# Patient Record
Sex: Female | Born: 1965 | Race: White | Hispanic: No | Marital: Married | Smoking: Never smoker
Health system: Southern US, Community
[De-identification: ages and names within clinical notes are randomized; demographics above are authoritative.]

## PROBLEM LIST (undated history)

## (undated) DIAGNOSIS — D649 Anemia, unspecified: Secondary | ICD-10-CM

## (undated) DIAGNOSIS — R011 Cardiac murmur, unspecified: Secondary | ICD-10-CM

## (undated) DIAGNOSIS — Z8679 Personal history of other diseases of the circulatory system: Secondary | ICD-10-CM

## (undated) DIAGNOSIS — Z87440 Personal history of urinary (tract) infections: Secondary | ICD-10-CM

## (undated) DIAGNOSIS — Z332 Encounter for elective termination of pregnancy: Secondary | ICD-10-CM

## (undated) DIAGNOSIS — O021 Missed abortion: Secondary | ICD-10-CM

## (undated) DIAGNOSIS — I10 Essential (primary) hypertension: Secondary | ICD-10-CM

## (undated) DIAGNOSIS — E039 Hypothyroidism, unspecified: Secondary | ICD-10-CM

## (undated) DIAGNOSIS — I7789 Other specified disorders of arteries and arterioles: Secondary | ICD-10-CM

## (undated) HISTORY — PX: DILATION AND CURETTAGE OF UTERUS: SHX78

## (undated) HISTORY — DX: Personal history of urinary (tract) infections: Z87.440

## (undated) HISTORY — DX: Hypothyroidism, unspecified: E03.9

## (undated) HISTORY — DX: Essential (primary) hypertension: I10

## (undated) HISTORY — DX: Encounter for elective termination of pregnancy: Z33.2

## (undated) HISTORY — DX: Other specified disorders of arteries and arterioles: I77.89

## (undated) HISTORY — DX: Anemia, unspecified: D64.9

## (undated) HISTORY — DX: Missed abortion: O02.1

## (undated) HISTORY — PX: UPPER GASTROINTESTINAL ENDOSCOPY: SHX188

## (undated) HISTORY — DX: Cardiac murmur, unspecified: R01.1

## (undated) HISTORY — PX: ENDOMETRIAL ABLATION: SHX621

## (undated) HISTORY — DX: Personal history of other diseases of the circulatory system: Z86.79

## (undated) HISTORY — PX: ENDOSCOPIC VEIN LASER TREATMENT: SHX1508

## (undated) HISTORY — PX: COLONOSCOPY: SHX174

---

## 2001-05-15 ENCOUNTER — Other Ambulatory Visit: Admission: RE | Admit: 2001-05-15 | Discharge: 2001-05-15 | Payer: Self-pay | Admitting: Gynecology

## 2002-06-25 ENCOUNTER — Encounter (INDEPENDENT_AMBULATORY_CARE_PROVIDER_SITE_OTHER): Payer: Self-pay | Admitting: Specialist

## 2002-06-25 ENCOUNTER — Ambulatory Visit (HOSPITAL_COMMUNITY): Admission: RE | Admit: 2002-06-25 | Discharge: 2002-06-25 | Payer: Self-pay | Admitting: Gynecology

## 2003-01-22 ENCOUNTER — Other Ambulatory Visit: Admission: RE | Admit: 2003-01-22 | Discharge: 2003-01-22 | Payer: Self-pay | Admitting: Gynecology

## 2003-08-04 ENCOUNTER — Encounter (INDEPENDENT_AMBULATORY_CARE_PROVIDER_SITE_OTHER): Payer: Self-pay | Admitting: *Deleted

## 2003-08-04 ENCOUNTER — Inpatient Hospital Stay (HOSPITAL_COMMUNITY): Admission: RE | Admit: 2003-08-04 | Discharge: 2003-08-08 | Payer: Self-pay | Admitting: Gynecology

## 2003-09-17 ENCOUNTER — Other Ambulatory Visit: Admission: RE | Admit: 2003-09-17 | Discharge: 2003-09-17 | Payer: Self-pay | Admitting: Gynecology

## 2004-07-11 DIAGNOSIS — Z8679 Personal history of other diseases of the circulatory system: Secondary | ICD-10-CM

## 2004-07-11 DIAGNOSIS — Z87440 Personal history of urinary (tract) infections: Secondary | ICD-10-CM

## 2004-07-11 HISTORY — DX: Personal history of other diseases of the circulatory system: Z86.79

## 2004-07-11 HISTORY — PX: OTHER SURGICAL HISTORY: SHX169

## 2004-07-11 HISTORY — DX: Personal history of urinary (tract) infections: Z87.440

## 2004-11-09 ENCOUNTER — Other Ambulatory Visit: Admission: RE | Admit: 2004-11-09 | Discharge: 2004-11-09 | Payer: Self-pay | Admitting: Gynecology

## 2005-03-30 ENCOUNTER — Ambulatory Visit (HOSPITAL_BASED_OUTPATIENT_CLINIC_OR_DEPARTMENT_OTHER): Admission: RE | Admit: 2005-03-30 | Discharge: 2005-03-30 | Payer: Self-pay | Admitting: Gynecology

## 2006-03-17 ENCOUNTER — Other Ambulatory Visit: Admission: RE | Admit: 2006-03-17 | Discharge: 2006-03-17 | Payer: Self-pay | Admitting: Gynecology

## 2007-05-08 ENCOUNTER — Other Ambulatory Visit: Admission: RE | Admit: 2007-05-08 | Discharge: 2007-05-08 | Payer: Self-pay | Admitting: Gynecology

## 2008-08-14 ENCOUNTER — Other Ambulatory Visit: Admission: RE | Admit: 2008-08-14 | Discharge: 2008-08-14 | Payer: Self-pay | Admitting: Gynecology

## 2008-08-14 ENCOUNTER — Ambulatory Visit: Payer: Self-pay | Admitting: Gynecology

## 2008-08-14 ENCOUNTER — Encounter: Payer: Self-pay | Admitting: Gynecology

## 2010-01-07 ENCOUNTER — Other Ambulatory Visit: Admission: RE | Admit: 2010-01-07 | Discharge: 2010-01-07 | Payer: Self-pay | Admitting: Gynecology

## 2010-01-07 ENCOUNTER — Ambulatory Visit: Payer: Self-pay | Admitting: Gynecology

## 2010-11-26 NOTE — H&P (Signed)
NAMEGLENYCE, RANDLE                           ACCOUNT NO.:  [id number]   MEDICAL RECORD NO.:  [id number]                   PATIENT TYPE:   LOCATION:                                       FACILITY:   PHYSICIAN:  Juan H. Lily Peer, M.D.             DATE OF BIRTH:   DATE OF ADMISSION:  06/25/2002  DATE OF DISCHARGE:                                HISTORY & PHYSICAL   CHIEF COMPLAINT:  Vaginal bleeding.   HISTORY:  The patient is a 45 year old gravida 2, para 1 whose last  menstrual period was reportedly March 13, 2002; and currently would be 14-  1/[redacted] weeks gestation.  Estimated date of confinement of December 19, 2002.  The  patient has had several ultrasounds and quantitative beta hCGs since the  commencement of this pregnancy.   The first ultrasound was done on May 09, 2002.  There was a questionable  echogenic area seen in the right adnexa, suspicious for an ectopic (very  early) and by her last menstrual period she would have been eight weeks into  her pregnancy at that point.  She had a follow-up ultrasound on May 17, 2002; at which she would have been nine weeks and two days by the last  menstrual period.  Amniotic fluid was reported to be normal.  The cervix was  long and closed.  The gestational sac was visualized, consistent with less  than 5 weeks, and no fetal pole was seen.  A small amount of fluid was still  seen around the ovaries.  Her follow-up ultrasound after that was on  June 14, 2002 and was screened for viability.  At this point crown rump  rent was indicated of a six-week, three-day pregnancy with a yolk sac seen;  but no cardiac activity.  Too early progressive changes were seen in  comparison to May 17, 2002 scanned.  Today she presented complaining of  some vaginal bleeding for the past  2-3 days, and the crown rump rent was 6.4 mm, consistent with six weeks and  four days and no cardiac activity.  No yolk sac was visualized today.  The  gestational sac was consistent with seven weeks and one day.  All of this is  consistent with evidence of a fetal demise.   Her quantitative beta HCG's had also been followed serially, and had been  going up  -- with one done on May 08, 2002, which was 148.  On May 10, 2002 it went up to 487.  On May 14, 2002 it went up to 5,676; and on  December 5,  57, 218 million International Units per milliliter.   The patient was recently seen for an upper respiratory tract infection.  A  strep culture was negative.  She is currently on a Z-Pak.   PAST MEDICAL HISTORY:  1. She has had a history of hypothyroidism; has been on Synthroid 0.075  mcg     -- followed by Dr. Guadelupe Sabin.  Recent thyroid function tests,     according to the patient, have been within the normal range.  2. She has had one cesarean section in the past.   FAMILY HISTORY:  Father with hypertension.  The patient denies any other  medical problems.   PHYSICAL EXAMINATION:  GENERAL:  A well developed, well nourished female.  HEENT:  Unremarkable.  NECK:  Supple.  Trachea midline.  No carotid bruits.  No thyromegaly.  LUNGS:  Clear to auscultation; no rhonchi or wheezes.  HEART:  Regular rate and rhythm; no murmurs or gallops.  BREASTS:  Not done.  ABDOMEN:  Soft and nontender; without rebound or guarding.  PELVIC:  Os and ureters were within normal limits.  Vagina with evidence of  some dark red blood noted in the vaginal vault. Cervix was closed.  No  active bleeding noted.  Uterus approximately 8-10 week size.  No palpable  adnexal masses.  RECTAL:  Not done.   ASSESSMENT:  A 45 year old gravida 2, para 1 with evidence of first  trimester AB; although progressively increasing quantitative beta HCG  with  the amount of bleeding today and no evidence ever of cardiac activity, with  gestational sac consistent with seven weeks, all this indicative of fetal  demise.  The patient will undergo a D&C tomorrow at  [facility].  Risks, benefits pros and cons of the procedure were discussed, including  infection, bleeding, perforation and instrumentation, or trauma to the  internal organs were discussed.  In the event of uncontrollable hemorrhage,  or should she need a blood transfusion potential risks are: anaphylactic  reaction, hepatitis and AIDS.   BLOOD TYPE:  O positive.   DISPOSITION:  All questions are answered and we will follow accordingly.   PLAN:  The patient is scheduled for [facility] tomorrow, June 25, 2002 at  [facility].                                               [place] H. Lily Peer, M.D.    JHF/MEDQ  D:  06/24/2002  T:  06/24/2002  Job:  [id number]

## 2010-11-26 NOTE — Op Note (Signed)
   NAME:  Erica Hull, Erica Hull                         ACCOUNT NO.:  [id number]   MEDICAL RECORD NO.:  [id number]                   PATIENT TYPE:  AMB   LOCATION:  SDC                                  FACILITY:  [facility]   PHYSICIAN:  Juan H. Lily Peer, M.D.             DATE OF BIRTH:  1966-02-08   DATE OF PROCEDURE:  DATE OF DISCHARGE:                                 OPERATIVE REPORT   PREOPERATIVE DIAGNOSIS:  First trimester missed abortion.   POSTOPERATIVE DIAGNOSIS:  First trimester missed abortion.   PROCEDURE:  Dilatation and evacuation.   SURGEON:  Juan H. Lily Peer, M.D.   ANESTHESIA:  Spinal.   INDICATIONS:  The patient is a 45 year old gravida 2, para 1 with first  trimester missed AB.   DESCRIPTION OF PROCEDURE:  After the patient was adequately counseled, she  was taken to the operating room, where she successfully underwent a spinal  anesthesia.  Her vagina and perineum were prepped and draped in the usual  sterile fashion.  Examination under anesthesia demonstrated 10-12 week size  uterus with no palpable adnexal masses.  The bladder was evacuated of its  contents of approximately 50 cc with a red rubber Roxan Hockey.  Next, 2% Xylocaine with 1:100,000 epinephrine was utilized for paracervical  block.  The lidocaine was infiltrated into the 2, 4 8 and 10 o'clock  positions for a total of 10 cc.  The cervix required minimal dilatation.  A  10 mm curet was introduced into the intrauterine cavity, and the  intrauterine cavity was suction curettaged.  It was then changed with a  blunt curet to make sure all that all retained products of conception were  removed.  The single-tooth tenaculum was removed, and the patient was  transferred to recovery with staple vital signs.  She did receive 30 mg of  Toradol on her route to the recovery room.  Blood loss was less than 50 cc  and IV fluids was 500 cc of lactated Ringers.  She did receive prophylactic  antibiotics of 1 g IV, and her  blood type is O positive.                                               Juan H. Lily Peer, M.D.    JHF/MEDQ  D:  06/25/2002  T:  06/25/2002  Job:  [id number]

## 2010-11-26 NOTE — Op Note (Signed)
Erica Hull, Erica Hull               ACCOUNT NO.:  [id number]   MEDICAL RECORD NO.:  [id number]          PATIENT TYPE:  AMB   LOCATION:  N[facility]C                         [facility]ACILITY:  [facility]   PHYSICIAN:  Juan H. Lily Peer, M.D.DATE OF BIRTH:  10-22-1965   DATE OF PROCEDURE:  03/30/2005  DATE OF DISCHARGE:                                 OPERATIVE REPORT   SURGEON:  Juan H. Lily Peer, M.D.   INDICATIONS FOR OPERATION:  A 45 year old, gravida 5, para 2, AB 3 who is  scheduled to undergo endometrial ablation secondary to dysmenorrhea and  cramping during menses. Patient with prior endometrial biopsy being benign  and also patient with prior tubal sterilization procedure.   PREOPERATIVE DIAGNOSIS:  Menorrhagia.   POSTOPERATIVE DIAGNOSIS:  Menorrhagia.   ANESTHESIA:  General endotracheal anesthesia.   PROCEDURE PERFORMED:  Hydrothermal ablation/endometrial ablation technique.   DESCRIPTION OF OPERATION:  After the patient was adequately counseled, she  was taken to the operating room where she underwent a successful general  endotracheal anesthesia. She was placed in low lithotomy position. The  vagina and perineum were prepped and draped in the usual sterile fashion. A  red rubber Roxan Hockey was inserted to evacuate the bladder of its contents for  approximately 50 mL. Exam under anesthesia demonstrated slightly anteverted  uterus normal size and no palpable adnexal masses. A speculum was inserted  into the vaginal vault, single tooth tenaculum was placed onto the anterior  cervical lip. The uterus sounded to approximately 7 cm. Pratt dilators to a  size 23 were utilized to dilate the cervix in an effort to allow the [place]  scientific hydrothermal ablation/hysteroscopic instrumentation to be  introduced into the endocervical canal and into the endometrial cavity. The  first 3 minutes was a clearing phase removing any lush endometrium from the  cavity. Both tubal ostia were identified as well  as the cervical canal. She  did have a very small myoma impinging into the cavity of the right cornual  region. The temperature was raised to 90 degrees for 10 minutes to complete  the ablation and this was followed by a 3-minute cooling phase. Pre and post  ablation pictures were obtained. A set or copy will be kept in the patient's  record here at [facility] and a second set will go to the  patient's record at [facility]. The patient did  receive a gram of cefotetan for prophylaxis. Although she denied any  allergies, she appeared to have a mild rash. She was given Benadryl 25  milligrams IV and will monitor closely in the recovery room. No other  complications noted and she is given 30 milligrams of Toradol on route to  the recovery room.      Juan H. Lily Peer, M.D.  Electronically Signed    JHF/MEDQ  D:  03/30/2005  T:  03/30/2005  Job:  [id number]

## 2010-11-26 NOTE — H&P (Signed)
Erica Hull, Erica Hull               ACCOUNT NO.:  [id number]   MEDICAL RECORD NO.:  [id number]          PATIENT TYPE:  AMB   LOCATION:  NESC                         FACILITY:  [facility]   PHYSICIAN:  Juan H. Lily Peer, M.D.DATE OF BIRTH:  [birth year]   DATE OF ADMISSION:  03/30/2005  DATE OF DISCHARGE:                                HISTORY & PHYSICAL   CHIEF COMPLAINT:  Menorrhagia.   HISTORY:  Patient is a 45 year old gravida 5, para 2, AB 3 who was seen in  the office on September 14th for preoperative consultation.  The patient is  scheduled to undergo an endometrial ablation on Wednesday, September 20th at  1 p.m. due to her history of menorrhagia lasting 7 or more days with a  significant amount of cramping and passing large quantities of blood clots.  Patient had undergone an endometrial biopsy in the office on September 14th,  which the pathological report was __________ secretory-type endometrium.  Her recent Pap smear on Nov 14, 2004 was normal.  Since she has a history of  hypothyroidism, TSH was checked as well as were cholesterol, urinalysis, and  CBC, which were all normal.  Her platelet count was 170,000.  Patient has a  history of bilateral tubal sterilization procedure at the time of her last  cesarean section.   PAST MEDICAL HISTORY:  Patient denies any allergies.  She has had two  elective abortions, two cesarean sections, and one missed AB.  She has a  history of hypothyroidism for which she is on Synthroid and has been  followed by Dr. Kirby Funk.   FAMILY HISTORY:  Significant for hypertension in her father.   PHYSICAL EXAMINATION:  VITAL SIGNS:  Patient weighs 166 pounds.  Height 5  feet 6 inches tall.  Blood pressure 110/78, pulse 76.  HEENT:  Unremarkable.  NECK:  Supple.  Trachea is midline.  No carotid bruits.  No thyromegaly.  LUNGS:  Clear to auscultation without rhonchi or wheezes.  HEART:  Regular rate and rhythm.  No murmurs or gallops.  BREASTS:  Not  done.  ABDOMEN:  Soft and nontender without rebound or guarding.  PELVIC:  Bartholin's, urethra, and Skene's glands within normal limits.  Vagina and cervix:  No gross lesions on inspection.  Uterus:  Anteverted,  normal size, shape, and consistency.  Adnexa without masses or tenderness.  RECTAL:  Not done.   ASSESSMENT:  A 45 year old gravida 5, para 2, AB 3 with severe menorrhagia  and cramping.  Previous tubal ligation and recent endometrial biopsy being  benign.  Normal platelets.  Normal hemoglobin and hematocrit.  Patient has  been offered different treatment modalities, and she would like to proceed  with endometrial ablation.  She had been provided with literature and  information at the time of her annual examination on May 2nd, and then we  discussed it on September 14th when she presented for the preoperative  consultation.  The risks, benefits, pro's and con's of the procedure were  discussed with the patient in detail, despite having done endometrial biopsy  and a recent normal Pap smear, there  is always the remote possibility there  could be some remote focus of cancer developing that could be ablating and  not developed until later, but as far as the endometrial biopsy, it was  benign.  The patient is  fully aware of that small risk.  All questions are answered.  The patient  has received Provera 10 mg x10 days prior to her surgery.  All questions are  answered and will follow accordingly.  Patient scheduled for hydrothermal  ablation on Wednesday, September 20th at 1 p.m. for her menometrorrhagia at  [facility].      Juan H. Lily Peer, M.D.  Electronically Signed     JHF/MEDQ  D:  03/29/2005  T:  03/29/2005  Job:  [id number]

## 2010-11-26 NOTE — H&P (Signed)
NAME:  ANANDA, CAYA                         ACCOUNT NO.:  [id number]   MEDICAL RECORD NO.:  [id number]                   PATIENT TYPE:  INP   LOCATION:  NA                                   FACILITY:  [facility]   PHYSICIAN:  Juan H. Lily Peer, M.D.             DATE OF BIRTH:  1966-03-30   DATE OF ADMISSION:  08/04/2003  DATE OF DISCHARGE:                                HISTORY & PHYSICAL   CHIEF COMPLAINT:  Term intrauterine pregnancy at 38-1/2 weeks estimated  gestational age, previous history of cesarean section for elective repeat,  and request for elective permanent sterilization.  Also a history of  hypothyroidism.   HISTORY OF PRESENT ILLNESS:  The patient is a 45 year old gravida 5, para 1,  abortus 3, who will be 38-1/2 to [redacted] weeks gestation at the time of her  cesarean section on Monday, August 04, 2003.  The patient's prenatal course  has been significant for the fact that due to advanced maternal age, she had  undergone a genetic amniocentesis at approximately [redacted] weeks gestation and  had a normal female chromosome of 64 XX and normal amniotic fluid and alpha  fetoprotein.  She also had a normal cystic fibrosis screen and a negative  GBS culture and she had been tested for her TSH secondary to her  hypothyroidism at approximately 28 weeks which was in the normal range, but  she has continued on the same dose of 75 mcg daily.   PAST MEDICAL HISTORY:  She denies any allergies.  Has history of  hypothyroidism.  Advanced maternal age.  Previous cesarean section secondary  to breech in 1998.  She says she had labile pregnancy-induced hypertension  in the last pregnancy and history of hypothyroidism on Synthroid 75 mcg  daily.  She has had D&C's in the past x3 and one cesarean section.   REVIEW OF SYSTEMS:  See [place] form.   PHYSICAL EXAMINATION:  VITAL SIGNS:  Blood pressure 120/78, urine trace  protein and negative glucose, weight 205 pounds.  HEENT:  Unremarkable.  NECK:  Supple.  Trachea midline.  No carotid bruits and no thyromegaly.  LUNGS:  Clear to auscultation without rhonchi or wheezes.  HEART:  Regular rate and rhythm with no murmurs or gallops.  BREASTS:  Done during the first prenatal visit was reported to be normal.  ABDOMEN:  Gravid uterus, vertex presentation by Thayer Ohm maneuver.  Positive  fetal heart tones.  PELVIC:  Cervix was long, closed, and posterior.   PRENATAL LABORATORY DATA:  O positive blood type, negative antibody screen,  VDRL nonreactive, rubella immune.  Hepatitis B surface antigen and HIV were  negative.  Diabetes screen was normal.  Amniotic fluid alpha fetoprotein was  normal.  Genetic amniocentesis 69 XX and GBS culture was negative.   ASSESSMENT:  A 45 year old gravida 5, para 1, abortus 3, at 38-1/2 to [redacted]  weeks gestation with previous cesarean section, requesting  elective repeat.  Also the patient is requesting elective permanent sterilization.  The risks,  benefits, pros, and cons of repeat cesarean section versus trial of labor  have previously been discussed and an ACOG guidelines has been discussed as  well.  The patient had voiced a request for elective sterilization for which  she was counseled as to 1 in 600 to 1 in 1000 failure rates.  The patient is  fully aware and would like to proceed with it.  Although, she is fully aware  that this is a permanent form of sterilization and will not be able to have  anymore children.  Both procedures were discussed.  Additional risks  including infection, bleeding, trauma to internal organs, need for blood and  blood products with the potential risk of anaphylactic reaction, HIV, and  hepatitis were also discussed.  She will receive prophylactic antibiotics  immediately after the cesarean section.  All of these issues were discussed,  all questions were answered and will follow accordingly.   PLAN:  The patient is scheduled for repeat cesarean section with bilateral   tubal ligation procedure on Monday January 24, at 1 p.m. at [facility].                                               [place] H. Lily Peer, M.D.    JHF/MEDQ  D:  07/31/2003  T:  07/31/2003  Job:  [id number]

## 2010-11-26 NOTE — Discharge Summary (Signed)
NAME:  Erica Hull, Erica Hull                         ACCOUNT NO.:  [id number]   MEDICAL RECORD NO.:  [id number]                   PATIENT TYPE:  INP   LOCATION:  9135                                 FACILITY:  [facility]   PHYSICIAN:  Juan H. Lily Peer, M.D.             DATE OF BIRTH:  04/10/1966   DATE OF ADMISSION:  08/04/2003  DATE OF DISCHARGE:  08/08/2003                                 DISCHARGE SUMMARY   DISCHARGE DIAGNOSES:  1. 38+ weeks delivery.  2. History of prior Cesarean section.  3. Desired repeat Cesarean section with request for elective permanent     sterilization.  4. Advanced maternal age.  5. Hyperthyroidism.  6. Status post repeat lower uterine segment transverse Cesarean section and     bilateral tubal sterilization procedure, Pomeroy technique, by Dr. Reynaldo Minium on August 04, 2003.   HISTORY:  This is a 45 year old female, gravida 5, para 1, abortus 3, with  an EDC of August 18, 2003. Prenatal course was complicated by advanced  maternal age. She underwent genetic amniocentesis which was normal. She also  has hyperthyroidism, on Synthroid. The patient has a history of prior  Cesarean section, desired repeat Cesarean section, and desired attempted  permanent sterilization.   HOSPITAL COURSE:  On August 04, 2003, the patient was admitted and  underwent a repeat lower uterine segment transverse Cesarean section,  bilateral tubal sterilization, by Pomeroy technique by Dr. Reynaldo Minium  without complications with delivery of female with Apgars 9 and 9, weight 6  pounds 15 ounces. There were no complications. Postoperatively, the patient  remained afebrile, voiding, and in stable condition. She was discharged to  home in satisfactory condition on August 08, 2003. It was noted that the  baby had been transferred to NICU, however, for pneumonia and was stable at  the time of the patient's discharge.   CLINICAL FINDINGS AND LABORATORY:  The patient is O positive,  Rubella  immune. On August 05, 2003, hemoglobin was 11.3.   DISPOSITION:  The patient is discharged to home, return to the office in six  weeks; to be seen sooner if any problems.  The patient is given a  prescription for Tylox p.r.n. pain.     Susa Loffler, P.A.                    Juan H. Lily Peer, M.D.    TSG/MEDQ  D:  09/15/2003  T:  09/15/2003  Job:  [id number]

## 2010-11-26 NOTE — Op Note (Signed)
NAME:  Erica Hull, Erica Hull                         ACCOUNT NO.:  [id number]   MEDICAL RECORD NO.:  [id number]                   PATIENT TYPE:  INP   LOCATION:  9198                                 FACILITY:  [facility]   PHYSICIAN:  Juan H. Lily Peer, M.D.             DATE OF BIRTH:  13-Jun-1966   DATE OF PROCEDURE:  08/04/2003  DATE OF DISCHARGE:                                 OPERATIVE REPORT   PREOPERATIVE DIAGNOSES:  1. Term intrauterine pregnancy.  2. Previous cesarean section, requesting elective repeat.  3. Request for elective permanent sterilization.   POSTOPERATIVE DIAGNOSES:  1. Term intrauterine pregnancy.  2. Previous cesarean section, requesting elective repeat.  3. Request for elective permanent sterilization.   SURGEON:  Juan H. Lily Peer, M.D.   FIRST ASSISTANT:  Timothy P. Fontaine, M.D.   ANESTHESIA:  Spinal.   PROCEDURES PERFORMED:  1. Repeat lower uterine segment transverse cesarean section.  2. Bilateral tubal sterilization procedure, Pomeroy technique.   INDICATIONS FOR PROCEDURE:  A 45 year old gravida 5, para 1, AB3 at 38-1/2  weeks estimated gestational age with previous cesarean section requesting  elective repeat.  The patient also requesting elective permanent  sterilization.   FINDINGS:  A viable female infant.  Apgars are 9 and 9 with a weight of 6  pounds 15 ounces, normal maternal pelvic anatomy.  Clear amniotic fluid.  Arterial cord pH was 7.26.   DESCRIPTION OF PROCEDURE:  After the patient was adequately counseled, she  was taken to the operating room where she underwent a successful spinal  placement.  The abdomen was prepped and draped in the usual sterile fashion.  A Foley catheter was inserted in an effort to monitor urinary output.  The  Pfannenstiel skin incision was made adjacent to the previous Pfannenstiel  scar.  The incision was carried out from the skin and subcutaneous tissue  down to the rectus fascia whereby midline nick was made.   The fascia was  incised in a transverse fashion.  The peritoneal cavity was entered  cautiously.  The bladder flap was established.  The lower uterine segment  was incised in a transversalis fascia.  Clear amniotic fluid was present.  The newborn's head was delivered with the assistance of a Mityvac vacuum  with minimal traction to assist getting the head through the small uterine  incision.  The nasopharyngeal area was bulb suctioned.  The newborn was  delivered and gave immediate cry.  The cord was doubly clamped and excised  and passed off to the pediatricians who were in attendance with the above  mentioned parameters.  Cord blood was obtained and the placenta was  delivered from the intrauterine cavity after the cord blood was obtained and  passed off the operative field.  The Pitocin drip was started.  The patient  received a gram of Cefotan IV.  The uterus was then exteriorized.  The  intrauterine cavity was swept clear of  remaining products of conception.  The lower uterine segment transverse incision was closed with a running  locking stitch of 0 Vicryl  suture.  Attention was then placed to the  proximal one third portion of the right fallopian tube which was placed in  traction with a Babcock clamp.  A 2 cm segment was suture ligated with 1-0  Vicryl suture x2 and a 2 cm segment was transected and passed off the  operative field for histologic evaluation.  the remaining stumps were Bovie  cauterized.  Similar procedure was carried out on the contralateral side.  The uterus was placed back into the pelvic cavity.  The pelvic cavity was  copiously irrigated with normal saline solution.  After ascertaining  adequate hemostasis, the closure was commenced.  Of note, the visceral  peritoneum was not reapproximated. The fascia was closed with a running  locking stitch of 0 Vicryl suture. The subcutaneous bleeders were Bovie  cauterized.  The skin was reapproximated with skin clips  followed by  placement of Xeroform gauze and 4x dressing.  The patient was transferred to  the recovery room with stable vital signs.  Blood loss for the procedure was  1000 mL.  IV fluids were 400 mL of lactated Ringer's. Urine output was 125  mL and she had received 1 g of Cefotan prophylactically.                                               Juan H. Lily Peer, M.D.    JHF/MEDQ  D:  08/04/2003  T:  08/04/2003  Job:  [id number]

## 2011-03-11 ENCOUNTER — Encounter: Payer: Self-pay | Admitting: Gynecology

## 2011-04-13 ENCOUNTER — Other Ambulatory Visit: Payer: Self-pay | Admitting: Gynecology

## 2011-04-20 ENCOUNTER — Encounter: Payer: Self-pay | Admitting: Gynecology

## 2011-04-27 ENCOUNTER — Encounter: Payer: Self-pay | Admitting: Gynecology

## 2011-05-13 ENCOUNTER — Encounter: Payer: Self-pay | Admitting: Gynecology

## 2011-05-20 ENCOUNTER — Encounter: Payer: Self-pay | Admitting: Gynecology

## 2011-05-20 ENCOUNTER — Other Ambulatory Visit (HOSPITAL_COMMUNITY)
Admission: RE | Admit: 2011-05-20 | Discharge: 2011-05-20 | Disposition: A | Discharge status: Home / Self Care | Payer: Managed Care, Other (non HMO) | Source: Ambulatory Visit | Attending: Gynecology | Admitting: Gynecology

## 2011-05-20 ENCOUNTER — Ambulatory Visit (INDEPENDENT_AMBULATORY_CARE_PROVIDER_SITE_OTHER): Payer: Managed Care, Other (non HMO) | Admitting: Gynecology

## 2011-05-20 VITALS — BP 124/82 | Ht 66.0 in | Wt 190.0 lb

## 2011-05-20 DIAGNOSIS — N920 Excessive and frequent menstruation with regular cycle: Secondary | ICD-10-CM

## 2011-05-20 DIAGNOSIS — N946 Dysmenorrhea, unspecified: Secondary | ICD-10-CM

## 2011-05-20 DIAGNOSIS — Z01419 Encounter for gynecological examination (general) (routine) without abnormal findings: Secondary | ICD-10-CM | POA: Insufficient documentation

## 2011-05-20 DIAGNOSIS — R635 Abnormal weight gain: Secondary | ICD-10-CM

## 2011-05-20 DIAGNOSIS — E039 Hypothyroidism, unspecified: Secondary | ICD-10-CM

## 2011-05-20 NOTE — Progress Notes (Signed)
Erica Hull Apr 21, 1966 [id number]   History:    45 y.o.  for annual exam with complaint of menorrhagia her cycles lasting 7-8 days and very heavy the first few days. Review of her record indicated she's had a bilateral tubal sterilization procedure in the past and 2006 she had a hydrothermal ablation. Her mammogram was in August of this year was normal she doesn't monthly self breast examination.  Past medical history,surgical history, family history and social history were all reviewed and documented in the EPIC chart.  Gynecologic History Patient's last menstrual period was 05/04/2011. Contraception: tubal ligation Last Pap: 2011. Results were:normal} Last mammogram: 2012. Results were: normal  Obstetric History OB History    Grav Para Term Preterm Abortions TAB SAB Ect Mult Living   4 2 2  2     2      # Outc Date GA Lbr Len/2nd Wgt Sex Del Anes PTL Lv   1 TRM     M CS  No Yes   2 TRM     F CS  No Yes   3 ABT            4 ABT                ROS:  Was performed and pertinent positives and negatives are included in the history.  Exam: chaperone present  BP 124/82  Ht 5\' 6"  (1.676 m)  Wt 190 lb (86.183 kg)  BMI 30.67 kg/m2  LMP 05/04/2011  Body mass index is 30.67 kg/(m^2).  General appearance : Well developed well nourished female. No acute distress HEENT: Neck supple, trachea midline, no carotid bruits, no thyroidmegaly Lungs: Clear to auscultation, no rhonchi or wheezes, or rib retractions  Heart: Regular rate and rhythm, no murmurs or gallops Breast:Examined in sitting and supine position were symmetrical in appearance, no palpable masses or tenderness,  no skin retraction, no nipple inversion, no nipple discharge, no skin discoloration, no axillary or supraclavicular lymphadenopathy Abdomen: no palpable masses or tenderness, no rebound or guarding Extremities: no edema or skin discoloration or tenderness  Pelvic:  Bartholin, Urethra, Skene Glands: Within normal  limits             Vagina: No gross lesions or discharge  Cervix: No gross lesions or discharge  Uterus  anteverted, normal size, shape and consistency, non-tender and mobile  Adnexa  Without masses or tenderness  Anus and perineum  normal   Rectovaginal  normal sphincter tone without palpated masses or tenderness             Hemoccult not done     Assessment/Plan:  45 y.o. female for annual exam with complaint of menorrhagia lasting 7 days for a few days very heavy. Patient with prior hydrothermal ablation. Patient will return to the office next week we can do of sonohysterogram if her intrauterine cavity is not agglutinated we may want to consider an in office her option endometrial ablation. Literature formation was provided. If this is not obtainable general options would be hormone manipulation or hysterectomy. Her Pap smear was done today along with CBC cholesterol random blood sugar TSH and UA. She's currently on Synthroid 75 mcg daily. We'll see her next week and plan a course of management accordingly.    Ok Edwards MD, 3:18 PM 05/20/2011

## 2011-05-25 ENCOUNTER — Telehealth: Payer: Self-pay

## 2011-05-25 NOTE — Telephone Encounter (Signed)
Patient is scheduled for [facility] 11/20. She asked if Dr. Glenetta Hew recommends hysterectomy would she be able to get it done before Jan 1.  I told her I thought we would be able to fit her in in December.  She asked about recovery for TLH. I told her 2 weeks optimal with sedentary job and 4 weeks possible.  She works from home on computer and could pace herself and I told her two weeks was likely.  I told her if Vag Hyst it may be more like 4 week recovery but she could still start doing some things at home toward working at 2 weeks if she paces herself carefully.  ka

## 2011-05-31 ENCOUNTER — Other Ambulatory Visit: Payer: Self-pay | Admitting: Gynecology

## 2011-05-31 ENCOUNTER — Ambulatory Visit (INDEPENDENT_AMBULATORY_CARE_PROVIDER_SITE_OTHER): Payer: Managed Care, Other (non HMO) | Admitting: Gynecology

## 2011-05-31 ENCOUNTER — Ambulatory Visit (INDEPENDENT_AMBULATORY_CARE_PROVIDER_SITE_OTHER): Payer: Managed Care, Other (non HMO)

## 2011-05-31 DIAGNOSIS — N920 Excessive and frequent menstruation with regular cycle: Secondary | ICD-10-CM

## 2011-05-31 DIAGNOSIS — N831 Corpus luteum cyst of ovary, unspecified side: Secondary | ICD-10-CM

## 2011-05-31 DIAGNOSIS — N83201 Unspecified ovarian cyst, right side: Secondary | ICD-10-CM

## 2011-05-31 DIAGNOSIS — N83209 Unspecified ovarian cyst, unspecified side: Secondary | ICD-10-CM

## 2011-05-31 DIAGNOSIS — D259 Leiomyoma of uterus, unspecified: Secondary | ICD-10-CM

## 2011-05-31 DIAGNOSIS — D251 Intramural leiomyoma of uterus: Secondary | ICD-10-CM

## 2011-05-31 HISTORY — DX: Intramural leiomyoma of uterus: D25.1

## 2011-05-31 NOTE — Progress Notes (Signed)
Patient presented to the office today for her evaluation of her menorrhagia. Patient states her cycles are lasting 7-8 days and very heavy for the first few days. Patient with prior history of bilateral tubal sterilization as well as having had a hydrothermal ablation 2006. She presented today for sonohysterogram to see if there is enough room in the cavity to allow to do the her option endometrial ablation were not to proceed with hysterectomy.  Ultrasound: Ultrasound with the following dimensions 11 x 8 x 6.8 cm with an endometrial stripe of 8.4 mm she had a fundal fibroid measured 44 x 31 mm and second fibroid measuring 25 x 23 mm and the third one measuring 19 x 17 mm. She had a small ovarian cyst measuring 24 x 21 x 20 mm on the right the left ovary was was normal. Sonohysterogram demonstrated no intracavitary defect and thin.  Finding discussed with Erica Hull and she would be a good candidate for a her option endometrial ablation here in the office. Literature formation was provided risks benefits and pros and cons were discussed. This was saver for hysterectomy. An endometrial biopsy was done today. Tissue submitted for histological evaluation. Will await after this period starts in place her on Prometrium 200 mg daily for 2 weeks before the planned procedure. We'll see her in the office the day before for preoperative counseling as well as for placement of laminaria intracervically.

## 2011-06-01 ENCOUNTER — Telehealth: Payer: Self-pay

## 2011-06-01 MED ORDER — PROGESTERONE MICRONIZED 200 MG PO CAPS: ORAL_CAPSULE | ORAL | Status: DC

## 2011-06-01 NOTE — Telephone Encounter (Signed)
Pt informed $25 copymt for Her Option Ablation in office. Patient expects menses any day now. We scheduled ablation for Wed Dec 5 10:00am. She will come in the afternoon before for laminaria/preop appt with Dr. Glenetta Hew.  She was instructed regarding full bladder am of procedure as well as someone needs to drive her to and from procedure.  She was instructed regarding beginning Prometrium on Day 5 of her cycle and that was e-scribed to her pharmacy for her.  All these dates and full bladder instructions also were mailed to patient.  She will call me if any questions.

## 2011-06-01 NOTE — Telephone Encounter (Signed)
I spoke with Erica Hull at [facility] today and she said Her Option Ablation CPT code 40981 is covered at 100% after a $25 copymt.  No precertification is required.  I called patient and left msg at work voice mail to call me regarding ins benefits/scheduling. ka

## 2011-06-09 ENCOUNTER — Other Ambulatory Visit: Payer: Self-pay | Admitting: Gynecology

## 2011-06-09 DIAGNOSIS — N920 Excessive and frequent menstruation with regular cycle: Secondary | ICD-10-CM

## 2011-06-14 ENCOUNTER — Encounter: Payer: Self-pay | Admitting: Gynecology

## 2011-06-14 ENCOUNTER — Ambulatory Visit (INDEPENDENT_AMBULATORY_CARE_PROVIDER_SITE_OTHER): Payer: Managed Care, Other (non HMO) | Admitting: Gynecology

## 2011-06-14 VITALS — BP 130/80

## 2011-06-14 DIAGNOSIS — Z01818 Encounter for other preprocedural examination: Secondary | ICD-10-CM

## 2011-06-14 DIAGNOSIS — N920 Excessive and frequent menstruation with regular cycle: Secondary | ICD-10-CM

## 2011-06-14 MED ORDER — AZITHROMYCIN 500 MG PO TABS: 500.0000 mg | ORAL_TABLET | Freq: Every day | ORAL | Status: AC

## 2011-06-14 MED ORDER — MISOPROSTOL 200 MCG PO TABS: 200.0000 ug | ORAL_TABLET | Freq: Once | ORAL | Status: DC

## 2011-06-14 MED ORDER — HYDROCODONE-ACETAMINOPHEN 5-500 MG PO TABS: 1.0000 | ORAL_TABLET | Freq: Four times a day (QID) | ORAL | Status: AC | PRN

## 2011-06-14 MED ORDER — DIAZEPAM 5 MG PO TABS: 5.0000 mg | ORAL_TABLET | Freq: Four times a day (QID) | ORAL | Status: AC | PRN

## 2011-06-14 NOTE — Patient Instructions (Signed)
You can go by the pharmacy to pick up the remaining prescriptions. Please follow the instructions provided prior to your procedure tomorrow. Remember your procedure scheduled for 10:30 and your to be here at 10 AM.

## 2011-06-14 NOTE — Progress Notes (Signed)
Patient presented to the office today for preoperative consultation in preparation for her office endometrial ablation scheduled for tomorrow. Patient with prior history of hydrothermal ablation in 2006. Recently patient been having periods lasting 10-12 days. Patient was seen the office November 20 see previous note outlining her recent ultrasound. Sonohysterogram had demonstrated no intracavitary defect and thin which would allow [place] to introduce a cryo-probe and offer her the cryo-ablation of the endometrium in the office. We had done an endometrial biopsy with the following result:  FINAL DIAGNOSIS Diagnosis Endometrium, biopsy - SECRETORY PATTERN ENDOMETRIUM. - NO HYPERPLASIA, ATYPIA, OR MALIGNANCY IDENTIFIED Patient with prior tubal sterilization procedure. Patient was counseled as to risk benefits and pros and cons of the operation. On pelvic exam her uterus is anteverted normal size shape and consistency with no palpable adnexal masses.  Prescription was provided for her as follows: Patient is to take Cytotec 200 mcg one by mouth this evening. She'll take Zithromax antibiotic 500 mg in the morning prior to come to the office for her procedure. She was also given a prescription of Valium 5 mg which she can take tonight if needed. She was also given a prescription of Vicodin to take 1 every 4-6 hours when necessary cramps after the procedure. Consent form was signed all questions were answered and we'll follow accordingly.

## 2011-06-15 ENCOUNTER — Ambulatory Visit: Payer: Managed Care, Other (non HMO) | Admitting: Gynecology

## 2011-06-15 ENCOUNTER — Other Ambulatory Visit: Payer: Managed Care, Other (non HMO)

## 2011-06-15 ENCOUNTER — Ambulatory Visit (INDEPENDENT_AMBULATORY_CARE_PROVIDER_SITE_OTHER): Payer: Managed Care, Other (non HMO) | Admitting: Gynecology

## 2011-06-15 ENCOUNTER — Encounter: Payer: Self-pay | Admitting: Gynecology

## 2011-06-15 ENCOUNTER — Ambulatory Visit: Payer: Managed Care, Other (non HMO)

## 2011-06-15 DIAGNOSIS — N920 Excessive and frequent menstruation with regular cycle: Secondary | ICD-10-CM

## 2011-06-15 DIAGNOSIS — N949 Unspecified condition associated with female genital organs and menstrual cycle: Secondary | ICD-10-CM

## 2011-06-15 MED ORDER — LIDOCAINE HCL 1 % IJ SOLN: 10.0000 mL | Freq: Once | INTRAMUSCULAR | Status: DC

## 2011-06-15 MED ORDER — KETOROLAC TROMETHAMINE 30 MG/ML IJ SOLN
60.0000 mg | Freq: Once | INTRAMUSCULAR | Status: AC
  Administered 2011-06-15: 60 mg via INTRAMUSCULAR

## 2011-06-15 MED ORDER — CLINDAMYCIN PHOSPHATE 2 % VA CREA: 1.0000 | TOPICAL_CREAM | Freq: Every day | VAGINAL | Status: AC

## 2011-06-15 NOTE — Progress Notes (Signed)
Patient Name:Erica Hull  Patient MRN: [id number]   Date:06/15/2011   Diagnosis:  Excessive Uterine Bleeding/Menorrhagia  Procedure:  Endometrial cryoablation with intraoperative ultrasonic guidance  Procedure Medications: patient received 60 mg of Toradol IM prior to start of the procedure. She had a paracervical block with 1% lidocaine for a total of 10 cc.   Procedure:  The Patient was brought to the treatment room having previously been counseled for the procedure position and a speculum was inserted.  The cervix and upper vagina were cleaned with Betadine.  A single tooth tenaculum was placed on the anterior lip of the cervix.  A paracervical block was placed per above.  The uterus was sounded to 7 cm.  Cervical dilation was not performed. The laminaria placed the night before was removed.  Under ultrasound guidance, the Her Option probe was introduced into the uterine cavity after the pre procedural sequence was performed.  After assuring proper cornual placement, Cryoablation was then performed under continuous ultrasound guidance monitoring the growth of the cryozone.  Sequential cryoablation were performed in the following order, locations, freeze times and post freeze myometrial depths.           Location of Freeze Length of Time Myometrial Depth/center of uterus  1. 25.2 mm   5 Minutes  15.1 mm 2. 0     0mm 3. 0     0mm 4. 0     0mm  Upon completion of the procedure, the instruments were removed, hemostasis visualized and the patient was assisted to the bathroom and then another exam room where she was observed.  Vitals:   Pre treatment:  Time: 10:15  BP:120/78  P: 72 Post Treatment:  Time: 112:20 BP: 120/80  P:76  A single freeze was utilized to do the fact the patient several years ago had an HTA endometrial ablation and had recent menorrhagia and a very narrow cavity which would allow one single probe freezing. Total freezing time was 7  minutes prior to freeze myometrial thickness was 25.2 mm upon completion of 6 minutes of freezing myometrium thickness was 15.1 mm  The patient tolerated the procedure well and was released in stable condition with her driver along with a copy of the post procedure instruments which were reviewed with her.  She is to return to the office in 2 weeks for a post procedural check.  [facility] HMD10:51 AMTD@

## 2011-06-15 NOTE — Patient Instructions (Signed)
Erica Hull, anticipate be water reproduced discharge for the next 7-10 days. If you develop a fishy light odor I've called in to her pharmacy a prescription for Cleocin vaginal cream to apply intravaginally before bedtime for 5 days. Take your next antibiotic tomorrow morning. You can take the Vicodin 1 every 4-6 hours as needed for cramping. Do not take the Vicodin and Valium at the same time, they should be taking at least 6 hours apart if needed. The procedure were all well and hope this will cut down significantly and amount of bleeding as we talked before. If he any questions before the 2 weeks and not hesitate to call our office and follow the discharge instructions provided.

## 2011-06-16 NOTE — Progress Notes (Signed)
Error

## 2011-06-29 ENCOUNTER — Ambulatory Visit: Payer: Managed Care, Other (non HMO) | Admitting: Gynecology

## 2011-07-15 ENCOUNTER — Ambulatory Visit (INDEPENDENT_AMBULATORY_CARE_PROVIDER_SITE_OTHER): Payer: Managed Care, Other (non HMO) | Admitting: Gynecology

## 2011-07-15 ENCOUNTER — Encounter: Payer: Self-pay | Admitting: Gynecology

## 2011-07-15 VITALS — BP 124/88

## 2011-07-15 DIAGNOSIS — Z9889 Other specified postprocedural states: Secondary | ICD-10-CM

## 2011-07-15 NOTE — Patient Instructions (Signed)
Will see you next November for annual exam. Have a great year!

## 2011-07-15 NOTE — Progress Notes (Signed)
Will patient presented to the office for her postop visit she is status post endometrial ablation via Her Option cryoablation technique done 06/15/2011 as a result of her menorrhagia. The patient in 2006 had a hydrothermal ablation procedure and had done well until recently. Patient is doing well.  Exam: Bartholin urethra Skene glands: Within normal limits Vagina: No gross lesions on inspection Cervix: No gross lesions on inspection Uterus: Anteverted normal size shape and consistency Adnexa: No palpable masses or tenderness Rectal: Not done  Assessment/plan: Patient status post cryoablation of the uterus secondary to menorrhagia doing well. Patient had some light fluid discharge for the first few days it has resolved. Patient will return back to the office November of this year for her annual exam or when necessary.

## 2011-08-01 ENCOUNTER — Other Ambulatory Visit: Payer: Self-pay | Admitting: *Deleted

## 2011-08-01 MED ORDER — LEVOTHYROXINE SODIUM 75 MCG PO TABS: 75.0000 ug | ORAL_TABLET | Freq: Every day | ORAL | Status: DC

## 2012-07-30 ENCOUNTER — Encounter: Payer: Self-pay | Admitting: Gynecology

## 2012-08-02 ENCOUNTER — Encounter: Payer: Self-pay | Admitting: Gynecology

## 2012-08-02 ENCOUNTER — Telehealth: Payer: Self-pay

## 2012-08-02 ENCOUNTER — Ambulatory Visit (INDEPENDENT_AMBULATORY_CARE_PROVIDER_SITE_OTHER): Payer: Managed Care, Other (non HMO) | Admitting: Gynecology

## 2012-08-02 VITALS — BP 134/88

## 2012-08-02 DIAGNOSIS — Z23 Encounter for immunization: Secondary | ICD-10-CM

## 2012-08-02 DIAGNOSIS — M62830 Muscle spasm of back: Secondary | ICD-10-CM | POA: Insufficient documentation

## 2012-08-02 DIAGNOSIS — M5380 Other specified dorsopathies, site unspecified: Secondary | ICD-10-CM

## 2012-08-02 MED ORDER — IBUPROFEN 800 MG PO TABS: 800.0000 mg | ORAL_TABLET | Freq: Three times a day (TID) | ORAL | Status: DC | PRN

## 2012-08-02 MED ORDER — CYCLOBENZAPRINE HCL 10 MG PO TABS: ORAL_TABLET | ORAL | Status: DC

## 2012-08-02 MED ORDER — PREDNISONE (PAK) 10 MG PO TABS: 10.0000 mg | ORAL_TABLET | Freq: Every day | ORAL | Status: DC

## 2012-08-02 NOTE — Telephone Encounter (Signed)
Monday got out of shower and cleared her throat and felt like she pulled a muscle in her back.  Put heat on it and it got better but still nagging.  Laid down last night in her daughter's bed and fell asleep. When she got up this morning she could hardly move due to pain in same spot.  When she takes a deep breath it is a sharp pain. She said it feels muscular and is located mid-lower back at waist.  She said she knows if it does not get better she will have to see someone but wondered if you would be willing to prescribe a muscle relaxant for her to see if she can get it settled down.  (Has her CE scheduled with you 08/24/12.)

## 2012-08-02 NOTE — Progress Notes (Signed)
Patient presented to the office today stating that a few days ago in the morning she got up and coughed and began having low back spasms. She tried taking some analgesics over-the-counter with minimal resolution. She does not having any coughing issues. She denies fever chills nausea or vomiting. She denies any GU or GI complaints. She denies any lower extremity radiculopathy. She is still mobile. She states when she takes a deep breath her lower back is uncomfortable at times.  Exam: There were some point tenderness paraspinals muscle in the lumbosacral region. When patient had third-degree leg lifts cost a little bit of discomfort she had good range of motion good flexion and extension.  Assessment/plan: Lumbosacral strain for/muscle spasm unresponsive to over-the-counter analgesic. Patient will be started on a prednisone (Sterapred uni pack) 10 mg to take over a six-day course. She'll be prescribed Flexeril as a muscle relaxant 10 mg 1 by mouth twice a day for 5-7 days. Also nonsteroidal Motrin 800 mg to take 1 by mouth 3 times a day for the next 5-7 days with meals. She can use heating pad if she likes to 3 times a day. If no improvement in the next several days with above regimen she will contact the office for imaging studies and possible referral to orthopedics. Patient was requesting a flu vaccine and received it today.

## 2012-08-02 NOTE — Patient Instructions (Addendum)
Inactivated Influenza Vaccine What You Need to Know WHY GET VACCINATED?  Influenza ("flu") is a contagious disease.  It is caused by the influenza virus, which can be spread by coughing, sneezing, or nasal secretions.  Anyone can get influenza, but rates of infection are highest among children. For most people, symptoms last only a few days. They include:  Fever or chills.  Sore throat.  Muscle aches.  Fatigue.  Cough.  Headache.  Runny or stuffy nose. Other illnesses can have the same symptoms and are often mistaken for influenza. Young children, people 65 and older, pregnant women, and people with certain health conditions, such as heart, lung or kidney disease, or a weakened immune system can get much sicker. Flu can cause high fever and pneumonia, and make existing medical conditions worse. It can cause diarrhea and seizures in children. Each year thousands of people die from influenza and even more require hospitalization. By getting flu vaccine, you can protect yourself from influenza and may also avoid spreading influenza to others. INACTIVATED INFLUENZA VACCINE There are 2 types of influenza vaccine:  Inactivated (killed) vaccine, the "flu shot", is given by injection with a needle.  Live, attenuated (weakened) influenza vaccine is sprayed into the nostrils. This vaccine is described in a separate Vaccine Information Statement. A "high-dose" inactivated influenza vaccine is available for people 65 years of age and older. Ask your doctor for more information.  Influenza viruses are always changing, so annual vaccination is recommended. Each year scientists try to match the viruses in the vaccine to those most likely to cause flu that year. Flu vaccine will not prevent disease from other viruses, including flu viruses not contained in the vaccine. It takes up to 2 weeks for protection to develop after the shot. Protection lasts about 1 year. Some inactivated influenza vaccine  contains a preservative called thimerosal. Thimerosal-free influenza vaccine is available. Ask your doctor for more information. WHO SHOULD GET INACTIVATED INFLUENZA VACCINE AND WHEN? WHO  All people 6 months of age and older should get flu vaccine.  Vaccination is especially important for people at higher risk of severe influenza and their close contacts, including healthcare personnel and close contacts of children younger than 6 months. WHEN Getting the vaccine as soon as it is available. This should provide protection if the flu season comes early. You can get the vaccine as long as illness is occurring in your community. Influenza can occur at any time, but most influenza occurs from October through May. In recent seasons, most infections have occurred in January and February. Getting vaccinated in December, or even later, will still be beneficial in most years. Adults and older children need 1 dose of influenza vaccine each year. But some children younger than 9 years of age need 2 doses to be protected. Ask your doctor. Influenza vaccine may be given at the same time as other vaccines, including pneumococcal vaccine. SOME PEOPLE SHOULD NOT GET INACTIVATED INFLUENZA VACCINE OR SHOULD WAIT  Tell your doctor if you have any severe (life-threatening) allergies, including a severe allergy to eggs. A severe allergy to any vaccine component may be a reason not to get the vaccine. Allergic reactions to influenza vaccine are rare.  Tell your doctor if you ever had a severe reaction after a dose of influenza vaccine.  Tell your doctor if you ever had Guillain-Barr syndrome (a severe paralytic illness, also called GBS). Your doctor will help you decide whether the vaccine is recommended for you.  People who are   moderately or severely ill should usually wait until they recover before getting the flu vaccine. If you are ill, talk to your doctor about whether to reschedule the vaccination. People with  a mild illness can usually get the vaccine. WHAT ARE THE RISKS FROM INACTIVATED INFLUENZA VACCINE? A vaccine, like any medicine, could possibly cause serious problems, such as severe allergic reactions. The risk of a vaccine causing serious harm, or death, is extremely small. Serious problems from inactivated influenza vaccine are very rare. The viruses in inactivated influenza vaccine have been killed, so you cannot get influenza from the vaccine. Mild problems:  Soreness, redness, or swelling where the shot was given.  Hoarseness; sore, red or itchy eyes; cough.  Fever.  Aches.  Headache.  Itching.  Fatigue. If these problems occur, they usually begin soon after the shot and last 1 to 2 days. Moderate problems: Young children who get inactivated flu vaccine and pneumococcal vaccine (PCV13) at the same time appear to be at increased risk for seizures caused by fever. Ask your doctor for more information. Tell your doctor if a child who is getting flu vaccine has ever had a seizure. Severe problems:  Life-threatening allergic reactions from vaccines are very rare. If they do occur, it is usually within a few minutes to a few hours after the shot.  In 1976, a type of inactivated influenza (swine flu) vaccine was associated with Guillan-Barr syndrome (GBS). Since then, flu vaccines have not been clearly linked to GBS. However, if there is a risk of GBS from current flu vaccines, it would be no more than 1 or 2 cases per million people vaccinated. This is much lower than the risk of severe influenza, which can be prevented by vaccination. The safety of vaccines is always being monitored. For more information, visit:  www.cdc.gov/vaccinesafety/Vaccine_Monitoring/Index.html and  www.cdc.gov/vaccinesafety/Activities/Activities_Index.html One brand of inactivated flu vaccine, called Afluria, should not be given to children 8 years of age or younger, except in special circumstances. A  related vaccine was associated with fevers and fever-related seizures in young children in [place]. Your doctor can give you more information. WHAT IF THERE IS A SEVERE REACTION? What should I look for? Any unusual condition, such as a high fever or unusual behavior. Signs of a serious allergic reaction can include difficulty breathing, hoarseness or wheezing, hives, paleness, weakness, a fast heartbeat, or dizziness. What should I do?  Call a doctor, or get the person to a doctor right away.  Tell your doctor what happened, the date and time it happened, and when the vaccination was given.  Ask your doctor, nurse, or health department to report the reaction by filing a Vaccine Adverse Event Reporting System (VAERS) form. Or, you can file this report through the VAERS website at www.vaers.hhs.gov or by calling 1-800-822-7967. VAERS does not provide medical advice. THE NATIONAL VACCINE INJURY COMPENSATION PROGRAM The National Vaccine Injury Compensation Program (VICP) was created in 1986. People who believe they may have been injured by a vaccine can learn about the program and about filing a claim by calling 1-800-338-2382, or visiting the VICP website at www.hrsa.gov/vaccinecompensation HOW CAN I LEARN MORE?  Ask your doctor. They can give you the vaccine package insert or suggest other sources of information.  Call your local or state health department.  Contact the Centers for Disease Control and Prevention (CDC):  Call 1-800-232-4636 (1-800-CDC-INFO) or  Visit the CDC's website at www.cdc.gov/flu CDC Inactivated Influenza Vaccine VIS (01/10/11) Document Released: 04/21/2006 Document Revised: 12/27/2011 Document Reviewed:   01/10/2011 [employer] Patient Information 2013 [employer].  

## 2012-08-02 NOTE — Telephone Encounter (Signed)
Patient advised and appointment scheduled. 

## 2012-08-02 NOTE — Telephone Encounter (Signed)
Cant prescribe medication without examining patient and know what I am treating

## 2012-08-20 ENCOUNTER — Encounter: Payer: Self-pay | Admitting: Gynecology

## 2012-08-20 ENCOUNTER — Ambulatory Visit (INDEPENDENT_AMBULATORY_CARE_PROVIDER_SITE_OTHER): Payer: Managed Care, Other (non HMO) | Admitting: Gynecology

## 2012-08-20 VITALS — BP 132/88 | Ht 65.75 in | Wt 196.0 lb

## 2012-08-20 DIAGNOSIS — R635 Abnormal weight gain: Secondary | ICD-10-CM

## 2012-08-20 DIAGNOSIS — Z23 Encounter for immunization: Secondary | ICD-10-CM

## 2012-08-20 DIAGNOSIS — Z01419 Encounter for gynecological examination (general) (routine) without abnormal findings: Secondary | ICD-10-CM

## 2012-08-20 DIAGNOSIS — E039 Hypothyroidism, unspecified: Secondary | ICD-10-CM

## 2012-08-20 LAB — CBC WITH DIFFERENTIAL/PLATELET
Basophils Absolute: 0.1 10*3/uL (ref 0.0–0.1)
Basophils Relative: 2 % — ABNORMAL HIGH (ref 0–1)
Eosinophils Absolute: 0.2 10*3/uL (ref 0.0–0.7)
Hemoglobin: 14.1 g/dL (ref 12.0–15.0)
MCH: 28 pg (ref 26.0–34.0)
MCHC: 33.9 g/dL (ref 30.0–36.0)
Monocytes Absolute: 0.6 10*3/uL (ref 0.1–1.0)
Monocytes Relative: 9 % (ref 3–12)
Neutrophils Relative %: 57 % (ref 43–77)
RDW: 14.3 % (ref 11.5–15.5)

## 2012-08-20 LAB — COMPREHENSIVE METABOLIC PANEL
ALT: 12 U/L (ref 0–35)
AST: 15 U/L (ref 0–37)
Albumin: 4.2 g/dL (ref 3.5–5.2)
Alkaline Phosphatase: 55 U/L (ref 39–117)
BUN: 14 mg/dL (ref 6–23)
Calcium: 9.3 mg/dL (ref 8.4–10.5)
Chloride: 106 mEq/L (ref 96–112)
Potassium: 3.9 mEq/L (ref 3.5–5.3)
Sodium: 139 mEq/L (ref 135–145)

## 2012-08-20 LAB — LIPID PANEL
Total CHOL/HDL Ratio: 2.6 Ratio
VLDL: 11 mg/dL (ref 0–40)

## 2012-08-20 LAB — TSH: TSH: 3.053 u[IU]/mL (ref 0.350–4.500)

## 2012-08-20 NOTE — Patient Instructions (Signed)

## 2012-08-20 NOTE — Addendum Note (Signed)
Addended by: Bertram Savin A on: 08/20/2012 09:17 AM   Modules accepted: Orders

## 2012-08-20 NOTE — Progress Notes (Signed)
Erica Hull 10/19/1965 [id number]   History:    47 y.o.  for annual gyn exam with no complaints today. Patient with history of hypothyroidism on Synthroid 75 mcg daily. She has continued to gain weight and is currently weighing 196 pounds blood BMI 31.88 kg/meter square. Her menstrual cycles are regular. Patient with prior history of tubal sterilization procedure as well as endometrial ablation several years ago. Patient's past obstetrical history as follows:  First pregnancy elective AB Second pregnancy elective AB Third pregnancy C-section For pregnancy missed AB fifth pregnancy C-section with tubal sterilization procedure  Patient does her monthly self breast examination this past month she had a normal mammogram. Her flu vaccine is up-to-date but not her Tdap. Patient with no past history of abnormal Pap smears.  Past medical history,surgical history, family history and social history were all reviewed and documented in the EPIC chart.  Gynecologic History Patient's last menstrual period was 08/04/2012. Contraception: tubal ligation Last Pap: 2012. Results were: normal Last mammogram: 2014. Results were: normal  Obstetric History OB History   Grav Para Term Preterm Abortions TAB SAB Ect Mult Living   4 2 2  2     2      # Outc Date GA Lbr Len/2nd Wgt Sex Del Anes PTL Lv   1 TRM     M CS  No Yes   2 TRM     F CS  No Yes   3 ABT            4 ABT                ROS: A ROS was performed and pertinent positives and negatives are included in the history.  GENERAL: No fevers or chills. HEENT: No change in vision, no earache, sore throat or sinus congestion. NECK: No pain or stiffness. CARDIOVASCULAR: No chest pain or pressure. No palpitations. PULMONARY: No shortness of breath, cough or wheeze. GASTROINTESTINAL: No abdominal pain, nausea, vomiting or diarrhea, melena or bright red blood per rectum. GENITOURINARY: No urinary frequency, urgency, hesitancy or dysuria. MUSCULOSKELETAL:  No joint or muscle pain, no back pain, no recent trauma. DERMATOLOGIC: No rash, no itching, no lesions. ENDOCRINE: No polyuria, polydipsia, no heat or cold intolerance. No recent change in weight. HEMATOLOGICAL: No anemia or easy bruising or bleeding. NEUROLOGIC: No headache, seizures, numbness, tingling or weakness. PSYCHIATRIC: No depression, no loss of interest in normal activity or change in sleep pattern.     Exam: chaperone present  BP 132/88  Ht 5' 5.75" (1.67 m)  Wt 196 lb (88.905 kg)  BMI 31.88 kg/m2  LMP 08/04/2012  Body mass index is 31.88 kg/(m^2).  General appearance : Well developed well nourished female. No acute distress HEENT: Neck supple, trachea midline, no carotid bruits, no thyroidmegaly Lungs: Clear to auscultation, no rhonchi or wheezes, or rib retractions  Heart: Regular rate and rhythm, no murmurs or gallops Breast:Examined in sitting and supine position were symmetrical in appearance, no palpable masses or tenderness,  no skin retraction, no nipple inversion, no nipple discharge, no skin discoloration, no axillary or supraclavicular lymphadenopathy Abdomen: no palpable masses or tenderness, no rebound or guarding Extremities: no edema or skin discoloration or tenderness  Pelvic:  Bartholin, Urethra, Skene Glands: Within normal limits             Vagina: No gross lesions or discharge  Cervix: No gross lesions or discharge  Uterus  Anteverted upper limits of normal, normal size, shape and consistency, non-tender  and mobile  Adnexa  Without masses or tenderness  Anus and perineum  normal   Rectovaginal  normal sphincter tone without palpated masses or tenderness             Hemoccult not done     Assessment/Plan:  47 y.o. female for annual exam who was informed of the new Pap smear screening guidelines. No Pap smear done today. Patient with past history of normal Pap smears. Patient will have the Tdap vaccine today. The following labs were ordered today: CBC,  lipid profile, comprehensive metabolic panel, TSH and urinalysis. She was reminded to do her monthly self breast examination. We discussed importance of calcium vitamin D and regular exercise for osteoporosis prevention.    Ok Edwards MD, 9:05 AM 08/20/2012

## 2012-08-21 LAB — URINALYSIS W MICROSCOPIC + REFLEX CULTURE
Leukocytes, UA: NEGATIVE
Protein, ur: NEGATIVE mg/dL
Specific Gravity, Urine: 1.025 (ref 1.005–1.030)
Urobilinogen, UA: 0.2 mg/dL (ref 0.0–1.0)

## 2012-08-22 ENCOUNTER — Telehealth: Payer: Self-pay | Admitting: Gynecology

## 2012-08-22 NOTE — Telephone Encounter (Signed)
Patient was contacted today that her recent urine culture demonstrated evidence of a UTI (E. Coli > 100,000 colony count). She was prescribed Macrobid one PO BID for  7 days # 14

## 2012-08-23 LAB — URINE CULTURE

## 2012-08-24 ENCOUNTER — Telehealth: Payer: Self-pay | Admitting: Gynecology

## 2012-08-24 NOTE — Telephone Encounter (Signed)
For clarification purposes patient was notified recently that she has urinary tract infection and was placed on Macrobid. The microbiology report demonstrated Klebsiella organism and not  E. Coli. Regardless the organism is sensitive to the Macrobid.

## 2012-10-08 ENCOUNTER — Encounter: Payer: Self-pay | Admitting: *Deleted

## 2012-10-16 ENCOUNTER — Ambulatory Visit (INDEPENDENT_AMBULATORY_CARE_PROVIDER_SITE_OTHER): Payer: Managed Care, Other (non HMO) | Admitting: General Surgery

## 2012-10-16 VITALS — BP 120/64 | HR 66 | Resp 12 | Ht 67.0 in | Wt 192.0 lb

## 2012-10-16 DIAGNOSIS — I83893 Varicose veins of bilateral lower extremities with other complications: Secondary | ICD-10-CM

## 2012-10-16 NOTE — Progress Notes (Signed)
Patient ID: Erica Hull, female   DOB: February 20, 1966, 47 y.o.   MRN: [id number]  Chief Complaint  Patient presents with  . varicous veins    HPI Erica Hull is a 47 y.o. female.  Patient here today to discuss varicose veins and possible sclerotherapy mostly the left leg. Patient with known history of left leg radiofrequency venous closure procedure done 2008, does still have a little swelling in feet at end of day but otherwise no complaints.  The last left leg sclerotherapy was done 11-27-09. She is now feeling sensation of bruise over spider veins in leg lower half HPI  Past Medical History  Diagnosis Date  . Hypothyroidism   . Missed ab   . Elective abortion     X2  . Hypertension     pregnancy induced  . Hx of cystitis 2006  . H/O peripheral vascular disease 2006    Past Surgical History  Procedure Laterality Date  . Cesarean section    . Cesarean section w/btl    . Dilation and curettage of uterus    . Endometrial ablation      HTA  . Vein closure procedure Left 2006    Family History  Problem Relation Age of Onset  . Hypertension Father     Social History History  Substance Use Topics  . Smoking status: Never Smoker   . Smokeless tobacco: Never Used  . Alcohol Use: Yes     Comment: rare    No Known Allergies  Current Outpatient Prescriptions  Medication Sig Dispense Refill  . Biotin 5000 MCG TABS Take 2 tablets by mouth daily.      . ferrous sulfate 325 (65 FE) MG EC tablet Take 325 mg by mouth daily.      Marland Kitchen levothyroxine (SYNTHROID) 75 MCG tablet Take 1 tablet (75 mcg total) by mouth daily.  90 tablet  4   Current Facility-Administered Medications  Medication Dose Route Frequency Provider Last Rate Last Dose  . lidocaine (XYLOCAINE) 1 % (with pres) injection 10 mL  10 mL Infiltration Once Ok Edwards, MD        Review of Systems Review of Systems  Constitutional: Negative.   Respiratory: Negative.   Cardiovascular: Negative.     Blood  pressure 120/64, pulse 66, resp. rate 12, height 5\' 7"  (1.702 m), weight 192 lb (87.091 kg), last menstrual period 09/27/2012.  Physical Exam Physical Exam  Constitutional: She is oriented to person, place, and time. She appears well-developed and well-nourished.  Neurological: She is alert and oriented to person, place, and time.  Skin: Skin is warm and dry.  Multiple clusters of spider veins noted anterior left leg, lower half.  Data Reviewed    Assessment    Symptomatic spider veins left leg     Plan    Sclerotherapy performed with pt consent. 0.5 % Sotradecol was used and multiple clusters of spider angiomata were successfully injected.  No immediate problems from  Procedure. Compression hose applied.       Jaxten Brosh G 10/17/2012, 4:15 PM

## 2012-10-16 NOTE — Patient Instructions (Addendum)
Wear your compression stocking all day for several weeks, avoid  sitting for long periods of time Call for concerns

## 2012-10-17 ENCOUNTER — Encounter: Payer: Self-pay | Admitting: General Surgery

## 2012-10-17 DIAGNOSIS — I83893 Varicose veins of bilateral lower extremities with other complications: Secondary | ICD-10-CM | POA: Insufficient documentation

## 2012-11-01 ENCOUNTER — Other Ambulatory Visit: Payer: Self-pay | Admitting: Gynecology

## 2012-11-02 ENCOUNTER — Encounter: Payer: Managed Care, Other (non HMO) | Admitting: Vascular Surgery

## 2013-05-16 ENCOUNTER — Other Ambulatory Visit: Payer: Self-pay

## 2014-01-13 ENCOUNTER — Telehealth: Payer: Self-pay | Admitting: *Deleted

## 2014-01-13 MED ORDER — LEVOTHYROXINE SODIUM 75 MCG PO TABS: 75.0000 ug | ORAL_TABLET | Freq: Every day | ORAL | Status: DC

## 2014-01-13 NOTE — Telephone Encounter (Signed)
rx sent

## 2014-01-13 NOTE — Telephone Encounter (Signed)
Pt has annual scheduled on 01/29/14 requesting refill on synthroid 75 mcg. Last annual in feb 2014 Okay to refill?

## 2014-01-13 NOTE — Telephone Encounter (Signed)
Please call her in 1 month supply only. She needs to schedule her annual. She also needs her thyroid function test to see if she needs an adjustment of her dosage.

## 2014-01-29 ENCOUNTER — Other Ambulatory Visit: Payer: Self-pay | Admitting: Gynecology

## 2014-01-29 ENCOUNTER — Ambulatory Visit (INDEPENDENT_AMBULATORY_CARE_PROVIDER_SITE_OTHER): Payer: Managed Care, Other (non HMO) | Admitting: Gynecology

## 2014-01-29 ENCOUNTER — Encounter: Payer: Self-pay | Admitting: Gynecology

## 2014-01-29 ENCOUNTER — Other Ambulatory Visit (HOSPITAL_COMMUNITY)
Admission: RE | Admit: 2014-01-29 | Discharge: 2014-01-29 | Disposition: A | Discharge status: Home / Self Care | Payer: Managed Care, Other (non HMO) | Source: Ambulatory Visit | Attending: Gynecology | Admitting: Gynecology

## 2014-01-29 VITALS — BP 132/78 | Ht 66.0 in | Wt 200.0 lb

## 2014-01-29 DIAGNOSIS — Z01419 Encounter for gynecological examination (general) (routine) without abnormal findings: Secondary | ICD-10-CM | POA: Insufficient documentation

## 2014-01-29 DIAGNOSIS — Z1151 Encounter for screening for human papillomavirus (HPV): Secondary | ICD-10-CM | POA: Insufficient documentation

## 2014-01-29 DIAGNOSIS — R7989 Other specified abnormal findings of blood chemistry: Secondary | ICD-10-CM

## 2014-01-29 DIAGNOSIS — D251 Intramural leiomyoma of uterus: Secondary | ICD-10-CM

## 2014-01-29 DIAGNOSIS — N938 Other specified abnormal uterine and vaginal bleeding: Secondary | ICD-10-CM

## 2014-01-29 DIAGNOSIS — E039 Hypothyroidism, unspecified: Secondary | ICD-10-CM

## 2014-01-29 DIAGNOSIS — N949 Unspecified condition associated with female genital organs and menstrual cycle: Secondary | ICD-10-CM

## 2014-01-29 DIAGNOSIS — R635 Abnormal weight gain: Secondary | ICD-10-CM

## 2014-01-29 HISTORY — DX: Other specified abnormal uterine and vaginal bleeding: N93.8

## 2014-01-29 LAB — COMPREHENSIVE METABOLIC PANEL
ALK PHOS: 61 U/L (ref 39–117)
ALT: 12 U/L (ref 0–35)
AST: 17 U/L (ref 0–37)
Albumin: 4 g/dL (ref 3.5–5.2)
BILIRUBIN TOTAL: 0.5 mg/dL (ref 0.2–1.2)
BUN: 11 mg/dL (ref 6–23)
CALCIUM: 9.1 mg/dL (ref 8.4–10.5)
CHLORIDE: 106 meq/L (ref 96–112)
CO2: 25 mEq/L (ref 19–32)
CREATININE: 0.64 mg/dL (ref 0.50–1.10)
Glucose, Bld: 80 mg/dL (ref 70–99)
Potassium: 4 mEq/L (ref 3.5–5.3)
Sodium: 140 mEq/L (ref 135–145)
Total Protein: 6.2 g/dL (ref 6.0–8.3)

## 2014-01-29 LAB — CBC WITH DIFFERENTIAL/PLATELET
BASOS ABS: 0.1 10*3/uL (ref 0.0–0.1)
BASOS PCT: 1 % (ref 0–1)
EOS PCT: 7 % — AB (ref 0–5)
Eosinophils Absolute: 0.4 10*3/uL (ref 0.0–0.7)
HCT: 41.5 % (ref 36.0–46.0)
Hemoglobin: 14.1 g/dL (ref 12.0–15.0)
LYMPHS PCT: 30 % (ref 12–46)
Lymphs Abs: 1.6 10*3/uL (ref 0.7–4.0)
MCH: 28.3 pg (ref 26.0–34.0)
MCHC: 34 g/dL (ref 30.0–36.0)
MCV: 83.2 fL (ref 78.0–100.0)
Monocytes Absolute: 0.4 10*3/uL (ref 0.1–1.0)
Monocytes Relative: 7 % (ref 3–12)
Neutro Abs: 2.9 10*3/uL (ref 1.7–7.7)
Neutrophils Relative %: 55 % (ref 43–77)
PLATELETS: 195 10*3/uL (ref 150–400)
RBC: 4.99 MIL/uL (ref 3.87–5.11)
RDW: 13.4 % (ref 11.5–15.5)
WBC: 5.2 10*3/uL (ref 4.0–10.5)

## 2014-01-29 LAB — LIPID PANEL
CHOL/HDL RATIO: 2.5 ratio
Cholesterol: 155 mg/dL (ref 0–200)
HDL: 63 mg/dL (ref >39)
LDL CALC: 82 mg/dL (ref 0–99)
Triglycerides: 48 mg/dL (ref <150)
VLDL: 10 mg/dL (ref 0–40)

## 2014-01-29 LAB — TSH: TSH: 4.502 u[IU]/mL — AB (ref 0.350–4.500)

## 2014-01-29 MED ORDER — LEVOTHYROXINE SODIUM 88 MCG PO TABS: 88.0000 ug | ORAL_TABLET | Freq: Every day | ORAL | Status: DC

## 2014-01-29 MED ORDER — LEVOTHYROXINE SODIUM 75 MCG PO TABS: 75.0000 ug | ORAL_TABLET | Freq: Every day | ORAL | Status: DC

## 2014-01-29 NOTE — Patient Instructions (Addendum)
Patient information: High cholesterol (The Basics)  What is cholesterol? - Cholesterol is a substance that is found in the blood. Everyone has some. It is needed for good health. The problem is, people sometimes have too much cholesterol. Compared with people with normal cholesterol, people with high cholesterol have a higher risk of heart attacks, strokes, and other health problems. The higher your cholesterol, the higher your risk of these problems. Cholesterol levels in your body are determined significantly by your diet. Cholesterol levels may also be related to heart disease. The following material helps to explain this relationship and discusses what you can do to help keep your heart healthy. Not all cholesterol is bad. Low-density lipoprotein (LDL) cholesterol is the "bad" cholesterol. It may cause fatty deposits to build up inside your arteries. High-density lipoprotein (HDL) cholesterol is "good." It helps to remove the "bad" LDL cholesterol from your blood. Cholesterol is a very important risk factor for heart disease. Other risk factors are high blood pressure, smoking, stress, heredity, and weight.  The heart muscle gets its supply of blood through the coronary arteries. If your LDL cholesterol is high and your HDL cholesterol is low, you are at risk for having fatty deposits build up in your coronary arteries. This leaves less room through which blood can flow. Without sufficient blood and oxygen, the heart muscle cannot function properly and you may feel chest pains (angina pectoris). When a coronary artery closes up entirely, a part of the heart muscle may die, causing a heart attack (myocardial infarction).  CHECKING CHOLESTEROL When your caregiver sends your blood to a lab to be analyzed for cholesterol, a complete lipid (fat) profile may be done. With this test, the total amount of cholesterol and levels of LDL and HDL are determined. Triglycerides  are a type of fat that circulates in the blood and can also be used to determine heart disease risk. Are there different types of cholesterol? - Yes, there are a few different types. If you get a cholesterol test, you may hear your doctor or nurse talk about: Total cholesterol  LDL cholesterol - Some people call this the "bad" cholesterol. That's because having high LDL levels raises your risk of heart attacks, strokes, and other health problems.  HDL cholesterol - Some people call this the "good" cholesterol. That's because having high HDL levels lowers your risk of heart attacks, strokes, and other health problems.  Non-HDL cholesterol - Non-HDL cholesterol is your total cholesterol minus your HDL cholesterol.  Triglycerides - Triglycerides are not cholesterol. They are a type of fat. But they often get measured when cholesterol is measured. (Having high triglycerides also seems to increase the risk of heart attacks and strokes.)   Keep in mind, though, that many people who cannot meet these goals still have a low risk of heart attacks and strokes. What should I do if my doctor tells me I have high cholesterol? - Ask your doctor what your overall risk of heart attacks and strokes is. High cholesterol, by itself, is not always a reason to worry. Having high cholesterol is just one of many things that can increase your risk of heart attacks and strokes. Other factors that increase your risk include:  Cigarette smoking  High blood pressure  Having a parent, sister, or brother who got heart disease at a young age (Young, in this case, means younger than 55 for men and younger than 65 for women.)  Being a man (Women are at risk, too, but men   have a higher risk.)  Older age  If you are at high risk of heart attacks and strokes, having high cholesterol is a problem. On the other hand, if you have are at low risk, having high cholesterol may not mean much. Should I take medicine to lower cholesterol? - Not  everyone who has high cholesterol needs medicines. Your doctor or nurse will decide if you need them based on your age, family history, and other health concerns.  You should probably take a cholesterol-lowering medicine called a statin if you: Already had a heart attack or stroke  Have known heart disease  Have diabetes  Have a condition called peripheral artery disease, which makes it painful to walk, and happens when the arteries in your legs get clogged with fatty deposits  Have an abdominal aortic aneurysm, which is a widening of the main artery in the belly  Most people with any of the conditions listed above should take a statin no matter what their cholesterol level is. If your doctor or nurse puts you on a statin, stay on it. The medicine may not make you feel any different. But it can help prevent heart attacks, strokes, and death.  Can I lower my cholesterol without medicines? - Yes, you can lower your cholesterol some by:  Avoiding red meat, butter, fried foods, cheese, and other foods that have a lot of saturated fat  Losing weight (if you are overweight)  Being more active Even if these steps do little to change your cholesterol, they can improve your health in many ways.                                                   Cholesterol Control Diet  CONTROLLING CHOLESTEROL WITH DIET Although exercise and lifestyle factors are important, your diet is key. That is because certain foods are known to raise cholesterol and others to lower it. The goal is to balance foods for their effect on cholesterol and more importantly, to replace saturated and trans fat with other types of fat, such as monounsaturated fat, polyunsaturated fat, and omega-3 fatty acids. On average, a person should consume no more than 15 to 17 g of saturated fat daily. Saturated and trans fats are considered "bad" fats, and they will raise LDL cholesterol. Saturated fats are primarily found in animal products such as  meats, butter, and cream. However, that does not mean you need to sacrifice all your favorite foods. Today, there are good tasting, low-fat, low-cholesterol substitutes for most of the things you like to eat. Choose low-fat or nonfat alternatives. Choose round or loin cuts of red meat, since these types of cuts are lowest in fat and cholesterol. Chicken (without the skin), fish, veal, and ground turkey breast are excellent choices. Eliminate fatty meats, such as hot dogs and salami. Even shellfish have little or no saturated fat. Have a 3 oz (85 g) portion when you eat lean meat, poultry, or fish. Trans fats are also called "partially hydrogenated oils." They are oils that have been scientifically manipulated so that they are solid at room temperature resulting in a longer shelf life and improved taste and texture of foods in which they are added. Trans fats are found in stick margarine, some tub margarines, cookies, crackers, and baked goods.  When baking and cooking, oils are an excellent substitute   for butter. The monounsaturated oils are especially beneficial since it is believed they lower LDL and raise HDL. The oils you should avoid entirely are saturated tropical oils, such as coconut and palm.  Remember to eat liberally from food groups that are naturally free of saturated and trans fat, including fish, fruit, vegetables, beans, grains (barley, rice, couscous, bulgur wheat), and pasta (without cream sauces).  IDENTIFYING FOODS THAT LOWER CHOLESTEROL  Soluble fiber may lower your cholesterol. This type of fiber is found in fruits such as apples, vegetables such as broccoli, potatoes, and carrots, legumes such as beans, peas, and lentils, and grains such as barley. Foods fortified with plant sterols (phytosterol) may also lower cholesterol. You should eat at least 2 g per day of these foods for a cholesterol lowering effect.  Read package labels to identify low-saturated fats, trans fats free, and  low-fat foods at the supermarket. Select cheeses that have only 2 to 3 g saturated fat per ounce. Use a heart-healthy tub margarine that is free of trans fats or partially hydrogenated oil. When buying baked goods (cookies, crackers), avoid partially hydrogenated oils. Breads and muffins should be made from whole grains (whole-wheat or whole oat flour, instead of "flour" or "enriched flour"). Buy non-creamy canned soups with reduced salt and no added fats.  FOOD PREPARATION TECHNIQUES  Never deep-fry. If you must fry, either stir-fry, which uses very little fat, or use non-stick cooking sprays. When possible, broil, bake, or roast meats, and steam vegetables. Instead of dressing vegetables with butter or margarine, use lemon and herbs, applesauce and cinnamon (for squash and sweet potatoes), nonfat yogurt, salsa, and low-fat dressings for salads.  LOW-SATURATED FAT / LOW-FAT FOOD SUBSTITUTES Meats / Saturated Fat (g)  Avoid: Steak, marbled (3 oz/85 g) / 11 g   Choose: Steak, lean (3 oz/85 g) / 4 g   Avoid: Hamburger (3 oz/85 g) / 7 g   Choose: Hamburger, lean (3 oz/85 g) / 5 g   Avoid: Ham (3 oz/85 g) / 6 g   Choose: Ham, lean cut (3 oz/85 g) / 2.4 g   Avoid: Chicken, with skin, dark meat (3 oz/85 g) / 4 g   Choose: Chicken, skin removed, dark meat (3 oz/85 g) / 2 g   Avoid: Chicken, with skin, light meat (3 oz/85 g) / 2.5 g   Choose: Chicken, skin removed, light meat (3 oz/85 g) / 1 g  Dairy / Saturated Fat (g)  Avoid: Whole milk (1 cup) / 5 g   Choose: Low-fat milk, 2% (1 cup) / 3 g   Choose: Low-fat milk, 1% (1 cup) / 1.5 g   Choose: Skim milk (1 cup) / 0.3 g   Avoid: Hard cheese (1 oz/28 g) / 6 g   Choose: Skim milk cheese (1 oz/28 g) / 2 to 3 g   Avoid: Cottage cheese, 4% fat (1 cup) / 6.5 g   Choose: Low-fat cottage cheese, 1% fat (1 cup) / 1.5 g   Avoid: Ice cream (1 cup) / 9 g   Choose: Sherbet (1 cup) / 2.5 g   Choose: Nonfat frozen yogurt (1 cup) / 0.3 g    Choose: Frozen fruit bar / trace   Avoid: Whipped cream (1 tbs) / 3.5 g   Choose: Nondairy whipped topping (1 tbs) / 1 g  Condiments / Saturated Fat (g)  Avoid: Mayonnaise (1 tbs) / 2 g   Choose: Low-fat mayonnaise (1 tbs) / 1 g   Avoid:   Butter (1 tbs) / 7 g   Choose: Extra light margarine (1 tbs) / 1 g   Avoid: Coconut oil (1 tbs) / 11.8 g   Choose: Olive oil (1 tbs) / 1.8 g   Choose: Corn oil (1 tbs) / 1.7 g   Choose: Safflower oil (1 tbs) / 1.2 g   Choose: Sunflower oil (1 tbs) / 1.4 g   Choose: Soybean oil (1 tbs) / 2.4 g   Choose: Canola oil (1 tbs) / 1 g  Endometrial Biopsy Endometrial biopsy is a procedure in which a tissue sample is taken from inside the uterus. The tissue sample is then looked at under a microscope to see if the tissue is normal or abnormal. The endometrium is the lining of the uterus. This procedure helps determine where you are in your menstrual cycle and how hormone levels are affecting the lining of the uterus. This procedure may also be used to evaluate uterine bleeding or to diagnose endometrial cancer, tuberculosis, polyps, or inflammatory conditions.  LET [facility] CARE PROVIDER KNOW ABOUT: Any allergies you have. All medicines you are taking, including vitamins, herbs, eye drops, creams, and over-the-counter medicines. Previous problems you or members of your family have had with the use of anesthetics. Any blood disorders you have. Previous surgeries you have had. Medical conditions you have. Possibility of pregnancy. RISKS AND COMPLICATIONS Generally, this is a safe procedure. However, as with any procedure, complications can occur. Possible complications include: Bleeding. Pelvic infection. Puncture of the uterine wall with the biopsy device (rare). BEFORE THE PROCEDURE  Keep a record of your menstrual cycles as directed by your health care provider. You may need to schedule your procedure for a specific time in your cycle. You  may want to bring a sanitary pad to wear home after the procedure. Arrange for someone to drive you home after the procedure if you will be given a medicine to help you relax (sedative). PROCEDURE  You may be given a sedative to relax you. You will lie on an exam table with your feet and legs supported as in a pelvic exam. Your health care provider will insert an instrument (speculum) into your vagina to see your cervix. Your cervix will be cleansed with an antiseptic solution. A medicine (local anesthetic) will be used to numb the cervix. A forceps instrument (tenaculum) will be used to hold your cervix steady for the biopsy. A thin, rodlike instrument (uterine sound) will be inserted through your cervix to determine the length of your uterus and the location where the biopsy sample will be removed. A thin, flexible tube (catheter) will be inserted through your cervix and into the uterus. The catheter is used to collect the biopsy sample from your endometrial tissue. The catheter and speculum will then be removed, and the tissue sample will be sent to a lab for examination. AFTER THE PROCEDURE You will rest in a recovery area until you are ready to go home. You may have mild cramping and a small amount of vaginal bleeding for a few days after the procedure. This is normal. Make sure you find out how to get your test results. Document Released: 10/28/2004 Document Revised: 02/27/2013 Document Reviewed: 12/12/2012 [facility] Patient Information 2015 [place], [place]. This information is not intended to replace advice given to you by your health care provider. Make sure you discuss any questions you have with your health care provider.

## 2014-01-29 NOTE — Progress Notes (Signed)
Rossi Silvestro Ealy 1965/11/05 [id number]   History:    48 y.o.  for annual gyn exam who states that her cycles are once a month but she'll spot in between for the past several months. This happened back in 2012 and she had a normal sonohysterogram with the exception of small intramural myoma. Patient also has been complaining of weight gain. She does have history of hypothyroidism. Patient's past obstetrical history as follows:  First pregnancy elective AB  Second pregnancy elective AB  Third pregnancy C-section  Fourth pregnancy missed AB  fifth pregnancy C-section with tubal sterilization procedure  The patient also has had endometrial ablation in the past. Patient with no prior history of abnormal Pap smears  Past medical history,surgical history, family history and social history were all reviewed and documented in the EPIC chart.  Gynecologic History Patient's last menstrual period was 01/20/2014. Contraception: tubal ligation Last Pap: 2012. Results were: normal Last mammogram: 2014. Results were: normal  Obstetric History OB History  Gravida Para Term Preterm AB SAB TAB Ectopic Multiple Living  4 2 2  2     2     # Outcome Date GA Lbr Len/2nd Weight Sex Delivery Anes PTL Lv  4 ABT           3 ABT           2 TRM     F CS  N Y  1 TRM     M CS  N Y       ROS: A ROS was performed and pertinent positives and negatives are included in the history.  GENERAL: No fevers or chills. HEENT: No change in vision, no earache, sore throat or sinus congestion. NECK: No pain or stiffness. CARDIOVASCULAR: No chest pain or pressure. No palpitations. PULMONARY: No shortness of breath, cough or wheeze. GASTROINTESTINAL: No abdominal pain, nausea, vomiting or diarrhea, melena or bright red blood per rectum. GENITOURINARY: No urinary frequency, urgency, hesitancy or dysuria. MUSCULOSKELETAL: No joint or muscle pain, no back pain, no recent trauma. DERMATOLOGIC: No rash, no itching, no lesions.  ENDOCRINE: No polyuria, polydipsia, no heat or cold intolerance. No recent change in weight. HEMATOLOGICAL: No anemia or easy bruising or bleeding. NEUROLOGIC: No headache, seizures, numbness, tingling or weakness. PSYCHIATRIC: No depression, no loss of interest in normal activity or change in sleep pattern.     Exam: chaperone present  BP 132/78  Ht 5\' 6"  (1.676 m)  Wt 200 lb (90.719 kg)  BMI 32.30 kg/m2  LMP 01/20/2014  Body mass index is 32.3 kg/(m^2).  General appearance : Well developed well nourished female. No acute distress HEENT: Neck supple, trachea midline, no carotid bruits, no thyroidmegaly Lungs: Clear to auscultation, no rhonchi or wheezes, or rib retractions  Heart: Regular rate and rhythm, no murmurs or gallops Breast:Examined in sitting and supine position were symmetrical in appearance, no palpable masses or tenderness,  no skin retraction, no nipple inversion, no nipple discharge, no skin discoloration, no axillary or supraclavicular lymphadenopathy Abdomen: no palpable masses or tenderness, no rebound or guarding Extremities: no edema or skin discoloration or tenderness  Pelvic:  Bartholin, Urethra, Skene Glands: Within normal limits             Vagina: No gross lesions or discharge  Cervix: No gross lesions or discharge  Uterus  anteverted irregular 12 week size nontender,  Adnexa  Without masses or tenderness  Anus and perineum  normal   Rectovaginal  normal sphincter tone without  palpated masses or tenderness             Hemoccult not indicated   Patient was counseled for endometrial biopsy after Pap smear was obtained. The cervix was cleansed with Betadine solution. A sterile Pipelle was introduced into the uterine cavity. The uterus sounded to approximately 8 cm and moderate amount of tissue was obtained which was submitted for histological evaluation.  Assessment/Plan:  48 y.o. female for annual exam with intermenstrual bleeding. Endometrial biopsy done  today results pending at time of this dictation as well as Pap smear. Patient will return to the office in one week for a sonohysterogram to better assess for intrauterine cavity. The following labs ordered today: Fasting lipid profile, comprehensive metabolic panel, TSH, CBC, urinalysis. Prescription refill for Synthroid was provided. We discussed importance of calcium vitamin D and regular exercise for osteoporosis prevention.  Note: This dictation was prepared with  Dragon/digital dictation along withSmart phrase technology. Any transcriptional errors that result from this process are unintentional.   Terrance Mass MD, 9:32 AM 01/29/2014

## 2014-01-30 LAB — URINALYSIS W MICROSCOPIC + REFLEX CULTURE
BACTERIA UA: NONE SEEN
Bilirubin Urine: NEGATIVE
Casts: NONE SEEN
Crystals: NONE SEEN
Glucose, UA: NEGATIVE mg/dL
HGB URINE DIPSTICK: NEGATIVE
KETONES UR: NEGATIVE mg/dL
LEUKOCYTES UA: NEGATIVE
NITRITE: NEGATIVE
PROTEIN: NEGATIVE mg/dL
Specific Gravity, Urine: 1.029 (ref 1.005–1.030)
UROBILINOGEN UA: 0.2 mg/dL (ref 0.0–1.0)
pH: 7 (ref 5.0–8.0)

## 2014-01-31 LAB — CYTOLOGY - PAP

## 2014-02-10 ENCOUNTER — Other Ambulatory Visit: Payer: Self-pay | Admitting: Gynecology

## 2014-02-10 DIAGNOSIS — D251 Intramural leiomyoma of uterus: Secondary | ICD-10-CM

## 2014-02-10 DIAGNOSIS — N938 Other specified abnormal uterine and vaginal bleeding: Secondary | ICD-10-CM

## 2014-02-17 ENCOUNTER — Other Ambulatory Visit: Payer: Managed Care, Other (non HMO)

## 2014-02-17 ENCOUNTER — Ambulatory Visit: Payer: Managed Care, Other (non HMO) | Admitting: Gynecology

## 2014-02-19 ENCOUNTER — Ambulatory Visit (INDEPENDENT_AMBULATORY_CARE_PROVIDER_SITE_OTHER): Payer: Managed Care, Other (non HMO)

## 2014-02-19 ENCOUNTER — Other Ambulatory Visit: Payer: Self-pay | Admitting: Gynecology

## 2014-02-19 ENCOUNTER — Ambulatory Visit (INDEPENDENT_AMBULATORY_CARE_PROVIDER_SITE_OTHER): Payer: Managed Care, Other (non HMO) | Admitting: Gynecology

## 2014-02-19 DIAGNOSIS — D251 Intramural leiomyoma of uterus: Secondary | ICD-10-CM

## 2014-02-19 DIAGNOSIS — N8300 Follicular cyst of ovary, unspecified side: Secondary | ICD-10-CM

## 2014-02-19 DIAGNOSIS — N938 Other specified abnormal uterine and vaginal bleeding: Secondary | ICD-10-CM

## 2014-02-19 DIAGNOSIS — N921 Excessive and frequent menstruation with irregular cycle: Secondary | ICD-10-CM

## 2014-02-19 DIAGNOSIS — N949 Unspecified condition associated with female genital organs and menstrual cycle: Secondary | ICD-10-CM

## 2014-02-19 DIAGNOSIS — E039 Hypothyroidism, unspecified: Secondary | ICD-10-CM

## 2014-02-19 DIAGNOSIS — N923 Ovulation bleeding: Secondary | ICD-10-CM

## 2014-02-19 MED ORDER — PROGESTERONE MICRONIZED 200 MG PO CAPS: 200.0000 mg | ORAL_CAPSULE | Freq: Every day | ORAL | Status: DC

## 2014-02-19 NOTE — Progress Notes (Signed)
  [facility]  48 year old patient who presented to the office today for a sonohysterogram in further discussion of recent endometrial biopsy. The biopsy had been done at the time of her annual exam in July 22 because patient stated that she had some spotting in between cycles for past several months. Patient with history of hypothyroidism who was currently on levothyroxine 75 mcg daily but due to  recent TSH found to be elevated at 4.502  the medicine was adjusted to 88 mcg daily. A recent endometrial biopsy was benign. Patient several years ago had endometrial ablation.  Ultrasound today: Uterus measured 11.0 x 7.2 x 6.7 cm with endometrial stripe of 3.9 mm. The patient was found to have 3 fibroids intramural largest one measuring 33 x 31 mm. Endometrium fluid-filled (patient recently completed menstrual cycle). Right and left lower were normal. After cleansing the cervix with Betadine solution and instilling a sterile catheter and normal saline there was no intracavitary uterine defect noted.  Assessment/plan: Intermenstrual spotting for a few months negative workup consisting of endometrial biopsy, Pap smear and now sonohysterogram. Patient was reassured. She'll maintain menstrual calendar. We are going to start her on Prometrium 200 mg each bedtime for 12 days of each month for the next 3 months to stabilize her endometrium. She'll return back next month for a TSH since we had just adjusted her levothyroxin.

## 2014-03-03 ENCOUNTER — Other Ambulatory Visit: Payer: Managed Care, Other (non HMO)

## 2014-03-03 DIAGNOSIS — R7989 Other specified abnormal findings of blood chemistry: Secondary | ICD-10-CM

## 2014-03-03 LAB — TSH: TSH: 1.526 u[IU]/mL (ref 0.350–4.500)

## 2014-03-05 ENCOUNTER — Telehealth: Payer: Self-pay | Admitting: *Deleted

## 2014-03-05 MED ORDER — LEVOTHYROXINE SODIUM 88 MCG PO TABS: 88.0000 ug | ORAL_TABLET | Freq: Every day | ORAL | Status: DC

## 2014-03-05 NOTE — Telephone Encounter (Signed)
Pt called to get TSH level recheck. TSH level normal pt will stay on current dose. 90 day supply of synthroid 88 mcg will be sent to mail order.

## 2014-03-14 ENCOUNTER — Telehealth: Payer: Self-pay

## 2014-03-14 NOTE — Telephone Encounter (Signed)
That would be fine 

## 2014-03-14 NOTE — Telephone Encounter (Signed)
Was given Prometrium at her August visit and forgot when she is supposed to take it. I called her back and advised she should take it 1st 12 days of the month. She said then she messed up because she did not start if Sept 1.  I told her it would be fine to start it today for 12 days and next month just to go back to 1-12 of month.  Wanted to make sure that was ok with you?

## 2014-03-31 ENCOUNTER — Encounter: Payer: Self-pay | Admitting: Gynecology

## 2014-05-12 ENCOUNTER — Encounter: Payer: Self-pay | Admitting: Gynecology

## 2015-04-13 ENCOUNTER — Telehealth: Payer: Self-pay | Admitting: *Deleted

## 2015-04-13 MED ORDER — LEVOTHYROXINE SODIUM 88 MCG PO TABS: 88.0000 ug | ORAL_TABLET | Freq: Every day | ORAL | Status: DC

## 2015-04-13 NOTE — Telephone Encounter (Signed)
Pt called requesting refill on synthroid 88 mcg, annual exam schedule on 04/20/15, Rx sent

## 2015-04-14 ENCOUNTER — Encounter: Payer: Self-pay | Admitting: Gynecology

## 2015-04-20 ENCOUNTER — Encounter: Payer: Self-pay | Admitting: Gynecology

## 2015-04-20 ENCOUNTER — Ambulatory Visit (INDEPENDENT_AMBULATORY_CARE_PROVIDER_SITE_OTHER): Payer: 59 | Admitting: Gynecology

## 2015-04-20 VITALS — BP 128/84 | Ht 67.0 in | Wt 203.0 lb

## 2015-04-20 DIAGNOSIS — Z01419 Encounter for gynecological examination (general) (routine) without abnormal findings: Secondary | ICD-10-CM | POA: Diagnosis not present

## 2015-04-20 DIAGNOSIS — E038 Other specified hypothyroidism: Secondary | ICD-10-CM | POA: Diagnosis not present

## 2015-04-20 DIAGNOSIS — Z23 Encounter for immunization: Secondary | ICD-10-CM | POA: Diagnosis not present

## 2015-04-20 LAB — CBC WITH DIFFERENTIAL/PLATELET
Basophils Absolute: 0.1 10*3/uL (ref 0.0–0.1)
Basophils Relative: 2 % — ABNORMAL HIGH (ref 0–1)
EOS ABS: 0.6 10*3/uL (ref 0.0–0.7)
EOS PCT: 10 % — AB (ref 0–5)
HCT: 43.6 % (ref 36.0–46.0)
Hemoglobin: 14.6 g/dL (ref 12.0–15.0)
Lymphocytes Relative: 28 % (ref 12–46)
Lymphs Abs: 1.6 10*3/uL (ref 0.7–4.0)
MCH: 28.6 pg (ref 26.0–34.0)
MCHC: 33.5 g/dL (ref 30.0–36.0)
MCV: 85.3 fL (ref 78.0–100.0)
MONOS PCT: 10 % (ref 3–12)
MPV: 10.3 fL (ref 8.6–12.4)
Monocytes Absolute: 0.6 10*3/uL (ref 0.1–1.0)
NEUTROS ABS: 2.9 10*3/uL (ref 1.7–7.7)
NEUTROS PCT: 50 % (ref 43–77)
PLATELETS: 204 10*3/uL (ref 150–400)
RBC: 5.11 MIL/uL (ref 3.87–5.11)
RDW: 13.3 % (ref 11.5–15.5)
WBC: 5.7 10*3/uL (ref 4.0–10.5)

## 2015-04-20 LAB — COMPREHENSIVE METABOLIC PANEL
ALT: 14 U/L (ref 6–29)
AST: 18 U/L (ref 10–35)
Albumin: 4.2 g/dL (ref 3.6–5.1)
Alkaline Phosphatase: 57 U/L (ref 33–115)
BUN: 14 mg/dL (ref 7–25)
CO2: 28 mmol/L (ref 20–31)
Calcium: 9.6 mg/dL (ref 8.6–10.2)
Chloride: 103 mmol/L (ref 98–110)
Creat: 0.72 mg/dL (ref 0.50–1.10)
GLUCOSE: 89 mg/dL (ref 65–99)
POTASSIUM: 3.8 mmol/L (ref 3.5–5.3)
Sodium: 138 mmol/L (ref 135–146)
Total Bilirubin: 0.8 mg/dL (ref 0.2–1.2)
Total Protein: 6.5 g/dL (ref 6.1–8.1)

## 2015-04-20 LAB — LIPID PANEL
CHOL/HDL RATIO: 3 ratio (ref <=5.0)
Cholesterol: 176 mg/dL (ref 125–200)
HDL: 59 mg/dL (ref >=46)
LDL CALC: 107 mg/dL (ref <130)
Triglycerides: 50 mg/dL (ref <150)
VLDL: 10 mg/dL (ref <30)

## 2015-04-20 NOTE — Addendum Note (Signed)
Addended by: Thurnell Garbe A on: 04/20/2015 09:13 AM   Modules accepted: Orders, SmartSet

## 2015-04-20 NOTE — Progress Notes (Signed)
Erica Hull 21-May-1966 [id number]   History:    49 y.o.  for annual gyn exam with no complaints today. Patient last years a result of dysfunction uterine bleeding had an endometrial biopsy and a sonohysterogram all were normal. Patient does suffer from hypothyroidism and after her Synthroid medication was adjusted her cycles of been normal since then. Patient has had a past history of a tubal ligation. No past history of any abnormal Pap smear.Patient's past obstetrical history as follows:  First pregnancy elective AB  Second pregnancy elective AB  Third pregnancy C-section  Fourth pregnancy missed AB  The patient has had prior history of endometrial ablation. The patient had an ultrasound in 2015 which demonstrated the following: Uterus measured 11.0 x 7.2 x 6.7 cm with endometrial stripe of 3.9 mm. The patient was found to have 3 fibroids intramural largest one measuring 33 x 31 mm.  Past medical history,surgical history, family history and social history were all reviewed and documented in the EPIC chart.  Gynecologic History Patient's last menstrual period was 04/02/2015. Contraception: tubal ligation Last Pap: 2015. Results were: normal Last mammogram: 2016. Results were: normal  Obstetric History OB History  Gravida Para Term Preterm AB SAB TAB Ectopic Multiple Living  4 2 2  2     2     # Outcome Date GA Lbr Len/2nd Weight Sex Delivery Anes PTL Lv  4 AB           3 AB           2 Term     F CS-Unspec  N Y  1 Term     M CS-Unspec  N Y       ROS: A ROS was performed and pertinent positives and negatives are included in the history.  GENERAL: No fevers or chills. HEENT: No change in vision, no earache, sore throat or sinus congestion. NECK: No pain or stiffness. CARDIOVASCULAR: No chest pain or pressure. No palpitations. PULMONARY: No shortness of breath, cough or wheeze. GASTROINTESTINAL: No abdominal pain, nausea, vomiting or diarrhea, melena or bright red blood  per rectum. GENITOURINARY: No urinary frequency, urgency, hesitancy or dysuria. MUSCULOSKELETAL: No joint or muscle pain, no back pain, no recent trauma. DERMATOLOGIC: No rash, no itching, no lesions. ENDOCRINE: No polyuria, polydipsia, no heat or cold intolerance. No recent change in weight. HEMATOLOGICAL: No anemia or easy bruising or bleeding. NEUROLOGIC: No headache, seizures, numbness, tingling or weakness. PSYCHIATRIC: No depression, no loss of interest in normal activity or change in sleep pattern.     Exam: chaperone present  BP 128/84 mmHg  Ht 5\' 7"  (1.702 m)  Wt 203 lb (92.08 kg)  BMI 31.79 kg/m2  LMP 04/02/2015  Body mass index is 31.79 kg/(m^2).  General appearance : Well developed well nourished female. No acute distress HEENT: Eyes: no retinal hemorrhage or exudates,  Neck supple, trachea midline, no carotid bruits, no thyroidmegaly Lungs: Clear to auscultation, no rhonchi or wheezes, or rib retractions  Heart: Regular rate and rhythm, no murmurs or gallops Breast:Examined in sitting and supine position were symmetrical in appearance, no palpable masses or tenderness,  no skin retraction, no nipple inversion, no nipple discharge, no skin discoloration, no axillary or supraclavicular lymphadenopathy Abdomen: no palpable masses or tenderness, no rebound or guarding Extremities: no edema or skin discoloration or tenderness  Pelvic:  Bartholin, Urethra, Skene Glands: Within normal limits             Vagina: No gross lesions  or discharge  Cervix: No gross lesions or discharge  Uterus  12 week size uterus nontender  Adnexa  Without masses or tenderness  Anus and perineum  normal   Rectovaginal  normal sphincter tone without palpated masses or tenderness             Hemoccult not indicated     Assessment/Plan:  49 y.o. female for annual exam will receive her flu vaccine today along with the following screening blood work: Comprehensive metabolic panel, fasting lipid profile,  TSH, CBC, and urinalysis. Pap smear not indicated this year. Patient was encouraged to do her monthly breast examination. Patient currently on Synthroid 88 g daily.   Terrance Mass MD, 8:52 AM 04/20/2015

## 2015-04-21 LAB — URINALYSIS W MICROSCOPIC + REFLEX CULTURE
BACTERIA UA: NONE SEEN [HPF]
BILIRUBIN URINE: NEGATIVE
Casts: NONE SEEN [LPF]
Crystals: NONE SEEN [HPF]
Glucose, UA: NEGATIVE
Hgb urine dipstick: NEGATIVE
Ketones, ur: NEGATIVE
Leukocytes, UA: NEGATIVE
Nitrite: NEGATIVE
PH: 6 (ref 5.0–8.0)
Protein, ur: NEGATIVE
RBC / HPF: NONE SEEN RBC/HPF (ref <=2)
SPECIFIC GRAVITY, URINE: 1.026 (ref 1.001–1.035)
Yeast: NONE SEEN [HPF]

## 2015-04-21 LAB — TSH: TSH: 2.298 u[IU]/mL (ref 0.350–4.500)

## 2015-04-22 LAB — URINE CULTURE: Colony Count: 30000

## 2015-06-24 ENCOUNTER — Other Ambulatory Visit: Payer: Self-pay | Admitting: Gynecology

## 2016-06-08 ENCOUNTER — Encounter: Payer: Self-pay | Admitting: Gynecology

## 2016-08-08 ENCOUNTER — Telehealth: Payer: Self-pay | Admitting: *Deleted

## 2016-08-08 MED ORDER — LEVOTHYROXINE SODIUM 88 MCG PO TABS: 88.0000 ug | ORAL_TABLET | Freq: Every day | ORAL | 0 refills | Status: DC

## 2016-08-08 NOTE — Telephone Encounter (Signed)
Pt aware, Rx sent to local pharmacy.

## 2016-08-08 NOTE — Telephone Encounter (Signed)
Yes

## 2016-08-08 NOTE — Telephone Encounter (Signed)
Pt has annual scheduled on 09/07/16, needs refill on synthroid 88 mcg, last seen for annual in 2016. Okay to refill? Please advise

## 2016-08-10 ENCOUNTER — Other Ambulatory Visit: Payer: Self-pay

## 2016-08-10 MED ORDER — LEVOTHYROXINE SODIUM 88 MCG PO TABS: 88.0000 ug | ORAL_TABLET | Freq: Every day | ORAL | 0 refills | Status: DC

## 2016-09-05 ENCOUNTER — Other Ambulatory Visit: Payer: Self-pay | Admitting: Gynecology

## 2016-09-07 ENCOUNTER — Telehealth: Payer: Self-pay

## 2016-09-07 ENCOUNTER — Encounter: Payer: 59 | Admitting: Gynecology

## 2016-09-07 MED ORDER — LEVOTHYROXINE SODIUM 88 MCG PO TABS: ORAL_TABLET | ORAL | 0 refills | Status: DC

## 2016-09-07 NOTE — Telephone Encounter (Signed)
Note from pharmacy received regarding Levothyroxine 88 mcg  Prescription sent recently. "New Rx for 90 days supply please. Insurance will on pay for 90 days supply."  Patient is scheduled for CE on March 13. 90 day supply sent. Note added to Rx that patient must keep scheduled appt for addt'l refill.

## 2016-09-20 ENCOUNTER — Encounter: Payer: 59 | Admitting: Gynecology

## 2016-10-05 ENCOUNTER — Encounter: Payer: 59 | Admitting: Gynecology

## 2016-11-03 ENCOUNTER — Encounter: Payer: 59 | Admitting: Gynecology

## 2016-11-15 ENCOUNTER — Ambulatory Visit (INDEPENDENT_AMBULATORY_CARE_PROVIDER_SITE_OTHER): Payer: 59 | Admitting: Gynecology

## 2016-11-15 ENCOUNTER — Telehealth: Payer: Self-pay | Admitting: *Deleted

## 2016-11-15 ENCOUNTER — Encounter: Payer: Self-pay | Admitting: Gynecology

## 2016-11-15 VITALS — BP 126/82 | Ht 67.0 in | Wt 203.4 lb

## 2016-11-15 DIAGNOSIS — N951 Menopausal and female climacteric states: Secondary | ICD-10-CM

## 2016-11-15 DIAGNOSIS — N938 Other specified abnormal uterine and vaginal bleeding: Secondary | ICD-10-CM | POA: Diagnosis not present

## 2016-11-15 DIAGNOSIS — I998 Other disorder of circulatory system: Secondary | ICD-10-CM

## 2016-11-15 DIAGNOSIS — E038 Other specified hypothyroidism: Secondary | ICD-10-CM

## 2016-11-15 DIAGNOSIS — Z01419 Encounter for gynecological examination (general) (routine) without abnormal findings: Secondary | ICD-10-CM | POA: Diagnosis not present

## 2016-11-15 DIAGNOSIS — D251 Intramural leiomyoma of uterus: Secondary | ICD-10-CM | POA: Diagnosis not present

## 2016-11-15 MED ORDER — LEVOTHYROXINE SODIUM 88 MCG PO TABS: 88.0000 ug | ORAL_TABLET | Freq: Every day | ORAL | 4 refills | Status: DC

## 2016-11-15 NOTE — Progress Notes (Signed)
Monroe Toure Stettler [birth year] [id number]   History:    51 y.o.  for annual gyn exam who states that her cycles are sometimes heavy with passage of large clots and for several months she once again has had intermenstrual spotting that would last for a couple days. She is on no hormone replacement therapy she does have history of hypothyroidism. She has had appears tubal ligation the past.No past history of any abnormal Pap smear.Patient's past obstetrical history as follows:  First pregnancy elective AB  Second pregnancy elective AB  Third pregnancy C-section  Fourth pregnancy missed AB  The patient has had prior history of endometrial ablation. The patient had an ultrasound in 2015 which demonstrated the following: Uterus measured 11.0 x 7.2 x 6.7 cm with endometrial stripe of 3.9 mm. The patient was found to have 3 fibroids intramural largest one measuring 33 x 31 mm.  She has not had a colonoscopy as of yet. Patient also complaining of lower extremity edema or varicosity. She has gone to a vascular specialist but not a vascular surgeon and she has seen no results and has had no procedures done.   Past medical history,surgical history, family history and social history were all reviewed and documented in the EPIC chart.  Gynecologic History Patient's last menstrual period was 10/31/2016 (exact date). Contraception: tubal ligation Last Pap: 2015. Results were: normal Last mammogram: 2017. Results were: normal  Obstetric History OB History  Gravida Para Term Preterm AB Living  4 2 2   2 2   SAB TAB Ectopic Multiple Live Births          2    # Outcome Date GA Lbr Len/2nd Weight Sex Delivery Anes PTL Lv  4 AB           3 AB           2 Term     F CS-Unspec  N LIV  1 Term     M CS-Unspec  N LIV       ROS: A ROS was performed and pertinent positives and negatives are included in the history.  GENERAL: No fevers or chills. HEENT: No change in vision, no earache, sore throat  or sinus congestion. NECK: No pain or stiffness. CARDIOVASCULAR: No chest pain or pressure. No palpitations. PULMONARY: No shortness of breath, cough or wheeze. GASTROINTESTINAL: No abdominal pain, nausea, vomiting or diarrhea, melena or bright red blood per rectum. GENITOURINARY: No urinary frequency, urgency, hesitancy or dysuria. MUSCULOSKELETAL: No joint or muscle pain, no back pain, no recent trauma. DERMATOLOGIC: No rash, no itching, no lesions. ENDOCRINE: No polyuria, polydipsia, no heat or cold intolerance. No recent change in weight. HEMATOLOGICAL: No anemia or easy bruising or bleeding. NEUROLOGIC: No headache, seizures, numbness, tingling or weakness. PSYCHIATRIC: No depression, no loss of interest in normal activity or change in sleep pattern.     Exam: chaperone present  BP 126/82   Ht 5\' 7"  (1.702 m)   Wt 203 lb 6.4 oz (92.3 kg)   LMP 10/31/2016 (Exact Date)   BMI 31.86 kg/m   Body mass index is 31.86 kg/m.  General appearance : Well developed well nourished female. No acute distress HEENT: Eyes: no retinal hemorrhage or exudates,  Neck supple, trachea midline, no carotid bruits, no thyroidmegaly Lungs: Clear to auscultation, no rhonchi or wheezes, or rib retractions  Heart: Regular rate and rhythm, no murmurs or gallops Breast:Examined in sitting and supine position were symmetrical in appearance, no palpable masses or  tenderness,  no skin retraction, no nipple inversion, no nipple discharge, no skin discoloration, no axillary or supraclavicular lymphadenopathy Abdomen: no palpable masses or tenderness, no rebound or guarding Extremities: Evidence of bilateral lower extremity edema and varicosity consistent with vascular insufficiency Pelvic:  Bartholin, Urethra, Skene Glands: Within normal limits             Vagina: No gross lesions or discharge  Cervix: No gross lesions or discharge  Uterus  anteverted, normal size, shape and consistency, non-tender and mobile  Adnexa   Without masses or tenderness  Anus and perineum  normal   Rectovaginal  normal sphincter tone without palpated masses or tenderness             Hemoccult patient will be referred to gastrologist for her colonoscopy this year   After patient was counseled she underwent an endometrial biopsy in the following fashion: The cervix was cleansed with Betadine solution and a sterile Pipelle was introduced into the uterine cavity and tissue was obtained symmetrical for histological evaluation. Patient tolerated procedure well.  Assessment/Plan:  51 y.o. female for annual exam with dysfunctional uterine bleeding perimenopausal for this reason an endometrial biopsy was done today along with her Pap smear result pending at time of this dictation. The following screening lab work in a fasting state was ordered today: Lipid profile, CBC, TSH, comprehensive metabolic panel, urinalysis and since she is perimenopausal and FSH was obtained today as well. comprehensive metabolic panel, patient will return to the office next week for sonohysterogram for further evaluation of her intermenstrual bleeding will notify the patient is a pathology report becomes available. Prescription refill for her Synthroid was provided. She was provided with information to schedule her screening colonoscopy and I'm going to refer her to avascular surgeon for further evaluation of her lower extremity vascular insufficiency.   Terrance Mass MD, 10:27 AM 11/15/2016

## 2016-11-15 NOTE — Telephone Encounter (Signed)
Pt was seen today and told to call [facility] for new patient appointment. Pt called and they need referral. I faxed notes with referral form. They will contact pt to schedule and fax me back with time and date.

## 2016-11-15 NOTE — Patient Instructions (Signed)

## 2016-11-16 LAB — URINALYSIS W MICROSCOPIC + REFLEX CULTURE
BILIRUBIN URINE: NEGATIVE
Bacteria, UA: NONE SEEN [HPF]
Casts: NONE SEEN [LPF]
Crystals: NONE SEEN [HPF]
GLUCOSE, UA: NEGATIVE
HGB URINE DIPSTICK: NEGATIVE
KETONES UR: NEGATIVE
LEUKOCYTES UA: NEGATIVE
NITRITE: NEGATIVE
PH: 6.5 (ref 5.0–8.0)
Protein, ur: NEGATIVE
RBC / HPF: NONE SEEN RBC/HPF (ref <=2)
Specific Gravity, Urine: 1.019 (ref 1.001–1.035)
Yeast: NONE SEEN [HPF]

## 2016-11-17 LAB — PAP IG W/ RFLX HPV ASCU

## 2016-11-17 LAB — URINE CULTURE: ORGANISM ID, BACTERIA: NO GROWTH

## 2016-11-23 ENCOUNTER — Encounter: Payer: Self-pay | Admitting: Gynecology

## 2016-11-24 NOTE — Telephone Encounter (Signed)
[facility] has left message for pt to call.

## 2016-11-28 ENCOUNTER — Other Ambulatory Visit: Payer: Self-pay | Admitting: Gynecology

## 2016-11-28 DIAGNOSIS — N939 Abnormal uterine and vaginal bleeding, unspecified: Secondary | ICD-10-CM

## 2016-11-28 NOTE — Telephone Encounter (Signed)
Appointment on 01/24/17 with Dr.Todd Early

## 2016-12-01 ENCOUNTER — Other Ambulatory Visit: Payer: Self-pay | Admitting: *Deleted

## 2016-12-01 DIAGNOSIS — I83813 Varicose veins of bilateral lower extremities with pain: Secondary | ICD-10-CM

## 2016-12-12 ENCOUNTER — Ambulatory Visit: Payer: 59 | Admitting: Gynecology

## 2016-12-12 ENCOUNTER — Other Ambulatory Visit: Payer: 59

## 2016-12-12 LAB — CBC WITH DIFFERENTIAL/PLATELET
BASOS PCT: 1 %
Basophils Absolute: 52 cells/uL (ref 0–200)
EOS ABS: 156 {cells}/uL (ref 15–500)
Eosinophils Relative: 3 %
HEMATOCRIT: 42.9 % (ref 35.0–45.0)
Hemoglobin: 14 g/dL (ref 11.7–15.5)
LYMPHS PCT: 27 %
Lymphs Abs: 1404 cells/uL (ref 850–3900)
MCH: 28.8 pg (ref 27.0–33.0)
MCHC: 32.6 g/dL (ref 32.0–36.0)
MCV: 88.3 fL (ref 80.0–100.0)
MONO ABS: 572 {cells}/uL (ref 200–950)
MPV: 10.2 fL (ref 7.5–12.5)
Monocytes Relative: 11 %
NEUTROS PCT: 58 %
Neutro Abs: 3016 cells/uL (ref 1500–7800)
Platelets: 207 10*3/uL (ref 140–400)
RBC: 4.86 MIL/uL (ref 3.80–5.10)
RDW: 13.6 % (ref 11.0–15.0)
WBC: 5.2 10*3/uL (ref 3.8–10.8)

## 2016-12-12 LAB — COMPREHENSIVE METABOLIC PANEL
ALK PHOS: 56 U/L (ref 33–130)
ALT: 16 U/L (ref 6–29)
AST: 21 U/L (ref 10–35)
Albumin: 3.8 g/dL (ref 3.6–5.1)
BUN: 12 mg/dL (ref 7–25)
CALCIUM: 9.3 mg/dL (ref 8.6–10.4)
CHLORIDE: 106 mmol/L (ref 98–110)
CO2: 28 mmol/L (ref 20–31)
Creat: 0.71 mg/dL (ref 0.50–1.05)
GLUCOSE: 85 mg/dL (ref 65–99)
Potassium: 4 mmol/L (ref 3.5–5.3)
Sodium: 140 mmol/L (ref 135–146)
Total Bilirubin: 0.6 mg/dL (ref 0.2–1.2)
Total Protein: 6.3 g/dL (ref 6.1–8.1)

## 2016-12-12 LAB — LIPID PANEL
CHOL/HDL RATIO: 2.7 ratio (ref <5.0)
Cholesterol: 168 mg/dL (ref <200)
HDL: 62 mg/dL (ref >50)
LDL Cholesterol: 95 mg/dL (ref <100)
Triglycerides: 56 mg/dL (ref <150)
VLDL: 11 mg/dL (ref <30)

## 2016-12-12 LAB — TSH: TSH: 2.83 m[IU]/L

## 2016-12-12 LAB — FOLLICLE STIMULATING HORMONE: FSH: 12.6 m[IU]/mL

## 2017-01-13 ENCOUNTER — Encounter: Payer: Self-pay | Admitting: Vascular Surgery

## 2017-01-24 ENCOUNTER — Encounter: Payer: Self-pay | Admitting: Vascular Surgery

## 2017-01-24 ENCOUNTER — Ambulatory Visit (HOSPITAL_COMMUNITY)
Admission: RE | Admit: 2017-01-24 | Discharge: 2017-01-24 | Disposition: A | Discharge status: Home / Self Care | Payer: 59 | Source: Ambulatory Visit | Attending: Vascular Surgery | Admitting: Vascular Surgery

## 2017-01-24 ENCOUNTER — Ambulatory Visit (INDEPENDENT_AMBULATORY_CARE_PROVIDER_SITE_OTHER): Payer: 59 | Admitting: Vascular Surgery

## 2017-01-24 VITALS — BP 135/86 | HR 73 | Temp 97.3°F | Resp 16 | Ht 67.0 in | Wt 206.0 lb

## 2017-01-24 DIAGNOSIS — I83813 Varicose veins of bilateral lower extremities with pain: Secondary | ICD-10-CM | POA: Diagnosis present

## 2017-01-24 DIAGNOSIS — I87303 Chronic venous hypertension (idiopathic) without complications of bilateral lower extremity: Secondary | ICD-10-CM

## 2017-01-24 NOTE — Progress Notes (Signed)
[facility] [place]  Patient name: Erica Hull MRN: [id number] DOB: 1965/09/03 Sex: female  REASON FOR CONSULT: Evaluation lower extremity venous hypertension  HPI: Erica Hull is a 51 y.o. female, who is seen today for discussion of bilateral [place] venous hypertension. She reports swelling and achy sensation over both lower extremities. She had extensive workup and treatment at an outlying vein Center in the past. This was in the 2014, through 2016. She had ablation on 2 occasions on her left great saphenous vein after incomplete closure on the first treatment. She had known reflux and small saphenous vein on the left and also some reflux in her great saphenous vein on the right. He continues to have swelling in both lower extremities. This is more so on her left than on her right. Or swelling throughout her thighs and into her calves and ankles. He has no history of DVT and no history of arterial insufficiency.  Past Medical History:  Diagnosis Date  . Elective abortion    X2  . H/O peripheral vascular disease 2006  . Hx of cystitis 2006  . Hypertension    pregnancy induced  . Hypothyroidism   . Missed ab     Family History  Problem Relation Age of Onset  . Hypertension Father   . Cancer Father 62       ureter  . Atrial fibrillation Father   . Rheum arthritis Mother   . COPD Mother   . Dementia Mother     SOCIAL HISTORY: Social History   Social History  . Marital status: Married    Spouse name: N/A  . Number of children: N/A  . Years of education: N/A   Occupational History  . Not on file.   Social History Main Topics  . Smoking status: Never Smoker  . Smokeless tobacco: Never Used  . Alcohol use 0.0 oz/week     Comment: rare  . Drug use: No  . Sexual activity: Yes    Birth control/ protection: Surgical     Comment: TUBAL LIGATION   Other Topics Concern  . Not on file   Social History  Narrative  . No narrative on file    No Known Allergies  Current Outpatient Prescriptions  Medication Sig Dispense Refill  . Biotin 5000 MCG TABS Take 2 tablets by mouth daily.    . Cyanocobalamin (VITAMIN B 12 PO) Take 1 tablet by mouth daily.    . Ginkgo Biloba 40 MG TABS Take 1 tablet by mouth daily.    Marland Kitchen levothyroxine (SYNTHROID) 88 MCG tablet Take 1 tablet (88 mcg total) by mouth daily before breakfast. 90 tablet 4  . Multiple Vitamins-Minerals (MULTIVITAMIN WITH MINERALS) tablet Take 1 tablet by mouth daily.    . Omega-3 Fatty Acids (FISH OIL) 1200 MG CAPS Take 1 capsule by mouth daily.    Marland Kitchen OVER THE COUNTER MEDICATION peppermint pill     No current facility-administered medications for this visit.     REVIEW OF SYSTEMS:  [X]  denotes positive finding, [ ]  denotes negative finding Cardiac  Comments:  Chest pain or chest pressure:    Shortness of breath upon exertion:    Short of breath when lying flat:    Irregular heart rhythm:        Vascular    Pain in calf, thigh, or hip brought on by ambulation:    Pain in feet at night that wakes you up from your sleep:  Blood clot in your veins:    Leg swelling:  x       Pulmonary    Oxygen at home:    Productive cough:     Wheezing:         Neurologic    Sudden weakness in arms or legs:     Sudden numbness in arms or legs:     Sudden onset of difficulty speaking or slurred speech:    Temporary loss of vision in one eye:     Problems with dizziness:         Gastrointestinal    Blood in stool:     Vomited blood:         Genitourinary    Burning when urinating:     Blood in urine:        Psychiatric    Major depression:         Hematologic    Bleeding problems:    Problems with blood clotting too easily:        Skin    Rashes or ulcers:        Constitutional    Fever or chills:      PHYSICAL EXAM: Vitals:   01/24/17 1136  BP: 135/86  Pulse: 73  Resp: 16  Temp: (!) 97.3 F (36.3 C)  SpO2: 99%    Weight: 206 lb (93.4 kg)  Height: 5\' 7"  (1.702 m)    GENERAL: The patient is a well-nourished female, in no acute distress. The vital signs are documented above. CARDIOVASCULAR: 2+ radial and 2+ dorsalis pedis pulses bilaterally. No evidence of varicosities. Scattered telangiectasia. Does have a swelling in both lower extremities PULMONARY: There is good air exchange  ABDOMEN: Soft and non-tender  MUSCULOSKELETAL: There are no major deformities or cyanosis. NEUROLOGIC: No focal weakness or paresthesias are detected. SKIN: There are no ulcers or rashes noted. PSYCHIATRIC: The patient has a normal affect.  DATA:  Formal venous duplex reveals ablation of her left great saphenous vein. She does have reflux in her small saphenous vein but the diameter is not large. She does have reflux at her proximal great saphenous vein. This is on the right.  MEDICAL ISSUES: I discussed this missed of his health patient. She had really no definitive from her left great saphenous vein closure. She has less difficulty on her right side. I feel that she would have very little chance for improvement with ablation of her left small saphenous or her right great saphenous vein. She is comfortable with this discussion. I have recommended that she continue elevation and compression.-She could always of consideration of this in the future but that fact that she had minimal improvement with her prior left leg treatment at an outlying center with suggest minimal chance for improvement with further treatment. We will see her again on an as-needed basis   Rosetta Posner, MD [facility] Tel [phone] Pager [phone]

## 2017-01-26 ENCOUNTER — Encounter: Payer: Self-pay | Admitting: Family Medicine

## 2017-12-06 ENCOUNTER — Other Ambulatory Visit: Payer: Self-pay

## 2018-01-30 ENCOUNTER — Encounter: Payer: Self-pay | Admitting: Anesthesiology

## 2018-02-13 ENCOUNTER — Other Ambulatory Visit: Payer: Self-pay

## 2018-02-13 NOTE — Telephone Encounter (Signed)
Former [facility] patient. Last CE 11/15/16. Overdue for annual exam.  Has appt scheduled with you for CE on 05/24/18.

## 2018-02-14 MED ORDER — LEVOTHYROXINE SODIUM 88 MCG PO TABS: 88.0000 ug | ORAL_TABLET | Freq: Every day | ORAL | 1 refills | Status: DC

## 2018-05-24 ENCOUNTER — Ambulatory Visit (INDEPENDENT_AMBULATORY_CARE_PROVIDER_SITE_OTHER): Payer: 59 | Admitting: Obstetrics & Gynecology

## 2018-05-24 ENCOUNTER — Encounter: Payer: Self-pay | Admitting: Obstetrics & Gynecology

## 2018-05-24 VITALS — BP 132/84 | Ht 66.5 in | Wt 209.0 lb

## 2018-05-24 DIAGNOSIS — Z6833 Body mass index (BMI) 33.0-33.9, adult: Secondary | ICD-10-CM

## 2018-05-24 DIAGNOSIS — N920 Excessive and frequent menstruation with regular cycle: Secondary | ICD-10-CM | POA: Diagnosis not present

## 2018-05-24 DIAGNOSIS — Z01419 Encounter for gynecological examination (general) (routine) without abnormal findings: Secondary | ICD-10-CM | POA: Diagnosis not present

## 2018-05-24 DIAGNOSIS — D219 Benign neoplasm of connective and other soft tissue, unspecified: Secondary | ICD-10-CM | POA: Diagnosis not present

## 2018-05-24 DIAGNOSIS — Z9851 Tubal ligation status: Secondary | ICD-10-CM | POA: Diagnosis not present

## 2018-05-24 DIAGNOSIS — E6609 Other obesity due to excess calories: Secondary | ICD-10-CM

## 2018-05-24 NOTE — Patient Instructions (Signed)
  1. Well female exam with routine gynecological exam Gynecologic exam with uterine fibroids.  Pap test negative in May 2018.  No indication to repeat this year.  Breast exam normal.  Screening mammogram done in 2019 normal per patient, will obtain report from [place].  Will refer to gastroenterology for screening colonoscopy.  Will follow-up fasting for health labs here. - CBC; Future - Comp Met (CMET); Future - TSH; Future - Lipid panel; Future - VITAMIN D 25 Hydroxy (Vit-D Deficiency, Fractures); Future  2. S/P tubal ligation  3. Menorrhagia with regular cycle Persistent menorrhagia post endometrial ablation with known uterine fibroids.  No history of anemia.  Will do a CBC and patient will come back for a pelvic ultrasound to reevaluate the uterine fibroids and the endometrial lining. - US Transvaginal Non-OB; Future  4. Fibroids Follow-up pelvic ultrasound. - US Transvaginal Non-OB; Future  5. Class 1 obesity due to excess calories without serious comorbidity with body mass index (BMI) of 33.0 to 33.9 in adult Busy schedule with little time left for fitness.  Recommend incorporating physical activity to daily life as much as possible.  Aerobic physical activity 5 times a week and weightlifting every 2 days recommended.  Low calorie/low carb diet such as [employer] recommended.  Xolani, it was a pleasure meeting you today!  I will inform you of your results as soon as they are available.

## 2018-05-24 NOTE — Progress Notes (Signed)
Erica Hull 02/26/1966 [id number]   History:    52 y.o. [id number] Married.  S/P TL.  Son 5 yo, daughter 2 yo.  RP:  Established patient presenting for annual gyn exam   HPI: Menses unchanged but heavy flow with blood clots x 3 days, then lighter flow lasting up to 2-3 weeks every month.  Pelvic cramps with menses, no other pelvic pain.  No pain with IC.  Last Pelvic US 2015 showed uterine Fibroids.  S/P TL.  Urine/BMs wnl.  Breasts normal.  BMI 33.23 Difficult to find time to exercise.  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  Will f/u here for Fasting Health Labs.  Will schedule Colonoscopy.  Past medical history,surgical history, family history and social history were all reviewed and documented in the EPIC chart.  Gynecologic History Patient's last menstrual period was 05/02/2018. Contraception: tubal ligation Last Pap: 11/2016. Results were: Negative Last mammogram: 2019. Results were: normal per patient, will obtain report from [place]: Never Colonoscopy: Will schedule  Obstetric History OB History  Gravida Para Term Preterm AB Living  '4 2 2   2 2  '$ SAB TAB Ectopic Multiple Live Births          2    # Outcome Date GA Lbr Len/2nd Weight Sex Delivery Anes PTL Lv  4 AB           3 AB           2 Term     F CS-Unspec  N LIV  1 Term     M CS-Unspec  N LIV     ROS: A ROS was performed and pertinent positives and negatives are included in the history.  GENERAL: No fevers or chills. HEENT: No change in vision, no earache, sore throat or sinus congestion. NECK: No pain or stiffness. CARDIOVASCULAR: No chest pain or pressure. No palpitations. PULMONARY: No shortness of breath, cough or wheeze. GASTROINTESTINAL: No abdominal pain, nausea, vomiting or diarrhea, melena or bright red blood per rectum. GENITOURINARY: No urinary frequency, urgency, hesitancy or dysuria. MUSCULOSKELETAL: No joint or muscle pain, no back pain, no recent trauma. DERMATOLOGIC: No rash, no itching, no  lesions. ENDOCRINE: No polyuria, polydipsia, no heat or cold intolerance. No recent change in weight. HEMATOLOGICAL: No anemia or easy bruising or bleeding. NEUROLOGIC: No headache, seizures, numbness, tingling or weakness. PSYCHIATRIC: No depression, no loss of interest in normal activity or change in sleep pattern.     Exam:   BP 132/84   Ht 5' 6.5" (1.689 m)   Wt 209 lb (94.8 kg)   LMP 05/02/2018   BMI 33.23 kg/m   Body mass index is 33.23 kg/m.  General appearance : Well developed well nourished female. No acute distress HEENT: Eyes: no retinal hemorrhage or exudates,  Neck supple, trachea midline, no carotid bruits, no thyroidmegaly Lungs: Clear to auscultation, no rhonchi or wheezes, or rib retractions  Heart: Regular rate and rhythm, no murmurs or gallops Breast:Examined in sitting and supine position were symmetrical in appearance, no palpable masses or tenderness,  no skin retraction, no nipple inversion, no nipple discharge, no skin discoloration, no axillary or supraclavicular lymphadenopathy Abdomen: no palpable masses or tenderness, no rebound or guarding Extremities: no edema or skin discoloration or tenderness  Pelvic: Vulva: Normal             Vagina: No gross lesions or discharge  Cervix: No gross lesions or discharge  Uterus  AV, mild increase in size, nodular,  mobile, non-tender and mobile  Adnexa  Without masses or tenderness  Anus: Normal   Assessment/Plan:  52 y.o. female for annual exam   1. Well female exam with routine gynecological exam Gynecologic exam with uterine fibroids.  Pap test negative in May 2018.  No indication to repeat this year.  Breast exam normal.  Screening mammogram done in 2019 normal per patient, will obtain report from [facility].  Will refer to gastroenterology for screening colonoscopy.  Will follow-up fasting for health labs here. - CBC; Future - Comp Met (CMET); Future - TSH; Future - Lipid panel; Future - VITAMIN D 25 Hydroxy  (Vit-D Deficiency, Fractures); Future  2. S/P tubal ligation  3. Menorrhagia with regular cycle Persistent menorrhagia post endometrial ablation with known uterine fibroids.  No history of anemia.  Will do a CBC and patient will come back for a pelvic ultrasound to reevaluate the uterine fibroids and the endometrial lining. - US Transvaginal Non-OB; Future  4. Fibroids Follow-up pelvic ultrasound. - US Transvaginal Non-OB; Future  5. Class 1 obesity due to excess calories without serious comorbidity with body mass index (BMI) of 33.0 to 33.9 in adult Busy schedule with little time left for fitness.  Recommend incorporating physical activity to daily life as much as possible.  Aerobic physical activity 5 times a week and weightlifting every 2 days recommended.  Low calorie/low carb diet such as [employer] recommended.  Princess Bruins MD, 10:44 AM 05/24/2018

## 2018-05-25 ENCOUNTER — Telehealth: Payer: Self-pay | Admitting: *Deleted

## 2018-05-25 ENCOUNTER — Other Ambulatory Visit: Payer: 59

## 2018-05-25 DIAGNOSIS — Z1211 Encounter for screening for malignant neoplasm of colon: Secondary | ICD-10-CM

## 2018-05-25 DIAGNOSIS — Z01419 Encounter for gynecological examination (general) (routine) without abnormal findings: Secondary | ICD-10-CM

## 2018-05-25 NOTE — Telephone Encounter (Signed)
Referral placed at [facility] they will call to schedule.  

## 2018-05-25 NOTE — Telephone Encounter (Signed)
-----   Message from Princess Bruins, MD sent at 05/24/2018 11:08 AM EST ----- Regarding: Refer to [facility] 52 yo screening Colonoscopy

## 2018-05-26 LAB — VITAMIN D 25 HYDROXY (VIT D DEFICIENCY, FRACTURES): Vit D, 25-Hydroxy: 37 ng/mL (ref 30–100)

## 2018-05-26 LAB — COMPREHENSIVE METABOLIC PANEL
AG Ratio: 1.6 (calc) (ref 1.0–2.5)
ALBUMIN MSPROF: 3.9 g/dL (ref 3.6–5.1)
ALKALINE PHOSPHATASE (APISO): 52 U/L (ref 33–130)
ALT: 11 U/L (ref 6–29)
AST: 16 U/L (ref 10–35)
BUN: 12 mg/dL (ref 7–25)
CO2: 26 mmol/L (ref 20–32)
CREATININE: 0.73 mg/dL (ref 0.50–1.05)
Calcium: 9.5 mg/dL (ref 8.6–10.4)
Chloride: 104 mmol/L (ref 98–110)
Globulin: 2.4 g/dL (calc) (ref 1.9–3.7)
Glucose, Bld: 79 mg/dL (ref 65–99)
POTASSIUM: 4.1 mmol/L (ref 3.5–5.3)
Sodium: 138 mmol/L (ref 135–146)
Total Bilirubin: 0.4 mg/dL (ref 0.2–1.2)
Total Protein: 6.3 g/dL (ref 6.1–8.1)

## 2018-05-26 LAB — CBC
HEMATOCRIT: 38.9 % (ref 35.0–45.0)
HEMOGLOBIN: 13.1 g/dL (ref 11.7–15.5)
MCH: 28.2 pg (ref 27.0–33.0)
MCHC: 33.7 g/dL (ref 32.0–36.0)
MCV: 83.8 fL (ref 80.0–100.0)
MPV: 10.7 fL (ref 7.5–12.5)
Platelets: 190 10*3/uL (ref 140–400)
RBC: 4.64 10*6/uL (ref 3.80–5.10)
RDW: 11.8 % (ref 11.0–15.0)
WBC: 5.6 10*3/uL (ref 3.8–10.8)

## 2018-05-26 LAB — LIPID PANEL
Cholesterol: 176 mg/dL (ref <200)
HDL: 61 mg/dL (ref >50)
LDL Cholesterol (Calc): 100 mg/dL (calc) — ABNORMAL HIGH
NON-HDL CHOLESTEROL (CALC): 115 mg/dL (ref <130)
Total CHOL/HDL Ratio: 2.9 (calc) (ref <5.0)
Triglycerides: 66 mg/dL (ref <150)

## 2018-05-26 LAB — TSH: TSH: 2.8 mIU/L

## 2018-06-15 ENCOUNTER — Encounter: Payer: Self-pay | Admitting: Gastroenterology

## 2018-06-15 NOTE — Telephone Encounter (Signed)
Patient scheduled on 07/17/18

## 2018-06-28 ENCOUNTER — Encounter: Payer: Self-pay | Admitting: Obstetrics & Gynecology

## 2018-06-28 ENCOUNTER — Ambulatory Visit (INDEPENDENT_AMBULATORY_CARE_PROVIDER_SITE_OTHER): Payer: 59 | Admitting: Obstetrics & Gynecology

## 2018-06-28 ENCOUNTER — Ambulatory Visit (INDEPENDENT_AMBULATORY_CARE_PROVIDER_SITE_OTHER): Payer: 59

## 2018-06-28 VITALS — BP 136/78

## 2018-06-28 DIAGNOSIS — D219 Benign neoplasm of connective and other soft tissue, unspecified: Secondary | ICD-10-CM

## 2018-06-28 DIAGNOSIS — N920 Excessive and frequent menstruation with regular cycle: Secondary | ICD-10-CM | POA: Diagnosis not present

## 2018-06-28 NOTE — Progress Notes (Signed)
    Erica Hull [birth year] [id number]        52 y.o.  [id number] married.  Status post tubal ligation  RP: Menorrhagia for Pelvic US  HPI: Menstrual periods heavy on the first 3 days every month.  No breakthrough bleeding.  No pelvic pain.  No abnormal discharge.   OB History  Gravida Para Term Preterm AB Living  4 2 2   2 2   SAB TAB Ectopic Multiple Live Births          2    # Outcome Date GA Lbr Len/2nd Weight Sex Delivery Anes PTL Lv  4 AB           3 AB           2 Term     F CS-Unspec  N LIV  1 Term     M CS-Unspec  N LIV    Past medical history,surgical history, problem list, medications, allergies, family history and social history were all reviewed and documented in the EPIC chart.   Directed ROS with pertinent positives and negatives documented in the history of present illness/assessment and plan.  Exam:  Vitals:   06/28/18 0906  BP: 136/78   General appearance:  Normal  Pelvic US today: T/V images.  Anteverted uterus with several generous measuring 12.31 x 9.44 x 8.51 cm.  Endometrial lining normal at 5.5 mm.  Right ovarian follicles, largest measured at 1.8 cm.  Left ovarian involuting thick walled corpus luteum measuring 1.3 cm.  No free fluid in the posterior cul-de-sac.   Assessment/Plan:  52 y.o. [id number]   1. Menorrhagia with regular cycle Decision to observe.  Will take Ibuprofen regularly during heavy days of periods.  Hb normal at 13.1 in 05/2018.    Counseling on above issues and coordination of care more than 50% for 15 minutes.  Princess Bruins MD, 9:21 AM 06/28/2018

## 2018-06-28 NOTE — Patient Instructions (Signed)
1. Menorrhagia with regular cycle Decision to observe.  Will take Ibuprofen regularly during heavy days of periods.  Hb normal at 13.1 in 05/2018.    Erica Hull, it was a pleasure seeing you today!

## 2018-07-17 ENCOUNTER — Ambulatory Visit (AMBULATORY_SURGERY_CENTER): Payer: Self-pay

## 2018-07-17 VITALS — Ht 67.0 in | Wt 218.0 lb

## 2018-07-17 DIAGNOSIS — Z1211 Encounter for screening for malignant neoplasm of colon: Secondary | ICD-10-CM

## 2018-07-17 MED ORDER — PEG 3350-KCL-NA BICARB-NACL 420 G PO SOLR: 4000.0000 mL | Freq: Once | ORAL | 0 refills | Status: AC

## 2018-07-17 NOTE — Progress Notes (Signed)
Denies allergies to eggs or soy products. Denies complication of anesthesia or sedation. Denies use of weight loss medication. Denies use of O2.   Emmi instructions declined.  

## 2018-07-27 ENCOUNTER — Encounter: Payer: 59 | Admitting: Gastroenterology

## 2018-08-06 ENCOUNTER — Other Ambulatory Visit: Payer: Self-pay | Admitting: Obstetrics & Gynecology

## 2018-08-10 ENCOUNTER — Encounter: Payer: Self-pay | Admitting: Gastroenterology

## 2018-08-24 ENCOUNTER — Encounter: Payer: Self-pay | Admitting: Gastroenterology

## 2018-08-24 ENCOUNTER — Ambulatory Visit (AMBULATORY_SURGERY_CENTER): Payer: 59 | Admitting: Gastroenterology

## 2018-08-24 VITALS — BP 113/81 | HR 72 | Temp 98.6°F | Resp 16 | Ht 66.0 in | Wt 209.0 lb

## 2018-08-24 DIAGNOSIS — Z1211 Encounter for screening for malignant neoplasm of colon: Secondary | ICD-10-CM

## 2018-08-24 MED ORDER — SODIUM CHLORIDE 0.9 % IV SOLN: 500.0000 mL | Freq: Once | INTRAVENOUS | Status: DC

## 2018-08-24 NOTE — Patient Instructions (Signed)
YOU HAD AN ENDOSCOPIC PROCEDURE TODAY AT [facility]:   Refer to the procedure report that was given to you for any specific questions about what was found during the examination.  If the procedure report does not answer your questions, please call your gastroenterologist to clarify.  If you requested that your care partner not be given the details of your procedure findings, then the procedure report has been included in a sealed envelope for you to review at your convenience later.  YOU SHOULD EXPECT: Some feelings of bloating in the abdomen. Passage of more gas than usual.  Walking can help get rid of the air that was put into your GI tract during the procedure and reduce the bloating. If you had a lower endoscopy (such as a colonoscopy or flexible sigmoidoscopy) you may notice spotting of blood in your stool or on the toilet paper. If you underwent a bowel prep for your procedure, you may not have a normal bowel movement for a few days.  Please Note:  You might notice some irritation and congestion in your nose or some drainage.  This is from the oxygen used during your procedure.  There is no need for concern and it should clear up in a day or so.  SYMPTOMS TO REPORT IMMEDIATELY:   Following lower endoscopy (colonoscopy or flexible sigmoidoscopy):  Excessive amounts of blood in the stool  Significant tenderness or worsening of abdominal pains  Swelling of the abdomen that is new, acute  Fever of 100F or higher  For urgent or emergent issues, a gastroenterologist can be reached at any hour by calling [phone].  DIET:  We do recommend a small meal at first, but then you may proceed to your regular diet.  Drink plenty of fluids but you should avoid alcoholic beverages for 24 hours.  ACTIVITY:  You should plan to take it easy for the rest of today and you should NOT DRIVE or use heavy machinery until tomorrow (because of the sedation medicines used during the test).     FOLLOW UP: Our staff will call the number listed on your records the next business day following your procedure to check on you and address any questions or concerns that you may have regarding the information given to you following your procedure. If we do not reach you, we will leave a message.  However, if you are feeling well and you are not experiencing any problems, there is no need to return our call.  We will assume that you have returned to your regular daily activities without incident.  SIGNATURES/CONFIDENTIALITY: You and/or your care partner have signed paperwork which will be entered into your electronic medical record.  These signatures attest to the fact that that the information above on your After Visit Summary has been reviewed and is understood.  Full responsibility of the confidentiality of this discharge information lies with you and/or your care-partner.  Next colonoscopy- 10 years  Please read over handouts about high fiber diets and hemorrhoids- use fiber- I.e. Citrucel, Fibercon, Konsyl and Metamucil  Continue your normal medications

## 2018-08-24 NOTE — Progress Notes (Signed)
Report given to PACU, vss 

## 2018-08-24 NOTE — Op Note (Signed)
[name] Patient Name: Erica Hull Procedure Date: 08/24/2018 9:58 AM MRN: [id number] Endoscopist: Justice Britain , MD Age: 53 Referring MD:  Date of Birth: 08-14-1965 Gender: Female Account #: [id number] Procedure:                Colonoscopy Indications:              Screening for colorectal malignant neoplasm Medicines:                Monitored Anesthesia Care Procedure:                Pre-Anesthesia Assessment:                           - Prior to the procedure, a History and Physical                            was performed, and patient medications and                            allergies were reviewed. The patient's tolerance of                            previous anesthesia was also reviewed. The risks                            and benefits of the procedure and the sedation                            options and risks were discussed with the patient.                            All questions were answered, and informed consent                            was obtained. Prior Anticoagulants: The patient has                            taken no previous anticoagulant or antiplatelet                            agents. ASA Grade Assessment: II - A patient with                            mild systemic disease. After reviewing the risks                            and benefits, the patient was deemed in                            satisfactory condition to undergo the procedure.                           After obtaining informed consent, the colonoscope  was passed under direct vision. Throughout the                            procedure, the patient's blood pressure, pulse, and                            oxygen saturations were monitored continuously. The                            Model CF-HQ190L [id number]) scope was introduced                            through the anus and advanced to the 5 cm into the                            ileum. The  colonoscopy was performed without                            difficulty. The patient tolerated the procedure.                            The quality of the bowel preparation was good. The                            terminal ileum, ileocecal valve, appendiceal                            orifice, and rectum were photographed. Scope In: 10:09:41 AM Scope Out: 10:24:11 AM Scope Withdrawal Time: 0 hours 10 minutes 6 seconds  Total Procedure Duration: 0 hours 14 minutes 30 seconds  Findings:                 The digital rectal exam findings include                            non-thrombosed internal hemorrhoids. Pertinent                            negatives include no palpable rectal lesions.                           The terminal ileum and ileocecal valve appeared                            normal.                           Normal mucosa was found in the entire colon.                           Non-bleeding non-thrombosed internal hemorrhoids                            were found during retroflexion, during perianal  exam and during digital exam. The hemorrhoids were                            Grade II (internal hemorrhoids that prolapse but                            reduce spontaneously). Complications:            No immediate complications. Estimated Blood Loss:     Estimated blood loss: none. Impression:               - Non-thrombosed internal hemorrhoids found on                            digital rectal exam.                           - The examined portion of the ileum was normal.                           - Normal mucosa in the entire examined colon.                           - Non-bleeding non-thrombosed internal hemorrhoids. Recommendation:           - The patient will be observed post-procedure,                            until all discharge criteria are met.                           - Discharge patient to home.                           - Patient has a  contact number available for                            emergencies. The signs and symptoms of potential                            delayed complications were discussed with the                            patient. Return to normal activities tomorrow.                            Written discharge instructions were provided to the                            patient.                           - High fiber diet.                           - Use fiber, for example Citrucel, Fibercon, [employer]  or Metamucil.                           - Continue present medications.                           - Repeat colonoscopy in 10 years for screening                            purposes.                           - The findings and recommendations were discussed                            with the patient.                           - The findings and recommendations were discussed                            with the patient's family. Justice Britain, MD 08/24/2018 95:32:02 AM

## 2018-08-24 NOTE — Progress Notes (Signed)
Pt's states no medical or surgical changes since previsit or office visit. 

## 2018-08-27 ENCOUNTER — Telehealth: Payer: Self-pay

## 2018-08-27 NOTE — Telephone Encounter (Signed)
  Follow up Call-  Call back number 08/24/2018  Post procedure Call Back phone  # [phone]  Permission to leave phone message Yes  Some recent data might be hidden     Patient questions:  Do you have a fever, pain , or abdominal swelling? No. Pain Score  0 *  Have you tolerated food without any problems? Yes.    Have you been able to return to your normal activities? Yes.    Do you have any questions about your discharge instructions: Diet   No. Medications  No. Follow up visit  No.  Do you have questions or concerns about your Care? No.  Actions: * If pain score is 4 or above: No action needed, pain <4.

## 2018-10-17 ENCOUNTER — Telehealth: Payer: Self-pay | Admitting: *Deleted

## 2018-10-17 NOTE — Telephone Encounter (Signed)
Both phone notes noted - ok to see in August by me - probably will need echo.  Needs to stop caffeine in the interim.

## 2018-10-17 NOTE — Telephone Encounter (Signed)
S/w pt is not in any hurry to be seen, nothing urgent.  Pt is a daughter of Vickie Epley one of Lori's pts. Pt is aware will have to see a provider in office first as a new pt than can see Cecille Rubin.  Pt would prefer [facility]ori pick provider.  Pt is aware this will not be till august unless Cecille Rubin states pt needs to be seen sooner. Pt states has a hx of heart murmur about 20-30 years ago and was seeing someone at that time.  Pt stated had a  EKG and stated murmur was gone so pt did not have to come back. Pt also stated has been having episodes of a feeling in chest like "when you go on a roller coaster". Pt also states checks pulse and it skips a beat.

## 2018-10-17 NOTE — Telephone Encounter (Signed)
Please see prior phone note epic shut down in middle of note.  Pt has no other symptoms.  Denies, CP, SOB, dizziness, and nausea.  Pt does drink 2 cups of coffee in the am but no soda.  Pt does eat chocolate.  Stated when Cecille Rubin reviews will mychart pt to let pt know what Cecille Rubin advises.  Will send to [facility] via [employer].

## 2018-10-23 ENCOUNTER — Other Ambulatory Visit: Payer: Self-pay | Admitting: *Deleted

## 2018-10-23 MED ORDER — LEVOTHYROXINE SODIUM 88 MCG PO TABS: ORAL_TABLET | ORAL | 2 refills | Status: DC

## 2018-11-07 ENCOUNTER — Ambulatory Visit: Payer: 59 | Admitting: Nurse Practitioner

## 2019-01-25 ENCOUNTER — Ambulatory Visit (INDEPENDENT_AMBULATORY_CARE_PROVIDER_SITE_OTHER): Payer: 59

## 2019-01-25 ENCOUNTER — Other Ambulatory Visit: Payer: Self-pay

## 2019-01-25 ENCOUNTER — Ambulatory Visit (INDEPENDENT_AMBULATORY_CARE_PROVIDER_SITE_OTHER): Payer: 59 | Admitting: Podiatry

## 2019-01-25 ENCOUNTER — Encounter: Payer: Self-pay | Admitting: Podiatry

## 2019-01-25 VITALS — BP 137/84 | Temp 97.0°F

## 2019-01-25 DIAGNOSIS — M722 Plantar fascial fibromatosis: Secondary | ICD-10-CM

## 2019-01-25 DIAGNOSIS — M7752 Other enthesopathy of left foot: Secondary | ICD-10-CM

## 2019-01-25 DIAGNOSIS — M79672 Pain in left foot: Secondary | ICD-10-CM | POA: Diagnosis not present

## 2019-01-25 NOTE — Patient Instructions (Signed)
Plantar Fasciitis Rehab Ask your health care provider which exercises are safe for you. Do exercises exactly as told by your health care provider and adjust them as directed. It is normal to feel mild stretching, pulling, tightness, or discomfort as you do these exercises. Stop right away if you feel sudden pain or your pain gets worse. Do not begin these exercises until told by your health care provider. Stretching and range-of-motion exercises These exercises warm up your muscles and joints and improve the movement and flexibility of your foot. These exercises also help to relieve pain. Plantar fascia stretch  1. Sit with your left / right leg crossed over your opposite knee. 2. Hold your heel with one hand with that thumb near your arch. With your other hand, hold your toes and gently pull them back toward the top of your foot. You should feel a stretch on the bottom of your toes or your foot (plantar fascia) or both. 3. Hold this stretch for__________ seconds. 4. Slowly release your toes and return to the starting position. Repeat __________ times. Complete this exercise __________ times a day. Gastrocnemius stretch, standing This exercise is also called a calf (gastroc) stretch. It stretches the muscles in the back of the upper calf. 1. Stand with your hands against a wall. 2. Extend your left / right leg behind you, and bend your front knee slightly. 3. Keeping your heels on the floor and your back knee straight, shift your weight toward the wall. Do not arch your back. You should feel a gentle stretch in your upper left / right calf. 4. Hold this position for __________ seconds. Repeat __________ times. Complete this exercise __________ times a day. Soleus stretch, standing This exercise is also called a calf (soleus) stretch. It stretches the muscles in the back of the lower calf. 1. Stand with your hands against a wall. 2. Extend your left / right leg behind you, and bend your front  knee slightly. 3. Keeping your heels on the floor, bend your back knee and shift your weight slightly over your back leg. You should feel a gentle stretch deep in your lower calf. 4. Hold this position for __________ seconds. Repeat __________ times. Complete this exercise __________ times a day. Gastroc and soleus stretch, standing step This exercise stretches the muscles in the back of the lower leg. These muscles are in the upper calf (gastrocnemius) and the lower calf (soleus). 1. Stand with the ball of your left / right foot on a step. The ball of your foot is on the walking surface, right under your toes. 2. Keep your other foot firmly on the same step. 3. Hold on to the wall or a railing for balance. 4. Slowly lift your other foot, allowing your body weight to press your left / right heel down over the edge of the step. You should feel a stretch in your left / right calf. 5. Hold this position for __________ seconds. 6. Return both feet to the step. 7. Repeat this exercise with a slight bend in your left / right knee. Repeat __________ times with your left / right knee straight and __________ times with your left / right knee bent. Complete this exercise __________ times a day. Balance exercise This exercise builds your balance and strength control of your arch to help take pressure off your plantar fascia. Single leg stand If this exercise is too easy, you can try it with your eyes closed or while standing on a pillow. 1.   Without shoes, stand near a railing or in a doorway. You may hold on to the railing or door frame as needed. 2. Stand on your left / right foot. Keep your big toe down on the floor and try to keep your arch lifted. Do not let your foot roll inward. 3. Hold this position for __________ seconds. Repeat __________ times. Complete this exercise __________ times a day. This information is not intended to replace advice given to you by your health care provider. Make sure  you discuss any questions you have with your health care provider. Document Released: 06/27/2005 Document Revised: 10/18/2018 Document Reviewed: 04/25/2018 Elsevier Patient Education  2020 Elsevier Inc.  

## 2019-02-18 NOTE — Progress Notes (Deleted)
CARDIOLOGY OFFICE NOTE  Date:  02/18/2019    VALENE VILLA Date of Birth: 1965/11/27 Medical Record #[id number]  PCP:  Princess Bruins, MD  Cardiologist:  Berton Bon chief complaint on file.   History of Present Illness: TAHRA Erica Hull is a 53 y.o. female who presents today for a ***  She has a history of a murmur, HTN and hypothyroidism. She is the daughter of the McLamb's - her father died last year after a car accident - mother has dementia and diastolic HF.   The patient {does/does not:200015} have symptoms concerning for COVID-19 infection (fever, chills, cough, or new shortness of breath).   Comes in today. Here with   Past Medical History:  Diagnosis Date  . Elective abortion    X2  . H/O peripheral vascular disease 2006  . Heart murmur   . Hx of cystitis 2006  . Hypertension    pregnancy induced  . Hypothyroidism   . Missed ab     Past Surgical History:  Procedure Laterality Date  . CESAREAN SECTION    . CESAREAN SECTION W/BTL    . DILATION AND CURETTAGE OF UTERUS    . ENDOMETRIAL ABLATION     HTA  . ENDOSCOPIC VEIN LASER TREATMENT    . vein closure procedure Left 2006     Medications: No outpatient medications have been marked as taking for the 02/20/19 encounter (Appointment) with Burtis Junes, NP.     Allergies: No Known Allergies  Social History: The patient  reports that she has never smoked. She has never used smokeless tobacco. She reports current alcohol use. She reports that she does not use drugs.   Family History: The patient's ***family history includes Atrial fibrillation in her father; COPD in her mother; Cancer (age of onset: 75) in her father; Colon polyps in her father; Dementia in her mother; Hypertension in her father; Rheum arthritis in her mother.   Review of Systems: Please see the history of present illness.   All other systems are reviewed and negative.   Physical Exam: VS:  There were no vitals  taken for this visit. Marland Kitchen  BMI There is no height or weight on file to calculate BMI.  Wt Readings from Last 3 Encounters:  08/24/18 209 lb (94.8 kg)  07/17/18 218 lb (98.9 kg)  05/24/18 209 lb (94.8 kg)    General: Pleasant. Well developed, well nourished and in no acute distress.   HEENT: Normal.  Neck: Supple, no JVD, carotid bruits, or masses noted.  Cardiac: ***Regular rate and rhythm. No murmurs, rubs, or gallops. No edema.  Respiratory:  Lungs are clear to auscultation bilaterally with normal work of breathing.  GI: Soft and nontender.  MS: No deformity or atrophy. Gait and ROM intact.  Skin: Warm and dry. Color is normal.  Neuro:  Strength and sensation are intact and no gross focal deficits noted.  Psych: Alert, appropriate and with normal affect.   LABORATORY DATA:  EKG:  EKG {ACTION; IS/IS QBH:41937902} ordered today. This demonstrates ***.  Lab Results  Component Value Date   WBC 5.6 05/25/2018   HGB 13.1 05/25/2018   HCT 38.9 05/25/2018   PLT 190 05/25/2018   GLUCOSE 79 05/25/2018   CHOL 176 05/25/2018   TRIG 66 05/25/2018   HDL 61 05/25/2018   LDLCALC 100 (H) 05/25/2018   ALT 11 05/25/2018   AST 16 05/25/2018   NA 138 05/25/2018   K 4.1 05/25/2018  CL 104 05/25/2018   CREATININE 0.73 05/25/2018   BUN 12 05/25/2018   CO2 26 05/25/2018   TSH 2.80 05/25/2018     BNP (last 3 results) No results for input(s): BNP in the last 8760 hours.  ProBNP (last 3 results) No results for input(s): PROBNP in the last 8760 hours.   Other Studies Reviewed Today:   Assessment/Plan: 1.   2. Murmur  3.   4. COVID-19 Education: The signs and symptoms of COVID-19 were discussed with the patient and how to seek care for testing (follow up with PCP or arrange E-visit).  The importance of social distancing, staying at home, hand hygiene and wearing a mask when out in public were discussed today.  Current medicines are reviewed with the patient today.  The patient  does not have concerns regarding medicines other than what has been noted above.  The following changes have been made:  See above.  Labs/ tests ordered today include:   No orders of the defined types were placed in this encounter.    Disposition:   FU with *** in {gen number 1-93:790240} {Days to years:10300}.   Patient is agreeable to this plan and will call if any problems develop in the interim.   SignedTruitt Merle, NP  02/18/2019 7:38 AM  [facility] [address] [place], [place]  [address] Phone: [phone] Fax: [phone]

## 2019-02-20 ENCOUNTER — Ambulatory Visit: Payer: 59 | Admitting: Nurse Practitioner

## 2019-02-21 ENCOUNTER — Encounter: Payer: Self-pay | Admitting: Podiatry

## 2019-02-21 ENCOUNTER — Ambulatory Visit (INDEPENDENT_AMBULATORY_CARE_PROVIDER_SITE_OTHER): Payer: 59 | Admitting: Podiatry

## 2019-02-21 ENCOUNTER — Other Ambulatory Visit: Payer: Self-pay

## 2019-02-21 VITALS — Temp 97.1°F

## 2019-02-21 DIAGNOSIS — M722 Plantar fascial fibromatosis: Secondary | ICD-10-CM | POA: Diagnosis not present

## 2019-02-21 NOTE — Progress Notes (Signed)
  Subjective:  Patient ID: Erica Hull, female    DOB: 07-22-1965,  MRN: [id number]  Chief Complaint  Patient presents with  . Plantar Fasciitis    L heel; "pain never really went away; pain is all over heel (bottom and both sides); taking aleve for pain but only dulls down a little; throbs off and on even when sitting"    53 y.o. female presents with the above complaint. Hx as abov3.   Review of Systems: Negative except as noted in the HPI. Denies N/V/F/Ch.  Past Medical History:  Diagnosis Date  . Elective abortion    X2  . H/O peripheral vascular disease 2006  . Heart murmur   . Hx of cystitis 2006  . Hypertension    pregnancy induced  . Hypothyroidism   . Missed ab     Current Outpatient Medications:  .  Cyanocobalamin (VITAMIN B 12 PO), Take 1 tablet by mouth daily., Disp: , Rfl:  .  Ginkgo Biloba 40 MG TABS, Take 1 tablet by mouth daily., Disp: , Rfl:  .  levothyroxine (SYNTHROID, LEVOTHROID) 88 MCG tablet, TAKE 1 TABLET BY MOUTH DAILY BEFORE BREAKFAST., Disp: 90 tablet, Rfl: 2 .  Multiple Vitamins-Minerals (MULTIVITAMIN WITH MINERALS) tablet, Take 1 tablet by mouth daily., Disp: , Rfl:  .  Omega-3 Fatty Acids (FISH OIL) 1200 MG CAPS, Take 1 capsule by mouth daily., Disp: , Rfl:  .  vitamin E 400 UNIT capsule, Take 400 Units by mouth daily., Disp: , Rfl:   Social History   Tobacco Use  Smoking Status Never Smoker  Smokeless Tobacco Never Used    No Known Allergies Objective:   Vitals:   02/21/19 1558  Temp: (!) 97.1 F (36.2 C)   There is no height or weight on file to calculate BMI. Constitutional Well developed. Well nourished.  Vascular Dorsalis pedis pulses palpable bilaterally. Posterior tibial pulses palpable bilaterally. Capillary refill normal to all digits.  No cyanosis or clubbing noted. Pedal hair growth normal.  Neurologic Normal speech. Oriented to person, place, and time. Epicritic sensation to light touch grossly present  bilaterally.  Dermatologic Nails well groomed and normal in appearance. No open wounds. No skin lesions.  Orthopedic: Normal joint ROM without pain or crepitus bilaterally. No visible deformities. Tender to palpation at the calcaneal tuber left. No pain with calcaneal squeeze left. Ankle ROM diminished range of motion left. Silfverskiold Test: positive left.    Assessment:   1. Plantar fasciitis of left foot    Plan:  Patient was evaluated and treated and all questions answered.  Plantar Fasciitis, left -Continue stretching and icing -Injection #2 as below -Dispense PF brace  Procedure: Injection Tendon/Ligament Location: Left plantar fascia at the glabrous junction; medial approach. Skin Prep: alcohol Injectate: 1 cc 0.5% marcaine plain, 1 cc dexamethasone phosphate, 0.5 cc kenalog 40. Disposition: Patient tolerated procedure well. Injection site dressed with a band-aid.  Return in about 3 weeks (around 03/14/2019) for Plantar fasciitis, Left.

## 2019-02-21 NOTE — Patient Instructions (Signed)
Plantar Fasciitis Rehab Ask your health care provider which exercises are safe for you. Do exercises exactly as told by your health care provider and adjust them as directed. It is normal to feel mild stretching, pulling, tightness, or discomfort as you do these exercises. Stop right away if you feel sudden pain or your pain gets worse. Do not begin these exercises until told by your health care provider. Stretching and range-of-motion exercises These exercises warm up your muscles and joints and improve the movement and flexibility of your foot. These exercises also help to relieve pain. Plantar fascia stretch  1. Sit with your left / right leg crossed over your opposite knee. 2. Hold your heel with one hand with that thumb near your arch. With your other hand, hold your toes and gently pull them back toward the top of your foot. You should feel a stretch on the bottom of your toes or your foot (plantar fascia) or both. 3. Hold this stretch for__________ seconds. 4. Slowly release your toes and return to the starting position. Repeat __________ times. Complete this exercise __________ times a day. Gastrocnemius stretch, standing This exercise is also called a calf (gastroc) stretch. It stretches the muscles in the back of the upper calf. 1. Stand with your hands against a wall. 2. Extend your left / right leg behind you, and bend your front knee slightly. 3. Keeping your heels on the floor and your back knee straight, shift your weight toward the wall. Do not arch your back. You should feel a gentle stretch in your upper left / right calf. 4. Hold this position for __________ seconds. Repeat __________ times. Complete this exercise __________ times a day. Soleus stretch, standing This exercise is also called a calf (soleus) stretch. It stretches the muscles in the back of the lower calf. 1. Stand with your hands against a wall. 2. Extend your left / right leg behind you, and bend your front  knee slightly. 3. Keeping your heels on the floor, bend your back knee and shift your weight slightly over your back leg. You should feel a gentle stretch deep in your lower calf. 4. Hold this position for __________ seconds. Repeat __________ times. Complete this exercise __________ times a day. Gastroc and soleus stretch, standing step This exercise stretches the muscles in the back of the lower leg. These muscles are in the upper calf (gastrocnemius) and the lower calf (soleus). 1. Stand with the ball of your left / right foot on a step. The ball of your foot is on the walking surface, right under your toes. 2. Keep your other foot firmly on the same step. 3. Hold on to the wall or a railing for balance. 4. Slowly lift your other foot, allowing your body weight to press your left / right heel down over the edge of the step. You should feel a stretch in your left / right calf. 5. Hold this position for __________ seconds. 6. Return both feet to the step. 7. Repeat this exercise with a slight bend in your left / right knee. Repeat __________ times with your left / right knee straight and __________ times with your left / right knee bent. Complete this exercise __________ times a day. Balance exercise This exercise builds your balance and strength control of your arch to help take pressure off your plantar fascia. Single leg stand If this exercise is too easy, you can try it with your eyes closed or while standing on a pillow. 1.   Without shoes, stand near a railing or in a doorway. You may hold on to the railing or door frame as needed. 2. Stand on your left / right foot. Keep your big toe down on the floor and try to keep your arch lifted. Do not let your foot roll inward. 3. Hold this position for __________ seconds. Repeat __________ times. Complete this exercise __________ times a day. This information is not intended to replace advice given to you by your health care provider. Make sure  you discuss any questions you have with your health care provider. Document Released: 06/27/2005 Document Revised: 10/18/2018 Document Reviewed: 04/25/2018 Elsevier Patient Education  2020 Elsevier Inc.  

## 2019-02-21 NOTE — Progress Notes (Signed)
  Subjective:  Patient ID: Erica Hull, female    DOB: 1965/10/16,  MRN: [id number]  Chief Complaint  Patient presents with  . Plantar Fasciitis    left foot pain, located on the bottom of the heel, pt states that the pain has been going on for about 1 month, pt has tried toe exercies, as well as a toe stretcher    53 y.o. female presents with the above complaint. Hx as above.   Review of Systems: Negative except as noted in the HPI. Denies N/V/F/Ch.  Past Medical History:  Diagnosis Date  . Elective abortion    X2  . H/O peripheral vascular disease 2006  . Heart murmur   . Hx of cystitis 2006  . Hypertension    pregnancy induced  . Hypothyroidism   . Missed ab     Current Outpatient Medications:  .  Cyanocobalamin (VITAMIN B 12 PO), Take 1 tablet by mouth daily., Disp: , Rfl:  .  Ginkgo Biloba 40 MG TABS, Take 1 tablet by mouth daily., Disp: , Rfl:  .  levothyroxine (SYNTHROID, LEVOTHROID) 88 MCG tablet, TAKE 1 TABLET BY MOUTH DAILY BEFORE BREAKFAST., Disp: 90 tablet, Rfl: 2 .  Multiple Vitamins-Minerals (MULTIVITAMIN WITH MINERALS) tablet, Take 1 tablet by mouth daily., Disp: , Rfl:  .  Omega-3 Fatty Acids (FISH OIL) 1200 MG CAPS, Take 1 capsule by mouth daily., Disp: , Rfl:  .  vitamin E 400 UNIT capsule, Take 400 Units by mouth daily., Disp: , Rfl:   Social History   Tobacco Use  Smoking Status Never Smoker  Smokeless Tobacco Never Used    No Known Allergies Objective:   Vitals:   01/25/19 1353  BP: 137/84  Temp: (!) 97 F (36.1 C)   There is no height or weight on file to calculate BMI. Constitutional Well developed. Well nourished.  Vascular Dorsalis pedis pulses palpable bilaterally. Posterior tibial pulses palpable bilaterally. Capillary refill normal to all digits.  No cyanosis or clubbing noted. Pedal hair growth normal.  Neurologic Normal speech. Oriented to person, place, and time. Epicritic sensation to light touch grossly present  bilaterally.  Dermatologic Nails well groomed and normal in appearance. No open wounds. No skin lesions.  Orthopedic: Normal joint ROM without pain or crepitus bilaterally. No visible deformities. Tender to palpation at the calcaneal tuber left. No pain with calcaneal squeeze left. Ankle ROM diminished range of motion left. Silfverskiold Test: positive left.   Radiographs: Taken and reviewed. No acute fractures or dislocations. No evidence of stress fracture.   Assessment:   1. Plantar fasciitis of left foot   2. Pain of left heel   3. Bursitis of heel, left    Plan:  Patient was evaluated and treated and all questions answered.  Plantar Fasciitis, left - XR reviewed as above.  - Educated on icing and stretching. Instructions given.  - Injection delivered to the plantar fascia as below. - DME: Night splint dispensed.  Procedure: Injection Tendon/Ligament Location: Left plantar fascia at the glabrous junction; medial approach. Skin Prep: alcohol Injectate: 1 cc 0.5% marcaine plain, 1 cc dexamethasone phosphate, 0.5 cc kenalog 10. Disposition: Patient tolerated procedure well. Injection site dressed with a band-aid.  Return in about 3 weeks (around 02/15/2019) for Plantar fasciitis, Left.

## 2019-03-07 ENCOUNTER — Telehealth: Payer: Self-pay | Admitting: *Deleted

## 2019-03-07 NOTE — Telephone Encounter (Signed)
S/w pt went over all instructions for upcoming telehealth appt on Sept 1.     Virtual Visit Pre-Appointment Phone Call  "(Name), I am calling you today to discuss your upcoming appointment. We are currently trying to limit exposure to the virus that causes COVID-19 by seeing patients at home rather than in the office."  1. "What is the BEST phone number to call the day of the visit?" - include this in appointment notes  2. "Do you have or have access to (through a family member/friend) a smartphone with video capability that we can use for your visit?" a. If yes - list this number in appt notes as "cell" (if different from BEST phone #) and list the appointment type as a VIDEO visit in appointment notes b. If no - list the appointment type as a PHONE visit in appointment notes  3. Confirm consent - "In the setting of the current Covid19 crisis, you are scheduled for a (phone or video) visit with your provider on (Tuesday, Sept 1) at (2:00 pm).  Just as we do with many in-office visits, in order for you to participate in this visit, we must obtain consent.  If you'd like, I can send this to your mychart (if signed up) or email for you to review.  Otherwise, I can obtain your verbal consent now.  All virtual visits are billed to your insurance company just like a normal visit would be.  By agreeing to a virtual visit, we'd like you to understand that the technology does not allow for your provider to perform an examination, and thus may limit your provider's ability to fully assess your condition. If your provider identifies any concerns that need to be evaluated in person, we will make arrangements to do so.  Finally, though the technology is pretty good, we cannot assure that it will always work on either your or our end, and in the setting of a video visit, we may have to convert it to a phone-only visit.  In either situation, we cannot ensure that we have a secure connection.  Are you willing to  proceed?" STAFF: Did the patient verbally acknowledge consent to telehealth visit? Document YES/NO here: YES  4. Advise patient to be prepared - "Two hours prior to your appointment, go ahead and check your blood pressure, pulse, oxygen saturation, and your weight (if you have the equipment to check those) and write them all down. When your visit starts, your provider will ask you for this information. If you have an Apple Watch or Kardia device, please plan to have heart rate information ready on the day of your appointment. Please have a pen and paper handy nearby the day of the visit as well."  5. Give patient instructions for MyChart download to smartphone OR Doximity/Doxy.me as below if video visit (depending on what platform provider is using)  6. Inform patient they will receive a phone call 15 minutes prior to their appointment time (may be from unknown caller ID) so they should be prepared to answer    TELEPHONE CALL NOTE  Erica Hull has been deemed a candidate for a follow-up tele-health visit to limit community exposure during the Covid-19 pandemic. I spoke with the patient via phone to ensure availability of phone/video source, confirm preferred email & phone number, and discuss instructions and expectations.  I reminded Erica Hull to be prepared with any vital sign and/or heart rhythm information that could potentially be obtained via home monitoring,  at the time of her visit. I reminded Erica Hull to expect a phone call prior to her visit.  Erica Hull 03/07/2019 11:21 AM   INSTRUCTIONS FOR DOWNLOADING THE MYCHART APP TO SMARTPHONE  - The patient must first make sure to have activated MyChart and know their login information - If Apple, go to [employer] and type in MyChart in the search bar and download the app. If Android, ask patient to go to [employer] and type in [facility] in the search bar and download the app. The app is free but as with any  other app downloads, their phone may require them to verify saved payment information or Apple/Android password.  - The patient will need to then log into the app with their MyChart username and password, and select [facility] as their healthcare provider to link the account. When it is time for your visit, go to the MyChart app, find appointments, and click Begin Video Visit. Be sure to Select Allow for your device to access the Microphone and Camera for your visit. You will then be connected, and your provider will be with you shortly.  **If they have any issues connecting, or need assistance please contact MyChart service desk [phone])**  **If using a computer, in order to ensure the best quality for their visit they will need to use either of the following Internet Browsers: [employer], or Google Chrome**  IF USING DOXIMITY or DOXY.ME - The patient will receive a link just prior to their visit by text.     FULL LENGTH CONSENT FOR TELE-HEALTH VISIT   I hereby voluntarily request, consent and authorize [facility] and its employed or contracted physicians, physician assistants, nurse practitioners or other licensed health care professionals (the Practitioner), to provide me with telemedicine health care services (the "Services") as deemed necessary by the treating Practitioner. I acknowledge and consent to receive the Services by the Practitioner via telemedicine. I understand that the telemedicine visit will involve communicating with the Practitioner through live audiovisual communication technology and the disclosure of certain medical information by electronic transmission. I acknowledge that I have been given the opportunity to request an in-person assessment or other available alternative prior to the telemedicine visit and am voluntarily participating in the telemedicine visit.  I understand that I have the right to withhold or withdraw my consent to the use of  telemedicine in the course of my care at any time, without affecting my right to future care or treatment, and that the Practitioner or I may terminate the telemedicine visit at any time. I understand that I have the right to inspect all information obtained and/or recorded in the course of the telemedicine visit and may receive copies of available information for a reasonable fee.  I understand that some of the potential risks of receiving the Services via telemedicine include:  Marland Kitchen Delay or interruption in medical evaluation due to technological equipment failure or disruption; . Information transmitted may not be sufficient (e.g. poor resolution of images) to allow for appropriate medical decision making by the Practitioner; and/or  . In rare instances, security protocols could fail, causing a breach of personal health information.  Furthermore, I acknowledge that it is my responsibility to provide information about my medical history, conditions and care that is complete and accurate to the best of my ability. I acknowledge that Practitioner's advice, recommendations, and/or decision may be based on factors not within their control, such as incomplete or inaccurate  data provided by me or distortions of diagnostic images or specimens that may result from electronic transmissions. I understand that the practice of medicine is not an exact science and that Practitioner makes no warranties or guarantees regarding treatment outcomes. I acknowledge that I will receive a copy of this consent concurrently upon execution via email to the email address I last provided but may also request a printed copy by calling the office of [facility].    I understand that my insurance will be billed for this visit.   I have read or had this consent read to me. . I understand the contents of this consent, which adequately explains the benefits and risks of the Services being provided via telemedicine.  . I have been  provided ample opportunity to ask questions regarding this consent and the Services and have had my questions answered to my satisfaction. . I give my informed consent for the services to be provided through the use of telemedicine in my medical care  By participating in this telemedicine visit I agree to the above.

## 2019-03-08 NOTE — Progress Notes (Signed)
Telehealth Visit     Virtual Visit via Video Note   This visit type was conducted due to national recommendations for restrictions regarding the COVID-19 Pandemic (e.g. social distancing) in an effort to limit this patient's exposure and mitigate transmission in our community.  Due to her co-morbid illnesses, this patient is at least at moderate risk for complications without adequate follow up.  This format is felt to be most appropriate for this patient at this time.  All issues noted in this document were discussed and addressed.  A limited physical exam was performed with this format.  Please refer to the patient's chart for her consent to telehealth for [facility].   Evaluation Performed:  Follow-up visit  This visit type was conducted due to national recommendations for restrictions regarding the COVID-19 Pandemic (e.g. social distancing).  This format is felt to be most appropriate for this patient at this time.  All issues noted in this document were discussed and addressed.  No physical exam was performed (except for noted visual exam findings with Video Visits).  Please refer to the patient's chart (MyChart message for video visits and phone note for telephone visits) for the patient's consent to telehealth for [facility].  Date:  03/12/2019   ID:  Erica Hull, DOB [birth year], MRN [id number]  Patient Location:  Home  Provider location:   Home  PCP:  Princess Bruins, MD  Cardiologist:  Servando Snare & No primary care provider on file.  Electrophysiologist:  None   Chief Complaint:  New patient visit for murmur/palpitations  History of Present Illness:    Erica Hull is a 53 y.o. female who presents via Engineer, civil (consulting) for a telehealth visit today.  Seen as a new patient - will follow with me. I took care of her father (he had multiple health issues and ended up dying following a bad car wreck) and care for her mom as well.   The patient does not have  symptoms concerning for COVID-19 infection (fever, chills, cough, or new shortness of breath).   Seen today via Doximity video. She has consented for this visit. She is working from home for [employer]. 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 Garyn thinks she is fine but wanting to be proactive in regards to her health. Apparently was told in the past that she was told she had a murmur - was on SBE but then told she did not have a murmur and no longer uses SBE. Also with palpitations - that does not make her feel bad. Always feels at rest - sitting or lying in bed - feels butterfles/quivering in her chest. She has found skipped beats when checked and then "my heart stops". This will happen about 3 to 4 times a week. She does not use much caffeine - about 2 cups of coffee a day. Some wine. No soda. Not as active. No chest pain. Not short of breath. No passing out spells. Not dizzy. BP is fine.   Past Medical History:  Diagnosis Date  . Elective abortion    X2  . H/O peripheral vascular disease 2006  . Heart murmur   . Hx of cystitis 2006  . Hypertension    pregnancy induced  . Hypothyroidism   . Missed ab    Past Surgical History:  Procedure Laterality Date  . CESAREAN SECTION    . CESAREAN SECTION W/BTL    .  DILATION AND CURETTAGE OF UTERUS    . ENDOMETRIAL ABLATION     HTA  . ENDOSCOPIC VEIN LASER TREATMENT    . vein closure procedure Left 2006     Current Meds  Medication Sig  . Boswellia-Glucosamine-Vit D (OSTEO BI-FLEX ONE PER DAY PO) Take 1 tablet by mouth daily.  . Cyanocobalamin (VITAMIN B 12 PO) Take 1 tablet by mouth daily.  . Ginkgo Biloba 40 MG TABS Take 1 tablet by mouth daily.  Marland Kitchen levothyroxine (SYNTHROID, LEVOTHROID) 88 MCG tablet TAKE 1 TABLET BY MOUTH DAILY BEFORE BREAKFAST.  . Multiple Vitamins-Minerals (MULTIVITAMIN WITH MINERALS) tablet Take 1 tablet by mouth daily.  . Omega-3 Fatty Acids (FISH OIL) 1200 MG  CAPS Take 1 capsule by mouth daily.  Marland Kitchen omeprazole (PRILOSEC) 20 MG capsule Take 20 mg by mouth as needed (indigestion).  . vitamin E 400 UNIT capsule Take 400 Units by mouth daily.     Allergies:   Patient has no known allergies.   Social History   Tobacco Use  . Smoking status: Never Smoker  . Smokeless tobacco: Never Used  Substance Use Topics  . Alcohol use: Yes    Alcohol/week: 0.0 standard drinks    Comment: rare  . Drug use: No     Family Hx: The patient's family history includes Atrial fibrillation in her father; COPD in her mother; Cancer (age of onset: 67) in her father; Colon polyps in her father; Dementia in her mother; Hypertension in her father; Rheum arthritis in her mother. There is no history of Colon cancer, Esophageal cancer, Rectal cancer, or Stomach cancer.  ROS:   Please see the history of present illness.   All other systems reviewed are negative.    Objective:    Vital Signs:  BP 125/86   Pulse 64   Ht 5\' 7"  (1.702 m)   Wt 215 lb (97.5 kg)   BMI 33.67 kg/m    Wt Readings from Last 3 Encounters:  03/12/19 215 lb (97.5 kg)  08/24/18 209 lb (94.8 kg)  07/17/18 218 lb (98.9 kg)    Alert female in no acute distress. She is not short of breath with conversation.    Labs/Other Tests and Data Reviewed:    Lab Results  Component Value Date   WBC 5.6 05/25/2018   HGB 13.1 05/25/2018   HCT 38.9 05/25/2018   PLT 190 05/25/2018   GLUCOSE 79 05/25/2018   CHOL 176 05/25/2018   TRIG 66 05/25/2018   HDL 61 05/25/2018   LDLCALC 100 (H) 05/25/2018   ALT 11 05/25/2018   AST 16 05/25/2018   NA 138 05/25/2018   K 4.1 05/25/2018   CL 104 05/25/2018   CREATININE 0.73 05/25/2018   BUN 12 05/25/2018   CO2 26 05/25/2018   TSH 2.80 05/25/2018     BNP (last 3 results) No results for input(s): BNP in the last 8760 hours.  ProBNP (last 3 results) No results for input(s): PROBNP in the last 8760 hours.    Prior CV studies:    The following studies  were reviewed today:  N/A   ASSESSMENT & PLAN:    1.  History of reported murmur - will get echo to further evaluate.   2. Palpitations - sounds like may be PVC's - father had AF and was on anticoagulation. She is agreeable to getting an echo and a 14 day monitor. Her labs from November are noted and were ok.   3. Caregiver role/stress - she wants  to be proactive with her health so that she can continue to provide for her mother.   4. COVID-19 Education: The signs and symptoms of COVID-19 were discussed with the patient and how to seek care for testing (follow up with PCP or arrange E-visit).  The importance of social distancing, staying at home, hand hygiene and wearing a mask when out in public were discussed today.  Patient Risk:   After full review of this patient's clinical status, I feel that they are at least moderate risk at this time.  Time:   Today, I have spent 16 minutes with the patient with telehealth technology discussing the above issues.     Medication Adjustments/Labs and Tests Ordered: Current medicines are reviewed at length with the patient today.  Concerns regarding medicines are outlined above.   Tests Ordered: No orders of the defined types were placed in this encounter.   Medication Changes: No orders of the defined types were placed in this encounter.   Disposition:  Further disposition pending based on tests' results.   Patient is agreeable to this plan and will call if any problems develop in the interim.   Amie Critchley, NP  03/12/2019 2:25 PM    [facility]

## 2019-03-12 ENCOUNTER — Telehealth: Payer: Self-pay | Admitting: *Deleted

## 2019-03-12 ENCOUNTER — Encounter: Payer: Self-pay | Admitting: Nurse Practitioner

## 2019-03-12 ENCOUNTER — Other Ambulatory Visit: Payer: Self-pay

## 2019-03-12 ENCOUNTER — Telehealth (INDEPENDENT_AMBULATORY_CARE_PROVIDER_SITE_OTHER): Payer: 59 | Admitting: Nurse Practitioner

## 2019-03-12 VITALS — BP 125/86 | HR 64 | Ht 67.0 in | Wt 215.0 lb

## 2019-03-12 DIAGNOSIS — R002 Palpitations: Secondary | ICD-10-CM

## 2019-03-12 DIAGNOSIS — R011 Cardiac murmur, unspecified: Secondary | ICD-10-CM | POA: Diagnosis not present

## 2019-03-12 NOTE — Patient Instructions (Addendum)
After Visit Summary:  We will be checking the following labs today - NONE  Medication Instructions:    Continue with your current medicines.    If you need a refill on your cardiac medications before your next appointment, please call your pharmacy.     Testing/Procedures To Be Arranged:  N/A  Follow-Up:   See     At [facility], you and your health needs are our priority.  As part of our continuing mission to provide you with exceptional heart care, we have created designated Provider Care Teams.  These Care Teams include your primary Cardiologist (physician) and Advanced Practice Providers (APPs -  Physician Assistants and Nurse Practitioners) who all work together to provide you with the care you need, when you need it.  Special Instructions:  . Stay safe, stay home, wash your hands for at least 20 seconds and wear a mask when out in public.  . It was good to talk with you today.    Call the [facility] office at [phone] if you have any questions, problems or concerns.       

## 2019-03-12 NOTE — Telephone Encounter (Signed)
S/w pt made echo appt and sent 14 day zio and echo to precert.  Sent message to Markus Daft, monitor tech to call pt to set up monitor.  Explained billing process to pt.

## 2019-03-13 ENCOUNTER — Encounter: Payer: Self-pay | Admitting: Obstetrics & Gynecology

## 2019-03-13 ENCOUNTER — Telehealth: Payer: Self-pay | Admitting: Radiology

## 2019-03-13 NOTE — Telephone Encounter (Signed)
Enrolled patient for a 14 Day Zio monitor to be mailed. Brief instructions were gone over with the patient and she knows to expect the monitor to arrive in 3-4 days 

## 2019-03-14 ENCOUNTER — Ambulatory Visit: Payer: 59 | Admitting: Podiatry

## 2019-03-19 ENCOUNTER — Ambulatory Visit (INDEPENDENT_AMBULATORY_CARE_PROVIDER_SITE_OTHER): Payer: 59

## 2019-03-19 DIAGNOSIS — R011 Cardiac murmur, unspecified: Secondary | ICD-10-CM | POA: Diagnosis not present

## 2019-03-19 DIAGNOSIS — R002 Palpitations: Secondary | ICD-10-CM | POA: Diagnosis not present

## 2019-03-22 ENCOUNTER — Ambulatory Visit (HOSPITAL_COMMUNITY): Payer: 59 | Attending: Cardiology

## 2019-03-22 ENCOUNTER — Other Ambulatory Visit: Payer: Self-pay

## 2019-03-22 DIAGNOSIS — R011 Cardiac murmur, unspecified: Secondary | ICD-10-CM | POA: Insufficient documentation

## 2019-03-22 DIAGNOSIS — R002 Palpitations: Secondary | ICD-10-CM | POA: Insufficient documentation

## 2019-03-26 ENCOUNTER — Other Ambulatory Visit: Payer: Self-pay | Admitting: Nurse Practitioner

## 2019-03-26 DIAGNOSIS — I7789 Other specified disorders of arteries and arterioles: Secondary | ICD-10-CM

## 2019-03-26 DIAGNOSIS — I712 Thoracic aortic aneurysm, without rupture, unspecified: Secondary | ICD-10-CM

## 2019-04-15 ENCOUNTER — Telehealth: Payer: Self-pay | Admitting: *Deleted

## 2019-04-15 ENCOUNTER — Other Ambulatory Visit: Payer: Self-pay | Admitting: *Deleted

## 2019-04-15 ENCOUNTER — Other Ambulatory Visit: Payer: Self-pay

## 2019-04-15 MED ORDER — METOPROLOL SUCCINATE ER 25 MG PO TB24: 25.0000 mg | ORAL_TABLET | Freq: Every day | ORAL | 2 refills | Status: DC

## 2019-04-15 NOTE — Telephone Encounter (Signed)
° ° °  Please return call to patient °

## 2019-04-15 NOTE — Telephone Encounter (Signed)
S/w pt and was given the preliminary results for Zio monitor.  Overall sinus rhythm, no afib, did have SVT did not correlate with pt pressing the button with symptoms. Added Toprol XL one tablet (25 mg ) daily, sent in today to local and mail order pharmacy. Scheduled pt f/u in 3 months with Truitt Merle, NP, will hold on to preliminary monitor results till final results are in.

## 2019-04-15 NOTE — Telephone Encounter (Signed)
lvm for pt to call office for preliminary results of monitor.

## 2019-04-23 ENCOUNTER — Other Ambulatory Visit: Payer: Self-pay

## 2019-04-23 ENCOUNTER — Ambulatory Visit (INDEPENDENT_AMBULATORY_CARE_PROVIDER_SITE_OTHER)
Admission: RE | Admit: 2019-04-23 | Discharge: 2019-04-23 | Disposition: A | Discharge status: Home / Self Care | Payer: 59 | Source: Ambulatory Visit | Attending: Nurse Practitioner | Admitting: Nurse Practitioner

## 2019-04-23 DIAGNOSIS — I712 Thoracic aortic aneurysm, without rupture, unspecified: Secondary | ICD-10-CM

## 2019-04-23 MED ORDER — IOHEXOL 350 MG/ML SOLN
100.0000 mL | Freq: Once | INTRAVENOUS | Status: AC | PRN
  Administered 2019-04-23: 15:00:00 100 mL via INTRAVENOUS

## 2019-05-08 NOTE — Telephone Encounter (Signed)
S/w pt stated ankles have never been that great with circulation, does have more swelling than usual. Stated Toprol does not typically cause swelling.  Discussed food and salt intake.  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  Two nights ago pt had tightness with inhalation. Last night pt did not sleep well and had chest tightness several hours Pt denies SOB,dizziness, and nausea.  Stated has been having lots of concerns with mother. Advised with Cecille Rubin pt will come in next week for ov and EKG. Will send to [facility].

## 2019-05-09 NOTE — Progress Notes (Signed)
CARDIOLOGY OFFICE NOTE  Date:  05/14/2019    Erica Hull Date of Birth: 26-Sep-1965 Medical Record [id number]  PCP:  Princess Bruins, MD  Cardiologist:  Servando Snare    Chief Complaint  Patient presents with  . Chest Pain    Work in visit     History of Present Illness: Erica Hull is a 53 y.o. female who presents today for a work in visit. She follows with me.  I took care of her father (he had multiple health issues and ended up dying following a bad car wreck) and care for her mom as well.   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 She had been told she had a heart murmur. She had palpitations. We got a monitor - she had 2 runs of NSVT - beta blocker was started. An echo showed a dilated aorta - we did a CT - her aorta is more prominent for her age. Has normal EF by echo.  The patient does not have symptoms concerning for COVID-19 infection (fever, chills, cough, or new shortness of breath).   Comes in today. Here alone. She has had more in the way of swelling in her feet - may have been using extra NSAID due to plantar fascitis. Has had prior vein work on her legs in the past. She has used support stockings in the past. Not using that much salt. Lots of stress with her mom. She has 2 brothers - one is here and one in [place] - the one here helps some. Not really able to make time for herself and is worried about what would happen if her own health fails. She had one incident of chest tightness - this was at rest - very short lived - she has had no exertional symptoms and it has not returned. The beta blocker has helped with her palpitations.    Past Medical History:  Diagnosis Date  . Elective abortion    X2  . H/O peripheral vascular disease 2006  . Heart murmur   . Hx of cystitis 2006  . Hypertension    pregnancy induced  . Hypothyroidism   . Missed ab     Past Surgical History:   Procedure Laterality Date  . CESAREAN SECTION    . CESAREAN SECTION W/BTL    . DILATION AND CURETTAGE OF UTERUS    . ENDOMETRIAL ABLATION     HTA  . ENDOSCOPIC VEIN LASER TREATMENT    . vein closure procedure Left 2006     Medications: Current Meds  Medication Sig  . Boswellia-Glucosamine-Vit D (OSTEO BI-FLEX ONE PER DAY PO) Take 1 tablet by mouth daily.  . Cyanocobalamin (VITAMIN B 12 PO) Take 1 tablet by mouth daily.  . Ginkgo Biloba 40 MG TABS Take 1 tablet by mouth daily.  Marland Kitchen levothyroxine (SYNTHROID, LEVOTHROID) 88 MCG tablet TAKE 1 TABLET BY MOUTH DAILY BEFORE BREAKFAST.  . metoprolol succinate (TOPROL XL) 25 MG 24 hr tablet Take 1 tablet (25 mg total) by mouth daily.  . Multiple Vitamins-Minerals (MULTIVITAMIN WITH MINERALS) tablet Take 1 tablet by mouth daily.  . Omega-3 Fatty Acids (FISH OIL) 1200 MG CAPS Take 1 capsule by mouth daily.  Marland Kitchen omeprazole (PRILOSEC) 20 MG capsule Take 20 mg by mouth as needed (indigestion).  . vitamin E 400 UNIT capsule Take 400 Units by mouth daily.     Allergies: No Known  Allergies  Social History: The patient  reports that she has never smoked. She has never used smokeless tobacco. She reports current alcohol use. She reports that she does not use drugs.   Family History: The patient's family history includes Atrial fibrillation in her father; COPD in her mother; Cancer (age of onset: 72) in her father; Colon polyps in her father; Dementia in her mother; Hypertension in her father; Rheum arthritis in her mother.   Review of Systems: Please see the history of present illness.   All other systems are reviewed and negative.   Physical Exam: VS:  BP 114/70   Pulse 60   Ht 5\' 7"  (1.702 m)   Wt 225 lb (102.1 kg)   SpO2 98%   BMI 35.24 kg/m  .  BMI Body mass index is 35.24 kg/m.  Wt Readings from Last 3 Encounters:  05/14/19 225 lb (102.1 kg)  03/12/19 215 lb (97.5 kg)  08/24/18 209 lb (94.8 kg)    General: Pleasant. Alert and in  no acute distress. She has gained weight.  HEENT: Normal.  Neck: Supple, no JVD, carotid bruits, or masses noted.  Cardiac: Regular rate and rhythm. No murmurs, rubs, or gallops. No edema. She has varicose veins noted.  Respiratory:  Lungs are clear to auscultation bilaterally with normal work of breathing.  GI: Soft and nontender.  MS: No deformity or atrophy. Gait and ROM intact.  Skin: Warm and dry. Color is normal.  Neuro:  Strength and sensation are intact and no gross focal deficits noted.  Psych: Alert, appropriate and with normal affect.   LABORATORY DATA:  EKG:  EKG is ordered today. This demonstrates NSR with HR of 60.  Lab Results  Component Value Date   WBC 5.6 05/25/2018   HGB 13.1 05/25/2018   HCT 38.9 05/25/2018   PLT 190 05/25/2018   GLUCOSE 79 05/25/2018   CHOL 176 05/25/2018   TRIG 66 05/25/2018   HDL 61 05/25/2018   LDLCALC 100 (H) 05/25/2018   ALT 11 05/25/2018   AST 16 05/25/2018   NA 138 05/25/2018   K 4.1 05/25/2018   CL 104 05/25/2018   CREATININE 0.73 05/25/2018   BUN 12 05/25/2018   CO2 26 05/25/2018   TSH 2.80 05/25/2018     BNP (last 3 results) No results for input(s): BNP in the last 8760 hours.  ProBNP (last 3 results) No results for input(s): PROBNP in the last 8760 hours.   Other Studies Reviewed Today:  Monitor Study Highlights 04/2019   Basic rhythm is NSR  Two non sustained SVT episodes, longet 11 beats not identified by patient  Triggered events did not correlated with rhythm disturbance.  Rare PVC's are noted.    Notes recorded by Belva Crome, MD on 05/03/2019 at 2:02 PM EDT  Let the patient know had brief PSVT and occasional PVC's. Monitor is benign.  A copy will be sent to Princess Bruins, MD    ECHO IMPRESSIONS 03/2019   1. The left ventricle has normal systolic function with an ejection fraction of 60-65%. The cavity size was normal. Left ventricular diastolic parameters were normal. No evidence of left  ventricular regional wall motion abnormalities.  2. The right ventricle has normal systolic function. The cavity was normal. There is no increase in right ventricular wall thickness. Right ventricular systolic pressure could not be assessed.  3. Trivial pericardial effusion is present.  4. No evidence of mitral valve stenosis.  5. The aortic valve is  tricuspid. No stenosis of the aortic valve.  6. The aorta is normal unless otherwise noted.  7. There is mild dilatation of the ascending aorta measuring 40 mm.   CTA CHEST 04/2019 IMPRESSION: 1. The ascending thoracic aorta has a maximum measured transverse diameter 3.8 x 3.8 cm. No thoracic aortic dissection.  2.  No evident pulmonary embolus.  3. Reflux of contrast into the inferior vena cava and hepatic veins may be indicative of a degree of increased right heart pressure.  4.  No lung edema or consolidation.  5.  Small hiatal hernia.  6.  No evident thoracic adenopathy.  Electronically Signed   By: Lowella Grip III M.D.  Notes recorded by Burtis Junes, NP on 04/24/2019 at 9:18 AM EDT  Ok to report. The CT shows that her aorta is prominent in size according to her age.  We will need to follow this going forward.  Would plan to repeat study in 2 years.  Keep good BP control.  Assessment/Plan:  1. Chest pain - short lived lone spell - no mention of calcium on her CT. Lots of stress endorsed and doing for everyone but herself - discussed at length.   2. Swelling - most likely from NSAID use - would favor Tylenol. I would go back to support stockings and hold on diuretic for now.   3. Reported murmur - I do not appreciate this today on exam.   4. Palpitations - now on beta blocker and doing better.   5. Situational stress - I suspect this is the crux of her issues - encouragement given. She needs the support of her siblings.   6. Prominent aorta - would repeat scan in about 2 years - not discussed today.    7. COVID-19 Education: The signs and symptoms of COVID-19 were discussed with the patient and how to seek care for testing (follow up with PCP or arrange E-visit).  The importance of social distancing, staying at home, hand hygiene and wearing a mask when out in public were discussed today.  Current medicines are reviewed with the patient today.  The patient does not have concerns regarding medicines other than what has been noted above.  The following changes have been made:  See above.  Labs/ tests ordered today include:    Orders Placed This Encounter  Procedures  . EKG 12-Lead     Disposition:   FU with me as planned.   Patient is agreeable to this plan and will call if any problems develop in the interim.   SignedTruitt Merle, NP  05/14/2019 2:42 PM  [facility] [address] [place], [place]  [address] Phone: [phone] Fax: [phone]

## 2019-05-14 ENCOUNTER — Encounter: Payer: Self-pay | Admitting: Nurse Practitioner

## 2019-05-14 ENCOUNTER — Other Ambulatory Visit: Payer: Self-pay

## 2019-05-14 ENCOUNTER — Ambulatory Visit (INDEPENDENT_AMBULATORY_CARE_PROVIDER_SITE_OTHER): Payer: 59 | Admitting: Nurse Practitioner

## 2019-05-14 VITALS — BP 114/70 | HR 60 | Ht 67.0 in | Wt 225.0 lb

## 2019-05-14 DIAGNOSIS — R609 Edema, unspecified: Secondary | ICD-10-CM

## 2019-05-14 DIAGNOSIS — R002 Palpitations: Secondary | ICD-10-CM | POA: Diagnosis not present

## 2019-05-14 DIAGNOSIS — R079 Chest pain, unspecified: Secondary | ICD-10-CM | POA: Diagnosis not present

## 2019-05-14 DIAGNOSIS — Z7189 Other specified counseling: Secondary | ICD-10-CM | POA: Diagnosis not present

## 2019-05-14 DIAGNOSIS — Z638 Other specified problems related to primary support group: Secondary | ICD-10-CM

## 2019-05-14 NOTE — Patient Instructions (Addendum)
After Visit Summary:  We will be checking the following labs today - NONE   Medication Instructions:    Continue with your current medicines.    If you need a refill on your cardiac medications before your next appointment, please call your pharmacy.     Testing/Procedures To Be Arranged:  N/A  Follow-Up:   See me as planned - if we need to move out your January visit we can.     At [facility], you and your health needs are our priority.  As part of our continuing mission to provide you with exceptional heart care, we have created designated Provider Care Teams.  These Care Teams include your primary Cardiologist (physician) and Advanced Practice Providers (APPs -  Physician Assistants and Nurse Practitioners) who all work together to provide you with the care you need, when you need it.  Special Instructions:  . Stay safe, stay home, wash your hands for at least 20 seconds and wear a mask when out in public.  . It was good to talk with you today.  . Think about what we talked about today.  . Support stockings.  . Restrict your salt. Marland Kitchen No NSAID . Talk with your brothers.      Call the [place] office at [phone] if you have any questions, problems or concerns.

## 2019-07-09 ENCOUNTER — Other Ambulatory Visit: Payer: Self-pay | Admitting: Obstetrics & Gynecology

## 2019-07-30 ENCOUNTER — Ambulatory Visit: Payer: 59 | Admitting: Nurse Practitioner

## 2019-08-07 NOTE — Progress Notes (Deleted)
CARDIOLOGY OFFICE NOTE  Date:  08/07/2019    Mick Sell Date of Birth: 04-Apr-1966 Medical Record [id number]  PCP:  Princess Bruins, MD  Cardiologist:  Servando Snare & ***    No chief complaint on file.   History of Present Illness: DEBORAN MCKINNEY is a 54 y.o. female who presents today for a *** She follows with me.  I took care of her father (he had multiple health issues and ended up dying following a bad car wreck) and care for her mom as well.  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 She had been told she had a heart murmur. She had palpitations. We got a monitor - she had 2 runs of NSVT - beta blocker was started. An echo showed a dilated aorta - we did a CT - her aorta is more prominent for her age. Has normal EF by echo.  The patient does not have symptoms concerning for COVID-19 infection (fever, chills, cough, or new shortness of breath).   Comes in today. Here alone. She has had more in the way of swelling in her feet - may have been using extra NSAID due to plantar fascitis. Has had prior vein work on her legs in the past. She has used support stockings in the past. Not using that much salt. Lots of stress with her mom. She has 2 brothers - one is here and one in [place] - the one here helps some. Not really able to make time for herself and is worried about what would happen if her own health fails. She had one incident of chest tightness - this was at rest - very short lived - she has had no exertional symptoms and it has not returned. The beta blocker has helped with her palpitations.   The patient {does/does not:200015} have symptoms concerning for COVID-19 infection (fever, chills, cough, or new shortness of breath).   Comes in today. Here with   Past Medical History:  Diagnosis Date  . Elective abortion    X2  . H/O peripheral vascular disease 2006  . Heart murmur   . Hx of  cystitis 2006  . Hypertension    pregnancy induced  . Hypothyroidism   . Missed ab     Past Surgical History:  Procedure Laterality Date  . CESAREAN SECTION    . CESAREAN SECTION W/BTL    . DILATION AND CURETTAGE OF UTERUS    . ENDOMETRIAL ABLATION     HTA  . ENDOSCOPIC VEIN LASER TREATMENT    . vein closure procedure Left 2006     Medications: No outpatient medications have been marked as taking for the 08/13/19 encounter (Appointment) with Burtis Junes, NP.     Allergies: No Known Allergies  Social History: The patient  reports that she has never smoked. She has never used smokeless tobacco. She reports current alcohol use. She reports that she does not use drugs.   Family History: The patient's ***family history includes Atrial fibrillation in her father; COPD in her mother; Cancer (age of onset: 72) in her father; Colon polyps in her father; Dementia in her mother; Hypertension in her father; Rheum arthritis in her mother.   Review of Systems: Please see the history of present illness.   All other systems are reviewed and negative.   Physical Exam: VS:  There were no vitals taken for this visit. Marland Kitchen  BMI There is no height or weight on file to calculate BMI.  Wt Readings from Last 3 Encounters:  05/14/19 225 lb (102.1 kg)  03/12/19 215 lb (97.5 kg)  08/24/18 209 lb (94.8 kg)    General: Pleasant. Well developed, well nourished and in no acute distress.   HEENT: Normal.  Neck: Supple, no JVD, carotid bruits, or masses noted.  Cardiac: ***Regular rate and rhythm. No murmurs, rubs, or gallops. No edema.  Respiratory:  Lungs are clear to auscultation bilaterally with normal work of breathing.  GI: Soft and nontender.  MS: No deformity or atrophy. Gait and ROM intact.  Skin: Warm and dry. Color is normal.  Neuro:  Strength and sensation are intact and no gross focal deficits noted.  Psych: Alert, appropriate and with normal affect.   LABORATORY DATA:  EKG:   EKG {ACTION; IS/IS [id number] ordered today. This demonstrates ***.  Lab Results  Component Value Date   WBC 5.6 05/25/2018   HGB 13.1 05/25/2018   HCT 38.9 05/25/2018   PLT 190 05/25/2018   GLUCOSE 79 05/25/2018   CHOL 176 05/25/2018   TRIG 66 05/25/2018   HDL 61 05/25/2018   LDLCALC 100 (H) 05/25/2018   ALT 11 05/25/2018   AST 16 05/25/2018   NA 138 05/25/2018   K 4.1 05/25/2018   CL 104 05/25/2018   CREATININE 0.73 05/25/2018   BUN 12 05/25/2018   CO2 26 05/25/2018   TSH 2.80 05/25/2018     BNP (last 3 results) No results for input(s): BNP in the last 8760 hours.  ProBNP (last 3 results) No results for input(s): PROBNP in the last 8760 hours.   Other Studies Reviewed Today:   Assessment/Plan: Monitor Study Highlights 04/2019   Basic rhythm is NSR  Two non sustained SVT episodes, longet 11 beats not identified by patient  Triggered events did not correlated with rhythm disturbance.  Rare PVC's are noted.   Notes recorded by Belva Crome, MD on 05/03/2019 at 2:02 PM EDT  Let the patient know had brief PSVT and occasional PVC's. Monitor is benign.  A copy will be sent to Princess Bruins, MD    ECHO IMPRESSIONS 03/2019  1. The left ventricle has normal systolic function with an ejection fraction of 60-65%. The cavity size was normal. Left ventricular diastolic parameters were normal. No evidence of left ventricular regional wall motion abnormalities. 2. The right ventricle has normal systolic function. The cavity was normal. There is no increase in right ventricular wall thickness. Right ventricular systolic pressure could not be assessed. 3. Trivial pericardial effusion is present. 4. No evidence of mitral valve stenosis. 5. The aortic valve is tricuspid. No stenosis of the aortic valve. 6. The aorta is normal unless otherwise noted. 7. There is mild dilatation of the ascending aorta measuring 40 mm.   CTA CHEST 04/2019  IMPRESSION: 1. The ascending thoracic aorta has a maximum measured transverse diameter 3.8 x 3.8 cm. No thoracic aortic dissection.  2. No evident pulmonary embolus.  3. Reflux of contrast into the inferior vena cava and hepatic veins may be indicative of a degree of increased right heart pressure.  4. No lung edema or consolidation.  5. Small hiatal hernia.  6. No evident thoracic adenopathy.  Electronically Signed By: Lowella Grip III M.D.  Notes recorded by Burtis Junes, NP on 04/24/2019 at 9:18 AM EDT  Ok to report. The CT shows that her aorta is prominent in size according to her age.  We will need to follow this going forward.  Would plan to repeat study in 2 years.  Keep good BP control.  Assessment/Plan:  1. Chest pain - short lived lone spell - no mention of calcium on her CT. Lots of stress endorsed and doing for everyone but herself - discussed at length.   2. Swelling - most likely from NSAID use - would favor Tylenol. I would go back to support stockings and hold on diuretic for now.   3. Reported murmur - I do not appreciate this today on exam.   4. Palpitations - now on beta blocker and doing better.   5. Situational stress - I suspect this is the crux of her issues - encouragement given. She needs the support of her siblings.   6. Prominent aorta - would repeat scan in about 2 years - not discussed today.    Marland Kitchen COVID-19 Education: The signs and symptoms of COVID-19 were discussed with the patient and how to seek care for testing (follow up with PCP or arrange E-visit).  The importance of social distancing, staying at home, hand hygiene and wearing a mask when out in public were discussed today.  Current medicines are reviewed with the patient today.  The patient does not have concerns regarding medicines other than what has been noted above.  The following changes have been made:  See above.  Labs/ tests ordered today include:    No orders of the defined types were placed in this encounter.    Disposition:   FU with *** in {gen number [id number] {Days to years:10300}.   Patient is agreeable to this plan and will call if any problems develop in the interim.   SignedTruitt Merle, NP  08/07/2019 8:25 AM  [facility] [address] [place], [place]  [address] Phone: [phone] Fax: [phone]

## 2019-08-13 ENCOUNTER — Ambulatory Visit: Payer: 59 | Admitting: Nurse Practitioner

## 2019-09-27 ENCOUNTER — Ambulatory Visit: Payer: 59 | Attending: Internal Medicine

## 2019-09-27 DIAGNOSIS — Z23 Encounter for immunization: Secondary | ICD-10-CM

## 2019-09-27 NOTE — Progress Notes (Signed)
   Covid-19 Vaccination Clinic  Name:  Erica Hull    MRN: [id number] DOB: [birth year]  09/27/2019  [name] was observed post Covid-19 immunization for 15 minutes without incident. She was provided with Vaccine Information Sheet and instruction to access the V-Safe system.   [name] was instructed to call 911 with any severe reactions post vaccine: Marland Kitchen Difficulty breathing  . Swelling of face and throat  . A fast heartbeat  . A bad rash all over body  . Dizziness and weakness   Immunizations Administered    Name Date Dose VIS Date Route   Moderna COVID-19 Vaccine 09/27/2019 11:45 AM 0.5 mL 06/11/2019 Intramuscular   Manufacturer: Moderna   Lot: [id number]   [id number]

## 2019-10-29 ENCOUNTER — Ambulatory Visit: Payer: 59 | Attending: Internal Medicine

## 2019-10-29 DIAGNOSIS — Z23 Encounter for immunization: Secondary | ICD-10-CM

## 2019-10-29 NOTE — Progress Notes (Signed)
   Covid-19 Vaccination Clinic  Name:  Erica Hull    MRN: [id number] DOB: [birth year]  10/29/2019  [name] was observed post Covid-19 immunization for 15 minutes without incident. She was provided with Vaccine Information Sheet and instruction to access the V-Safe system.   [name] was instructed to call 911 with any severe reactions post vaccine: Marland Kitchen Difficulty breathing  . Swelling of face and throat  . A fast heartbeat  . A bad rash all over body  . Dizziness and weakness   Immunizations Administered    Name Date Dose VIS Date Route   Moderna COVID-19 Vaccine 10/29/2019 11:38 AM 0.5 mL 06/2019 Intramuscular   Manufacturer: Moderna   Lot: [id number]   [place][id number]

## 2019-11-17 ENCOUNTER — Other Ambulatory Visit: Payer: Self-pay | Admitting: Obstetrics & Gynecology

## 2019-12-11 ENCOUNTER — Other Ambulatory Visit: Payer: Self-pay | Admitting: Obstetrics & Gynecology

## 2019-12-17 ENCOUNTER — Telehealth: Payer: Self-pay | Admitting: *Deleted

## 2019-12-17 MED ORDER — LEVOTHYROXINE SODIUM 88 MCG PO TABS: ORAL_TABLET | ORAL | 0 refills | Status: DC

## 2019-12-17 NOTE — Telephone Encounter (Signed)
Patient called requesting refill on synthroid 88 mcg tablets, annual exam scheduled on 03/04/20. Rx sent to mail order [facility].

## 2020-01-11 ENCOUNTER — Other Ambulatory Visit: Payer: Self-pay | Admitting: Nurse Practitioner

## 2020-01-15 ENCOUNTER — Other Ambulatory Visit: Payer: Self-pay

## 2020-01-15 MED ORDER — METOPROLOL SUCCINATE ER 25 MG PO TB24: 25.0000 mg | ORAL_TABLET | Freq: Every day | ORAL | 1 refills | Status: DC

## 2020-02-08 IMAGING — CT CT ANGIO CHEST
2 of 7 series · 18 of 46 positions shown · IV contrast (OMNIPAQUE 350)
Comparison: Report of echocardiogram March 22, 2019

CLINICAL DATA: Ascending thoracic aortic prominence noted on recent
echocardiogram

EXAM:
CT ANGIOGRAPHY CHEST WITH CONTRAST
TECHNIQUE: Multidetector CT imaging of the chest was performed using the
standard protocol during bolus administration of intravenous
contrast. Multiplanar CT image reconstructions and MIPs were
obtained to evaluate the vascular anatomy.
CONTRAST:  100mL OMNIPAQUE IOHEXOL 350 MG/ML SOLN

[Series 4: aorta 3.0 i31f 2 · axial · 0.73mm/px · z∈[-335,-50]mm · 15 of 103 slices shown]
[im 4/103  lung]
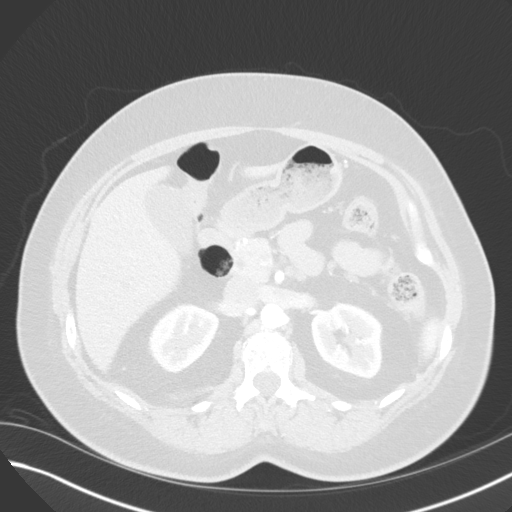
[im 11/103  soft-tissue]
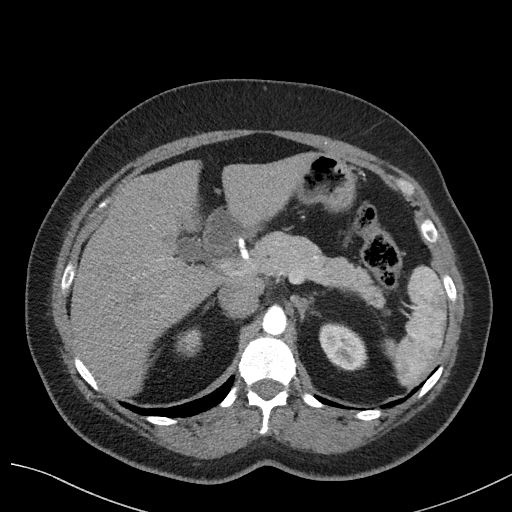
[im 19/103  lung]
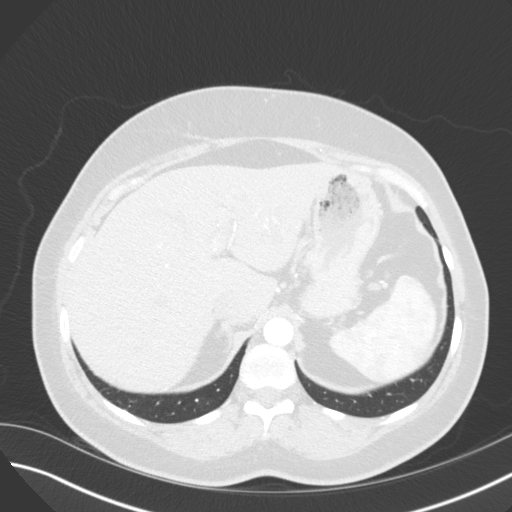
[im 26/103  soft-tissue]
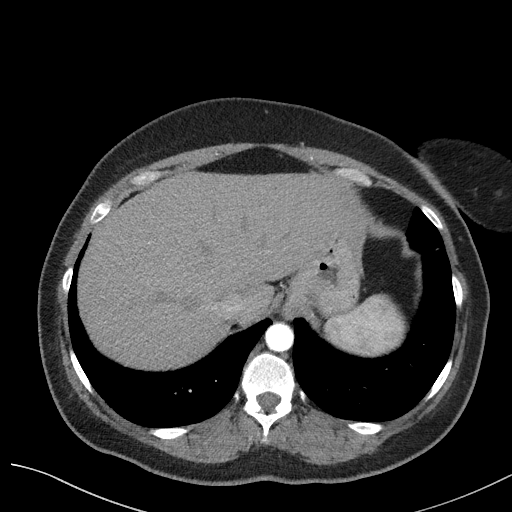
[im 33/103  lung]
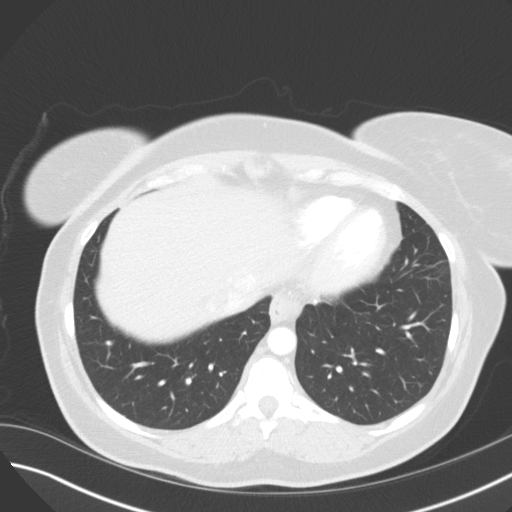
[im 37/103  soft-tissue]
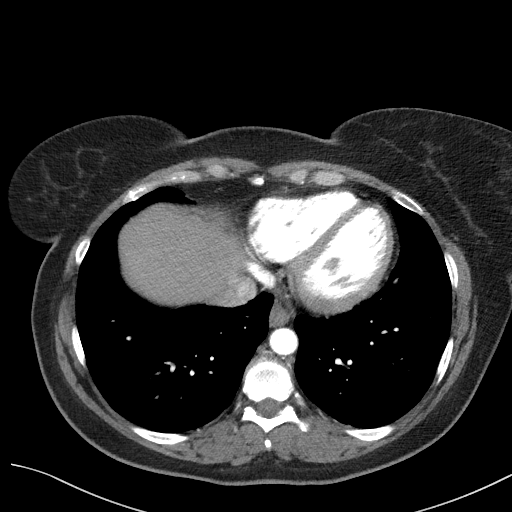
[im 44/103  lung]
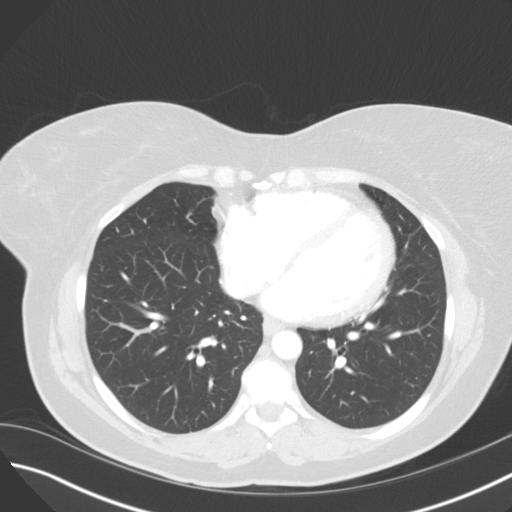
[im 52/103  soft-tissue]
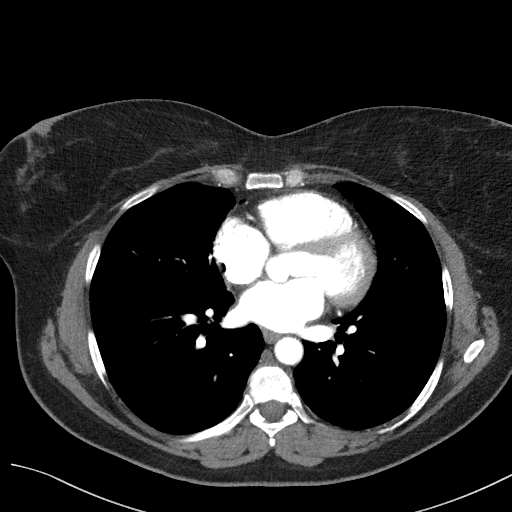
[im 59/103  lung]
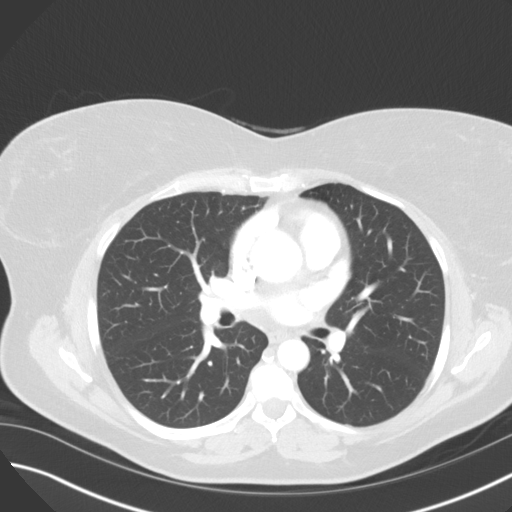
[im 66/103  soft-tissue]
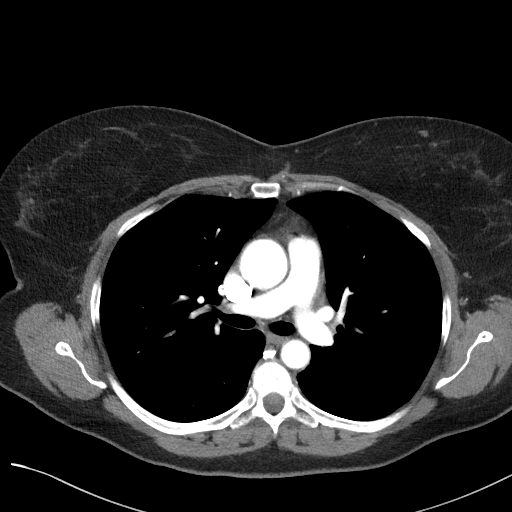
[im 70/103  lung]
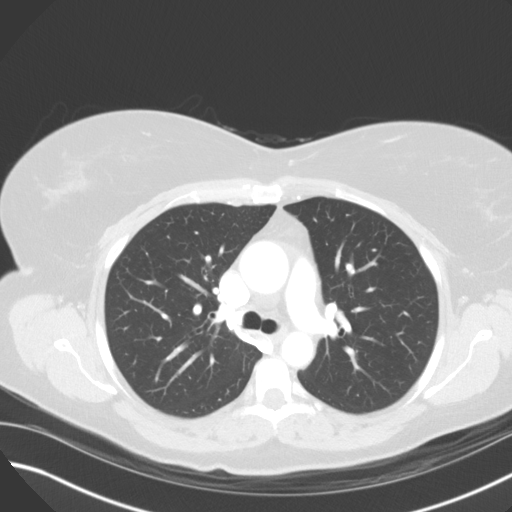
[im 77/103  soft-tissue]
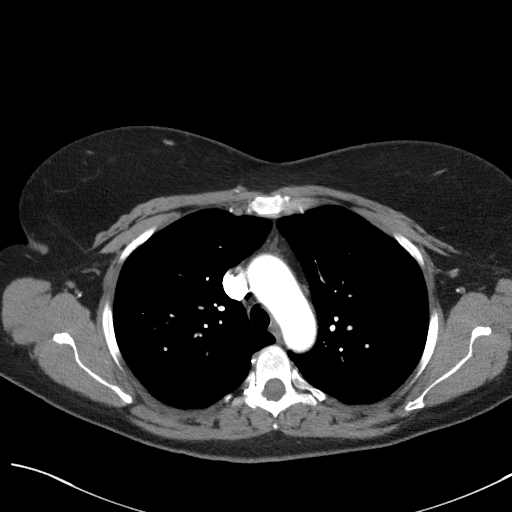
[im 84/103  lung]
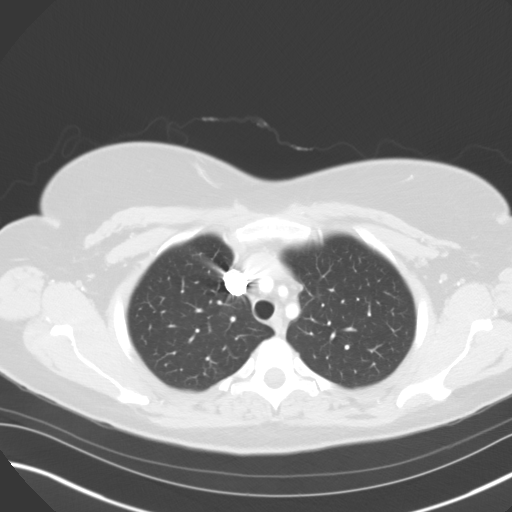
[im 92/103  soft-tissue]
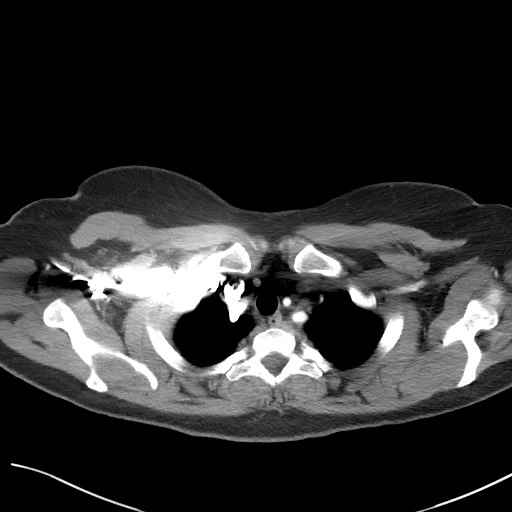
[im 99/103  lung]
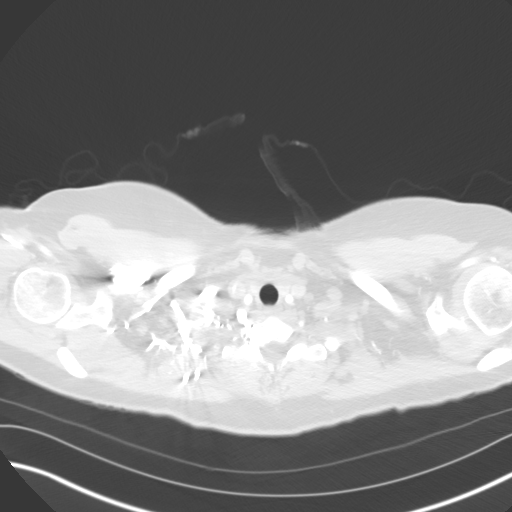

[Series 7: coronals · coronal · 0.62mm/px · 3 of 116 slices shown]
[im 29/116  soft-tissue]
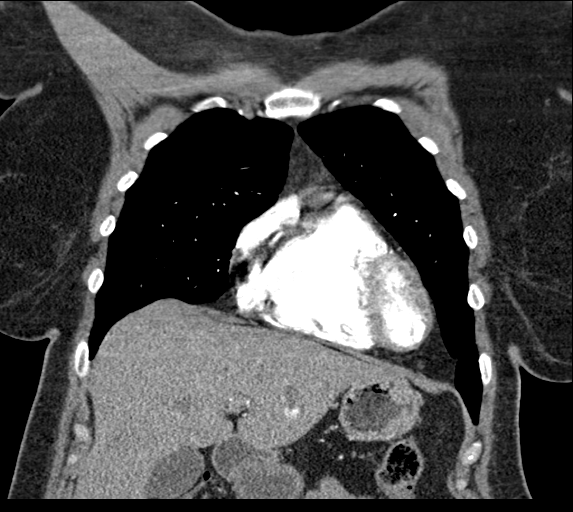
[im 58/116  soft-tissue]
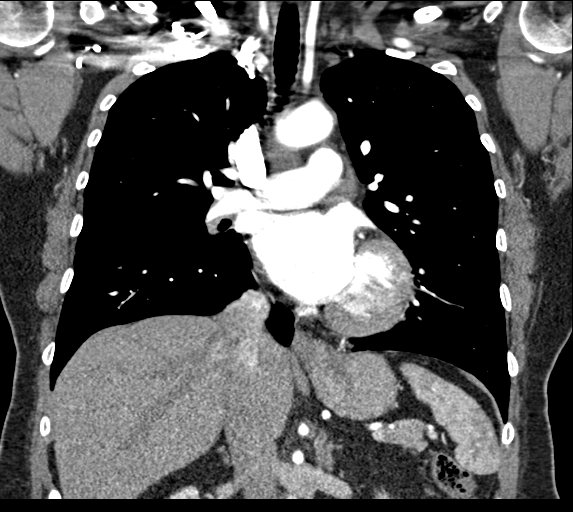
[im 87/116  soft-tissue]
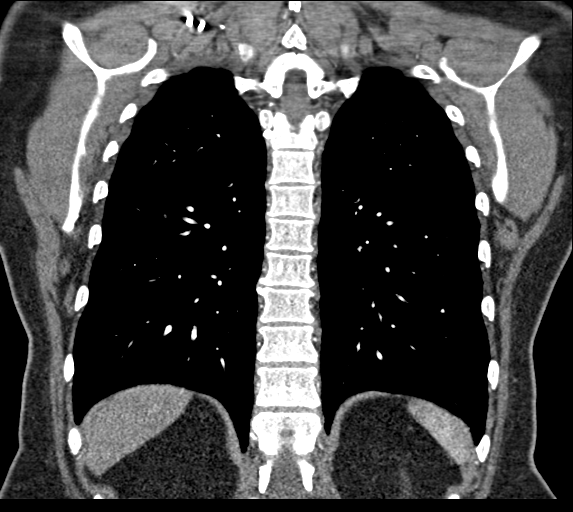

[18 of 46 positions shown; findings below may reference images not displayed]

FINDINGS: Cardiovascular: Ascending thoracic aortic diameter is measured at
3.8 x 3.8 cm. The measured diameter at the sinuses of Valsalva
measures 3.5 cm. Descending thoracic aortic diameter measures 2.3 x
2.4 cm. There is no thoracic aortic dissection. The visualized great
vessels appear unremarkable. There is no demonstrable pulmonary
embolus. There is no pericardial effusion or pericardial thickening
evident.

Mediastinum/Nodes: Visualized thyroid appears unremarkable except
for a 4 mm nodular opacity in the left lobe, which per consensus
guidelines does not warrant additional imaging surveillance. There
is no evident thoracic adenopathy. There is a small hiatal hernia.

Lungs/Pleura: There is slight atelectatic change in each anterior
lung base. There is no edema or consolidation. No pleural effusions
are evident.

Upper Abdomen: There is reflux of contrast into the inferior vena
cava and hepatic veins. Visualized upper abdominal structures
otherwise appear unremarkable.

Musculoskeletal: No blastic or lytic bone lesions are evident. No
chest wall lesions appreciable.

Review of the MIP images confirms the above findings.
IMPRESSION: 1. The ascending thoracic aorta has a maximum measured transverse
diameter 3.8 x 3.8 cm. No thoracic aortic dissection.

2.  No evident pulmonary embolus.

3. Reflux of contrast into the inferior vena cava and hepatic veins
may be indicative of a degree of increased right heart pressure.

4.  No lung edema or consolidation.

5.  Small hiatal hernia.

6.  No evident thoracic adenopathy.

## 2020-02-22 ENCOUNTER — Other Ambulatory Visit: Payer: Self-pay | Admitting: Obstetrics & Gynecology

## 2020-02-24 NOTE — Telephone Encounter (Signed)
Annual exam now scheduled on 04/06/20

## 2020-03-04 ENCOUNTER — Encounter: Payer: 59 | Admitting: Obstetrics & Gynecology

## 2020-03-24 ENCOUNTER — Encounter: Payer: Self-pay | Admitting: Obstetrics & Gynecology

## 2020-04-06 ENCOUNTER — Other Ambulatory Visit: Payer: Self-pay

## 2020-04-06 ENCOUNTER — Ambulatory Visit (INDEPENDENT_AMBULATORY_CARE_PROVIDER_SITE_OTHER): Payer: 59 | Admitting: Obstetrics & Gynecology

## 2020-04-06 ENCOUNTER — Encounter: Payer: Self-pay | Admitting: Obstetrics & Gynecology

## 2020-04-06 VITALS — BP 130/80 | Ht 66.0 in | Wt 230.0 lb

## 2020-04-06 DIAGNOSIS — D219 Benign neoplasm of connective and other soft tissue, unspecified: Secondary | ICD-10-CM

## 2020-04-06 DIAGNOSIS — Z9851 Tubal ligation status: Secondary | ICD-10-CM | POA: Diagnosis not present

## 2020-04-06 DIAGNOSIS — Z23 Encounter for immunization: Secondary | ICD-10-CM

## 2020-04-06 DIAGNOSIS — E6609 Other obesity due to excess calories: Secondary | ICD-10-CM | POA: Diagnosis not present

## 2020-04-06 DIAGNOSIS — Z6837 Body mass index (BMI) 37.0-37.9, adult: Secondary | ICD-10-CM

## 2020-04-06 DIAGNOSIS — Z01419 Encounter for gynecological examination (general) (routine) without abnormal findings: Secondary | ICD-10-CM | POA: Diagnosis not present

## 2020-04-06 DIAGNOSIS — E66812 Obesity, class 2: Secondary | ICD-10-CM

## 2020-04-06 NOTE — Addendum Note (Signed)
Addended by: Lorine Bears on: 04/06/2020 11:57 AM   Modules accepted: Orders

## 2020-04-06 NOTE — Progress Notes (Signed)
  Erica Hull 10/08/1965 [id number]   History:    53 y.o. G4P2A2L2 Married.  S/P TL.  Son 23 yo, daughter 16 yo.  RP:  Established patient presenting for annual gyn exam   HPI: Menses every 1-3 months, flow is stable on heavy side.  No pain with IC.   S/P TL.  Urine/BMs wnl.  Breasts normal.  BMI 37.12.  Difficult to find time to exercise.  Fasting Health Labs here today.  Will schedule Colonoscopy in 2019.  Past medical history,surgical history, family history and social history were all reviewed and documented in the EPIC chart.  Gynecologic History Patient's last menstrual period was 02/29/2020.  Obstetric History OB History  Gravida Para Term Preterm AB Living  4 2 2   2 2  SAB TAB Ectopic Multiple Live Births          2    # Outcome Date GA Lbr Len/2nd Weight Sex Delivery Anes PTL Lv  4 AB           3 AB           2 Term     F CS-Unspec  N LIV  1 Term     M CS-Unspec  N LIV     ROS: A ROS was performed and pertinent positives and negatives are included in the history.  GENERAL: No fevers or chills. HEENT: No change in vision, no earache, sore throat or sinus congestion. NECK: No pain or stiffness. CARDIOVASCULAR: No chest pain or pressure. No palpitations. PULMONARY: No shortness of breath, cough or wheeze. GASTROINTESTINAL: No abdominal pain, nausea, vomiting or diarrhea, melena or bright red blood per rectum. GENITOURINARY: No urinary frequency, urgency, hesitancy or dysuria. MUSCULOSKELETAL: No joint or muscle pain, no back pain, no recent trauma. DERMATOLOGIC: No rash, no itching, no lesions. ENDOCRINE: No polyuria, polydipsia, no heat or cold intolerance. No recent change in weight. HEMATOLOGICAL: No anemia or easy bruising or bleeding. NEUROLOGIC: No headache, seizures, numbness, tingling or weakness. PSYCHIATRIC: No depression, no loss of interest in normal activity or change in sleep pattern.     Exam:   BP 130/80 (BP Location: Right Arm, Patient Position:  Sitting, Cuff Size: Large)   Ht 5' 6" (1.676 m)   Wt 230 lb (104.3 kg)   LMP 02/29/2020   BMI 37.12 kg/m   Body mass index is 37.12 kg/m.  General appearance : Well developed well nourished female. No acute distress HEENT: Eyes: no retinal hemorrhage or exudates,  Neck supple, trachea midline, no carotid bruits, no thyroidmegaly Lungs: Clear to auscultation, no rhonchi or wheezes, or rib retractions  Heart: Regular rate and rhythm, no murmurs or gallops Breast:Examined in sitting and supine position were symmetrical in appearance, no palpable masses or tenderness,  no skin retraction, no nipple inversion, no nipple discharge, no skin discoloration, no axillary or supraclavicular lymphadenopathy Abdomen: no palpable masses or tenderness, no rebound or guarding Extremities: no edema or skin discoloration or tenderness  Pelvic: Vulva: Normal             Vagina: No gross lesions or discharge  Cervix: No gross lesions or discharge.  Pap reflex done.  Uterus AV, normal size, shape and consistency, non-tender and mobile  Adnexa  Without masses or tenderness  Anus: Normal   Assessment/Plan:  53 y.o. female for annual exam   1. Encounter for routine gynecological examination with Papanicolaou smear of cervix Normal gynecologic exam.  Pap reflex done.  Breast exam normal.    Had a screening mammogram at [facility] September 2021, normal per patient.  Will obtain report.  Colonoscopy 2019.  Fasting health labs here today. - CBC - Comp Met (CMET) - Lipid panel - TSH - VITAMIN D 25 Hydroxy (Vit-D Deficiency, Fractures)  2. S/P tubal ligation  3. Fibroids Stable fibroids per exam.  Menses every 1 to 3 months with stable flow.  We will continue to observe.  4. Class 2 obesity due to excess calories without serious comorbidity with body mass index (BMI) of 37.0 to 37.9 in adult Lower calorie/carb diet recommended.  Aerobic activities 5 times a week and light weightlifting every 2  days.  Marie-Lyne Lavoie MD, 10:54 AM 04/06/2020   

## 2020-04-07 LAB — VITAMIN D 25 HYDROXY (VIT D DEFICIENCY, FRACTURES): Vit D, 25-Hydroxy: 53 ng/mL (ref 30–100)

## 2020-04-07 LAB — PAP IG W/ RFLX HPV ASCU

## 2020-04-07 LAB — COMPREHENSIVE METABOLIC PANEL
AG Ratio: 1.9 (calc) (ref 1.0–2.5)
ALT: 11 U/L (ref 6–29)
AST: 17 U/L (ref 10–35)
Albumin: 4.2 g/dL (ref 3.6–5.1)
Alkaline phosphatase (APISO): 58 U/L (ref 37–153)
BUN: 12 mg/dL (ref 7–25)
CO2: 28 mmol/L (ref 20–32)
Calcium: 9.6 mg/dL (ref 8.6–10.4)
Chloride: 103 mmol/L (ref 98–110)
Creat: 0.77 mg/dL (ref 0.50–1.05)
Globulin: 2.2 g/dL (calc) (ref 1.9–3.7)
Glucose, Bld: 80 mg/dL (ref 65–99)
Potassium: 4 mmol/L (ref 3.5–5.3)
Sodium: 138 mmol/L (ref 135–146)
Total Bilirubin: 0.7 mg/dL (ref 0.2–1.2)
Total Protein: 6.4 g/dL (ref 6.1–8.1)

## 2020-04-07 LAB — LIPID PANEL
Cholesterol: 211 mg/dL — ABNORMAL HIGH (ref <200)
HDL: 58 mg/dL (ref >=50)
LDL Cholesterol (Calc): 135 mg/dL (calc) — ABNORMAL HIGH
Non-HDL Cholesterol (Calc): 153 mg/dL (calc) — ABNORMAL HIGH (ref <130)
Total CHOL/HDL Ratio: 3.6 (calc) (ref <5.0)
Triglycerides: 82 mg/dL (ref <150)

## 2020-04-07 LAB — TSH: TSH: 3.93 mIU/L

## 2020-04-07 LAB — CBC
HCT: 43.7 % (ref 35.0–45.0)
Hemoglobin: 14.6 g/dL (ref 11.7–15.5)
MCH: 29.4 pg (ref 27.0–33.0)
MCHC: 33.4 g/dL (ref 32.0–36.0)
MCV: 87.9 fL (ref 80.0–100.0)
MPV: 10.9 fL (ref 7.5–12.5)
Platelets: 202 10*3/uL (ref 140–400)
RBC: 4.97 10*6/uL (ref 3.80–5.10)
RDW: 12.1 % (ref 11.0–15.0)
WBC: 5.4 10*3/uL (ref 3.8–10.8)

## 2020-04-22 ENCOUNTER — Other Ambulatory Visit: Payer: Self-pay

## 2020-04-22 MED ORDER — METOPROLOL SUCCINATE ER 25 MG PO TB24: 25.0000 mg | ORAL_TABLET | Freq: Every day | ORAL | 0 refills | Status: DC

## 2020-04-23 ENCOUNTER — Other Ambulatory Visit: Payer: Self-pay | Admitting: Obstetrics & Gynecology

## 2020-07-18 ENCOUNTER — Other Ambulatory Visit: Payer: Self-pay | Admitting: Nurse Practitioner

## 2020-12-30 ENCOUNTER — Other Ambulatory Visit: Payer: Self-pay | Admitting: *Deleted

## 2020-12-30 MED ORDER — METOPROLOL SUCCINATE ER 25 MG PO TB24: 1.0000 | ORAL_TABLET | Freq: Every day | ORAL | 0 refills | Status: DC

## 2021-03-03 ENCOUNTER — Other Ambulatory Visit: Payer: Self-pay

## 2021-03-03 MED ORDER — METOPROLOL SUCCINATE ER 25 MG PO TB24: 25.0000 mg | ORAL_TABLET | Freq: Every day | ORAL | 0 refills | Status: DC

## 2021-04-02 ENCOUNTER — Encounter: Payer: Self-pay | Admitting: Obstetrics & Gynecology

## 2021-04-07 ENCOUNTER — Ambulatory Visit: Payer: 59 | Admitting: Obstetrics & Gynecology

## 2021-05-11 ENCOUNTER — Other Ambulatory Visit: Payer: Self-pay | Admitting: Obstetrics & Gynecology

## 2021-05-12 NOTE — Telephone Encounter (Signed)
Annual exam scheduled on 07/02/21 Last TSH level check on 04/06/20

## 2021-06-09 ENCOUNTER — Other Ambulatory Visit: Payer: Self-pay

## 2021-06-09 MED ORDER — METOPROLOL SUCCINATE ER 25 MG PO TB24: 25.0000 mg | ORAL_TABLET | Freq: Every day | ORAL | 0 refills | Status: DC

## 2021-07-02 ENCOUNTER — Ambulatory Visit (INDEPENDENT_AMBULATORY_CARE_PROVIDER_SITE_OTHER): Payer: 59 | Admitting: Obstetrics & Gynecology

## 2021-07-02 ENCOUNTER — Encounter: Payer: Self-pay | Admitting: Obstetrics & Gynecology

## 2021-07-02 ENCOUNTER — Other Ambulatory Visit: Payer: Self-pay

## 2021-07-02 VITALS — BP 122/82 | HR 94 | Ht 65.25 in | Wt 232.0 lb

## 2021-07-02 DIAGNOSIS — E6609 Other obesity due to excess calories: Secondary | ICD-10-CM | POA: Diagnosis not present

## 2021-07-02 DIAGNOSIS — Z01419 Encounter for gynecological examination (general) (routine) without abnormal findings: Secondary | ICD-10-CM | POA: Diagnosis not present

## 2021-07-02 DIAGNOSIS — Z6838 Body mass index (BMI) 38.0-38.9, adult: Secondary | ICD-10-CM | POA: Diagnosis not present

## 2021-07-02 DIAGNOSIS — Z9851 Tubal ligation status: Secondary | ICD-10-CM | POA: Diagnosis not present

## 2021-07-02 NOTE — Progress Notes (Signed)
° ° °Erica Hull 09/27/1965 [id number] ° ° °History:    55 y.o.  G4P2A2L2 Married.  S/P TL.  Son 24 yo moved out of the house, daughter 17 yo going to [employer] next year. °  °RP:  Established patient presenting for annual gyn exam  °  °HPI: Menses every 1-3 months, flow is moderate, stable.  No BTB.  No pain with IC.  Pap Neg 03/2020.  S/P TL.  Urine/BMs wnl.  Breasts normal.  Mammo 03/2021 Neg.  BMI 38.31.  Difficult to find time to exercise.  Fasting Health Labs here today.  Colonoscopy Normal in 2020. ° ° °Past medical history,surgical history, family history and social history were all reviewed and documented in the EPIC chart. ° °Gynecologic History °Patient's last menstrual period was 04/09/2021 (exact date). ° °Obstetric History °OB History  °Gravida Para Term Preterm AB Living  °4 2 2   2 2  °SAB IAB Ectopic Multiple Live Births  °        2  °  °# Outcome Date GA Lbr Len/2nd Weight Sex Delivery Anes PTL Lv  °4 AB           °3 AB           °2 Term     F CS-Unspec  N LIV  °1 Term     M CS-Unspec  N LIV  ° ° ° °ROS: A ROS was performed and pertinent positives and negatives are included in the history. ° GENERAL: No fevers or chills. HEENT: No change in vision, no earache, sore throat or sinus congestion. NECK: No pain or stiffness. CARDIOVASCULAR: No chest pain or pressure. No palpitations. PULMONARY: No shortness of breath, cough or wheeze. GASTROINTESTINAL: No abdominal pain, nausea, vomiting or diarrhea, melena or bright red blood per rectum. GENITOURINARY: No urinary frequency, urgency, hesitancy or dysuria. MUSCULOSKELETAL: No joint or muscle pain, no back pain, no recent trauma. DERMATOLOGIC: No rash, no itching, no lesions. ENDOCRINE: No polyuria, polydipsia, no heat or cold intolerance. No recent change in weight. HEMATOLOGICAL: No anemia or easy bruising or bleeding. NEUROLOGIC: No headache, seizures, numbness, tingling or weakness. PSYCHIATRIC: No depression, no loss of interest in normal  activity or change in sleep pattern.  °  ° °Exam: ° ° °BP 122/82    Pulse 94    Ht 5' 5.25" (1.657 m)    Wt 232 lb (105.2 kg)    LMP 04/09/2021 (Exact Date) Comment: Becoming irregular--spreading apart   SpO2 97%    BMI 38.31 kg/m²  ° °Body mass index is 38.31 kg/m². ° °General appearance : Well developed well nourished female. No acute distress °HEENT: Eyes: no retinal hemorrhage or exudates,  Neck supple, trachea midline, no carotid bruits, no thyroidmegaly °Lungs: Clear to auscultation, no rhonchi or wheezes, or rib retractions  °Heart: Regular rate and rhythm, no murmurs or gallops °Breast:Examined in sitting and supine position were symmetrical in appearance, no palpable masses or tenderness,  no skin retraction, no nipple inversion, no nipple discharge, no skin discoloration, no axillary or supraclavicular lymphadenopathy °Abdomen: no palpable masses or tenderness, no rebound or guarding °Extremities: no edema or skin discoloration or tenderness ° °Pelvic: Vulva: Normal °            Vagina: No gross lesions or discharge ° Cervix: No gross lesions or discharge ° Uterus  AV, normal size, shape and consistency, non-tender and mobile ° Adnexa  Without masses or tenderness ° Anus: Normal ° ° °Assessment/Plan:    55 y.o. female for annual exam   1. Well female exam with routine gynecological exam Menses every 1-3 months, flow is moderate, stable.  No BTB.  No pain with IC.  Pap Neg 03/2020.  S/P TL.  Urine/BMs wnl.  Breasts normal.  Mammo 03/2021 Neg.  BMI 38.31.  Difficult to find time to exercise.  Fasting Health Labs here today. Colonoscopy Normal in 2020. - CBC - Comp Met (CMET) - Lipid Profile - TSH - Vitamin D (25 hydroxy)  2. S/P tubal ligation  3. Class 2 obesity due to excess calories without serious comorbidity with body mass index (BMI) of 38.0 to 38.9 in adult Low calorie/carb diet.  Increase fitness activities.  Other orders - Cholecalciferol (D3-1000) 25 MCG (1000 UT) tablet   Princess Bruins MD, 9:58 AM 07/02/2021

## 2021-07-03 LAB — VITAMIN D 25 HYDROXY (VIT D DEFICIENCY, FRACTURES): Vit D, 25-Hydroxy: 46 ng/mL (ref 30–100)

## 2021-07-03 LAB — CBC
HCT: 38.4 % (ref 35.0–45.0)
Hemoglobin: 12.6 g/dL (ref 11.7–15.5)
MCH: 28.3 pg (ref 27.0–33.0)
MCHC: 32.8 g/dL (ref 32.0–36.0)
MCV: 86.1 fL (ref 80.0–100.0)
MPV: 10.8 fL (ref 7.5–12.5)
Platelets: 207 10*3/uL (ref 140–400)
RBC: 4.46 10*6/uL (ref 3.80–5.10)
RDW: 13.1 % (ref 11.0–15.0)
WBC: 5.4 10*3/uL (ref 3.8–10.8)

## 2021-07-03 LAB — COMPREHENSIVE METABOLIC PANEL
AG Ratio: 1.8 (calc) (ref 1.0–2.5)
ALT: 17 U/L (ref 6–29)
AST: 23 U/L (ref 10–35)
Albumin: 4.2 g/dL (ref 3.6–5.1)
Alkaline phosphatase (APISO): 66 U/L (ref 37–153)
BUN: 15 mg/dL (ref 7–25)
CO2: 27 mmol/L (ref 20–32)
Calcium: 9.6 mg/dL (ref 8.6–10.4)
Chloride: 105 mmol/L (ref 98–110)
Creat: 0.73 mg/dL (ref 0.50–1.03)
Globulin: 2.3 g/dL (calc) (ref 1.9–3.7)
Glucose, Bld: 82 mg/dL (ref 65–99)
Potassium: 4 mmol/L (ref 3.5–5.3)
Sodium: 140 mmol/L (ref 135–146)
Total Bilirubin: 0.7 mg/dL (ref 0.2–1.2)
Total Protein: 6.5 g/dL (ref 6.1–8.1)

## 2021-07-03 LAB — LIPID PANEL
Cholesterol: 196 mg/dL (ref <200)
HDL: 65 mg/dL (ref >=50)
LDL Cholesterol (Calc): 115 mg/dL (calc) — ABNORMAL HIGH
Non-HDL Cholesterol (Calc): 131 mg/dL (calc) — ABNORMAL HIGH (ref <130)
Total CHOL/HDL Ratio: 3 (calc) (ref <5.0)
Triglycerides: 67 mg/dL (ref <150)

## 2021-07-03 LAB — TSH: TSH: 3.24 mIU/L

## 2021-07-19 ENCOUNTER — Ambulatory Visit (HOSPITAL_BASED_OUTPATIENT_CLINIC_OR_DEPARTMENT_OTHER): Payer: 59 | Admitting: Family

## 2021-08-03 ENCOUNTER — Other Ambulatory Visit: Payer: Self-pay | Admitting: Family

## 2021-08-04 ENCOUNTER — Other Ambulatory Visit: Payer: Self-pay | Admitting: Obstetrics & Gynecology

## 2021-08-05 NOTE — Telephone Encounter (Signed)
AEX 07/02/21.  TSH 07/02/21  TSH 3.24

## 2021-08-09 ENCOUNTER — Ambulatory Visit (INDEPENDENT_AMBULATORY_CARE_PROVIDER_SITE_OTHER): Payer: 59 | Admitting: Family

## 2021-08-09 ENCOUNTER — Encounter (HOSPITAL_BASED_OUTPATIENT_CLINIC_OR_DEPARTMENT_OTHER): Payer: Self-pay | Admitting: Family

## 2021-08-09 ENCOUNTER — Other Ambulatory Visit: Payer: Self-pay

## 2021-08-09 VITALS — BP 138/86 | HR 60 | Ht 65.0 in | Wt 235.4 lb

## 2021-08-09 DIAGNOSIS — E6609 Other obesity due to excess calories: Secondary | ICD-10-CM

## 2021-08-09 DIAGNOSIS — I77810 Thoracic aortic ectasia: Secondary | ICD-10-CM | POA: Diagnosis not present

## 2021-08-09 DIAGNOSIS — R002 Palpitations: Secondary | ICD-10-CM

## 2021-08-09 DIAGNOSIS — Z6839 Body mass index (BMI) 39.0-39.9, adult: Secondary | ICD-10-CM | POA: Diagnosis not present

## 2021-08-09 LAB — BASIC METABOLIC PANEL
BUN/Creatinine Ratio: 16 (ref 9–23)
BUN: 14 mg/dL (ref 6–24)
CO2: 28 mmol/L (ref 20–29)
Calcium: 10.2 mg/dL (ref 8.7–10.2)
Chloride: 101 mmol/L (ref 96–106)
Creatinine, Ser: 0.88 mg/dL (ref 0.57–1.00)
Glucose: 95 mg/dL (ref 70–99)
Potassium: 4.5 mmol/L (ref 3.5–5.2)
Sodium: 138 mmol/L (ref 134–144)
eGFR: 78 mL/min/{1.73_m2} (ref >59)

## 2021-08-09 MED ORDER — METOPROLOL SUCCINATE ER 25 MG PO TB24: 25.0000 mg | ORAL_TABLET | Freq: Every day | ORAL | 3 refills | Status: DC

## 2021-08-09 NOTE — Progress Notes (Signed)
Office Visit    Patient Name: Erica Hull Date of Encounter: 08/09/2021  PCP:  Kathyrn Lass   [place]  Cardiologist:  [facility] Provider:  Loel Dubonnet, NP Electrophysiologist:  None      Chief Complaint    Erica Hull is a 56 y.o. female with a hx of palpitations, hypothyroidism, dilation ascending aorta presents today for follow-up of palpitations  Past Medical History    Past Medical History:  Diagnosis Date   Elective abortion    X2   H/O peripheral vascular disease 2006   Heart murmur    Hx of cystitis 2006   Hypertension    pregnancy induced   Hypothyroidism    Missed ab    Past Surgical History:  Procedure Laterality Date   CESAREAN SECTION     CESAREAN SECTION W/BTL     DILATION AND CURETTAGE OF UTERUS     ENDOMETRIAL ABLATION     HTA   ENDOSCOPIC VEIN LASER TREATMENT     vein closure procedure Left 2006    Allergies  No Known Allergies  History of Present Illness    Erica Hull is a 56 y.o. female with a hx of palpitations, hypothyroidism, dilation ascending aorta last seen 05/2019.  She previously followed with Truitt Merle, NP.  Cecille Rubin also cared for her father and mother.  She wore a monitor due to palpitations and had an echocardiogram due to a previously reported murmur.  Monitor showed 2 runs of NSVT and metoprolol added with improvement in symptoms.  Her echocardiogram 03/2019 showed normal LVEF 60 to 65%, mild dilation ascending aorta 40 mm.  Subsequent CABG 03/2019 with ascending thoracic aorta maximum measured transverse diameter 3.8 x 3.1 cm, small hiatal hernia.  Given aorta prominent in size according to age plan for repeat study in 2 years.  She was last seen 05/14/19 noting lower extremity edema.  She was encouraged to reduce NSAID use.  Her palpitations are well controlled on her dose of metoprolol.  She saw OB/GYN 07/02/2021 for well woman visit with labs showing  total cholesterol 196, HDL 65, triglycerides 67, LDL 115.  She presents today for follow-up.  Since last seen she tells me that her mom passed away 09/23/2019 due to [place].  Offered my condolences.  She lives with her husband and daughter who is a Equities trader in high school and currently deciding between going to [place], [place], and [employer]. She eats most of her meals at home. No formal exercise routine but hopeful to start. 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  She did miss 2 days of her metoprolol since last seen and noticed palpitations but otherwise has been asymptomatic. Reports no shortness of breath nor dyspnea on exertion. Reports no chest pain, pressure, or tightness. No edema, orthopnea, PND.   EKGs/Labs/Other Studies Reviewed:   The following studies were reviewed today:  Monitor Study Highlights 04/2019   Basic rhythm is NSR Two non sustained SVT episodes, longet 11 beats not identified by patient Triggered events did not correlated with rhythm disturbance. Rare PVC's are noted.    Notes recorded by Belva Crome, MD on 05/03/2019 at 2:02 PM EDT  Let the patient know had brief PSVT and occasional PVC's. Monitor is benign.  A copy will be sent to Princess Bruins, MD    ECHO IMPRESSIONS 03/2019    1. The left ventricle has normal systolic function with an  ejection fraction of 60-65%. The cavity size was normal. Left ventricular diastolic parameters were normal. No evidence of left ventricular regional wall motion abnormalities.  2. The right ventricle has normal systolic function. The cavity was normal. There is no increase in right ventricular wall thickness. Right ventricular systolic pressure could not be assessed.  3. Trivial pericardial effusion is present.  4. No evidence of mitral valve stenosis.  5. The aortic valve is tricuspid. No stenosis of the aortic valve.  6. The aorta is normal unless otherwise noted.  7. There is mild dilatation of the ascending aorta  measuring 40 mm.   CTA CHEST 04/2019 IMPRESSION: 1. The ascending thoracic aorta has a maximum measured transverse diameter 3.8 x 3.8 cm. No thoracic aortic dissection.   2.  No evident pulmonary embolus.   3. Reflux of contrast into the inferior vena cava and hepatic veins may be indicative of a degree of increased right heart pressure.   4.  No lung edema or consolidation.   5.  Small hiatal hernia.   6.  No evident thoracic adenopathy.    Electronically Signed   By: Lowella Grip III M.D.  Notes recorded by Burtis Junes, NP on 04/24/2019 at 9:18 AM EDT  Ok to report. The CT shows that her aorta is prominent in size according to her age.  We will need to follow this going forward.  Would plan to repeat study in 2 years.  Keep good BP control.  EKG:  EKG is ordered today.  The ekg ordered today demonstrates normal sinus rhythm 60 bpm with no acute ST/T wave changes  Recent Labs: 07/02/2021: ALT 17; BUN 15; Creat 0.73; Hemoglobin 12.6; Platelets 207; Potassium 4.0; Sodium 140; TSH 3.24  Recent Lipid Panel    Component Value Date/Time   CHOL 196 07/02/2021 1023   TRIG 67 07/02/2021 1023   HDL 65 07/02/2021 1023   CHOLHDL 3.0 07/02/2021 1023   VLDL 11 12/12/2016 0904   LDLCALC 115 (H) 07/02/2021 1023    Home Medications   Current Meds  Medication Sig   B Complex-C-Folic Acid (SUPER B COMPLEX/FA/VIT C PO) Take 1 tablet by mouth daily.   Cholecalciferol (D3-1000) 25 MCG (1000 UT) tablet    Cyanocobalamin (VITAMIN B 12 PO) Take 1,000 mcg by mouth daily.   Ginkgo Biloba 120 MG CAPS Take 1 tablet by mouth daily.   levothyroxine (SYNTHROID) 88 MCG tablet TAKE 1 TABLET BY MOUTH DAILY   Multiple Vitamins-Minerals (MULTIVITAMIN WITH MINERALS) tablet Take 1 tablet by mouth daily.   Omega-3 Fatty Acids (FISH OIL) 1200 MG CAPS Take 1 capsule by mouth daily.   omeprazole (PRILOSEC) 20 MG capsule Take 20 mg by mouth as needed (indigestion).   Rhubarb (ESTROVEN COMPLETE  PO) Take 1 capsule by mouth daily.   TURMERIC CURCUMIN PO Take 2,250 mg by mouth daily.   vitamin E 400 UNIT capsule Take 400 Units by mouth daily.   [DISCONTINUED] metoprolol succinate (TOPROL-XL) 25 MG 24 hr tablet Take 1 tablet (25 mg total) by mouth daily. Please keep upcoming appt in January 2023 with Cardiologist before anymore refills. Thank you Final Attempt     Review of Systems      All other systems reviewed and are otherwise negative except as noted above.  Physical Exam    VS:  BP 138/86 (BP Location: Left Arm, Patient Position: Sitting, Cuff Size: Large)    Pulse 60    Ht 5\' 5"  (1.651 m)  Wt 235 lb 6.4 oz (106.8 kg)    BMI 39.17 kg/m  , BMI Body mass index is 39.17 kg/m.  Wt Readings from Last 3 Encounters:  08/09/21 235 lb 6.4 oz (106.8 kg)  07/02/21 232 lb (105.2 kg)  04/06/20 230 lb (104.3 kg)     GEN: Well nourished, overweight, well developed, in no acute distress. HEENT: normal. Neck: Supple, no JVD, carotid bruits, or masses. Cardiac: RRR, no murmurs, rubs, or gallops. No clubbing, cyanosis, edema.  Radials/PT 2+ and equal bilaterally.  Respiratory:  Respirations regular and unlabored, clear to auscultation bilaterally. GI: Soft, nontender, nondistended. MS: No deformity or atrophy. Skin: Warm and dry, no rash. Neuro:  Strength and sensation are intact. Psych: Normal affect.  Assessment & Plan    Palpitations - Well-controlled on Toprol 25 mg daily.  Refill provided.  Prominent aorta / Ascending aorta dilation -Echo 03/2019 40 mm.  CT 03/2019 transverse diameter 3.8X 3.8 cm.  High normal for age.  Plan for repeat CT for monitoring, ordered today. Continue optimal BP control.  HTN - Previous hypertension in setting of pregnancy. BP mildly elevated today. Encouraged home monitoring and to report BP consistently >130/80. If noted, consider addition of HCTZ or low dose ARB.  Obesity - Weight loss via diet and exercise encouraged. Discussed the impact being  overweight would have on cardiovascular risk. Referred to PREP program at [facility]. GLP1 could also be considered in the future.   Disposition: Follow up in 1 year(s) with Loel Dubonnet, NP   Signed, Loel Dubonnet, NP 08/09/2021, 4:24 PM [place]

## 2021-08-09 NOTE — Patient Instructions (Addendum)
Medication Instructions:  Continue your current medications.   *If you need a refill on your cardiac medications before your next appointment, please call your pharmacy*   Lab Work: None ordered today.   Testing/Procedures: Your EKG today showed normal sinus rhythm. This is a good result!  Recommend CT Angio Chest for monitoring of your aorta.   Follow-Up: At [facility], you and your health needs are our priority.  As part of our continuing mission to provide you with exceptional heart care, we have created designated Provider Care Teams.  These Care Teams include your primary Cardiologist (physician) and Advanced Practice Providers (APPs -  Physician Assistants and Nurse Practitioners) who all work together to provide you with the care you need, when you need it.  We recommend signing up for the patient portal called "MyChart".  Sign up information is provided on this After Visit Summary.  MyChart is used to connect with patients for Virtual Visits (Telemedicine).  Patients are able to view lab/test results, encounter notes, upcoming appointments, etc.  Non-urgent messages can be sent to your provider as well.   To learn more about what you can do with MyChart, go to [email or web address].    Your next appointment:   1 year(s)  The format for your next appointment:   In Person  Provider:   Laurann Montana, NP    Other Instructions  Recommend establishing with primary care provider. Jacolyn Reedy, NP is a great provider in the [place] building. You can call [phone] to schedule.   Heart Healthy Diet Recommendations: A low-salt diet is recommended. Meats should be grilled, baked, or boiled. Avoid fried foods. Focus on lean protein sources like fish or chicken with vegetables and fruits. The American Heart Association is a Microbiologist!  American Heart Association Diet and Lifeystyle Recommendations    Exercise recommendations: The American Heart Association  recommends 150 minutes of moderate intensity exercise weekly. Try 30 minutes of moderate intensity exercise 4-5 times per week. This could include walking, jogging, or swimming.  If you are interested in counseling services recommend looking into: [facility], [facility] [facility], or Dr. Elias Else at [facility]. Please let Loel Dubonnet, NP know if you would like a referral to Dr. Elias Else.

## 2021-08-13 ENCOUNTER — Telehealth: Payer: Self-pay

## 2021-08-13 NOTE — Telephone Encounter (Signed)
Call to patient reference PREP referral  Explained program to pt Lives closest to [place], unable to do an afternoon class. Needs a night time class.  Offered [facility] as a location for evening but would prefer to be in [place] instead.  Advised do not currently have that time available (only afternoon class at [facility]) but will contact her when that changes

## 2021-08-20 ENCOUNTER — Ambulatory Visit (HOSPITAL_BASED_OUTPATIENT_CLINIC_OR_DEPARTMENT_OTHER)
Admission: RE | Admit: 2021-08-20 | Discharge: 2021-08-20 | Disposition: A | Discharge status: Home / Self Care | Payer: 59 | Source: Ambulatory Visit | Attending: Family | Admitting: Family

## 2021-08-20 ENCOUNTER — Other Ambulatory Visit: Payer: Self-pay

## 2021-08-20 DIAGNOSIS — I77810 Thoracic aortic ectasia: Secondary | ICD-10-CM | POA: Insufficient documentation

## 2021-08-20 MED ORDER — IOHEXOL 350 MG/ML SOLN
100.0000 mL | Freq: Once | INTRAVENOUS | Status: AC | PRN
  Administered 2021-08-20: 100 mL via INTRAVENOUS

## 2021-10-29 ENCOUNTER — Ambulatory Visit (INDEPENDENT_AMBULATORY_CARE_PROVIDER_SITE_OTHER): Payer: 59 | Admitting: Nurse Practitioner

## 2021-10-29 ENCOUNTER — Encounter (HOSPITAL_BASED_OUTPATIENT_CLINIC_OR_DEPARTMENT_OTHER): Payer: Self-pay | Admitting: Nurse Practitioner

## 2021-10-29 VITALS — BP 128/88 | HR 86 | Ht 67.0 in | Wt 234.0 lb

## 2021-10-29 DIAGNOSIS — Z6836 Body mass index (BMI) 36.0-36.9, adult: Secondary | ICD-10-CM | POA: Diagnosis not present

## 2021-10-29 DIAGNOSIS — E038 Other specified hypothyroidism: Secondary | ICD-10-CM

## 2021-10-29 LAB — HEMOGLOBIN A1C
Est. average glucose Bld gHb Est-mCnc: 126 mg/dL
Hgb A1c MFr Bld: 6 % — ABNORMAL HIGH (ref 4.8–5.6)

## 2021-10-29 NOTE — Patient Instructions (Signed)
Thank you for choosing [facility] for your Primary Care needs. I am excited for the opportunity to partner with you to meet your health care goals. It was a pleasure meeting you today! ? ?Recommendations from today's visit: ?I want to check your A1c today and then we will see what medications that we can try to get covered.  ?I will be in touch with you as soon as we have the results back and that will give [place] a good idea of what medications we can ask the insurance company to cover.  ? ?Information on diet, exercise, and health maintenance recommendations are listed below. This is information to help you be sure you are on track for optimal health and monitoring.  ? ?Please look over this and let us know if you have any questions or if you have completed any of the health maintenance outside of [facility] so that we can be sure your records are up to date.  ?___________________________________________________________ ?About Me: ?I am an Adult-Geriatric Nurse Practitioner with a background in caring for patients for more than 20 years with a strong intensive care background. I provide primary care and sports medicine services to patients age 21 and older within this office. My education had a strong focus on caring for the older adult population, which I am passionate about. I am also the director of the APP Fellowship with [facility].  ? ?My desire is to provide you with the best service through preventive medicine and supportive care. I consider you a part of the medical team and value your input. I work diligently to ensure that you are heard and your needs are met in a safe and effective manner. I want you to feel comfortable with me as your provider and want you to know that your health concerns are important to me. ? ?For your information, our office hours are: ?Monday, Tuesday, and Thursday 8:00 AM - 5:00 PM ?Wednesday and Friday 8:00 AM - 12:00 PM.  ? ?In my time away  from the office I am teaching new APP's within the system and am unavailable, but my partner, Dr. Burnard Bunting is in the office for emergent needs.  ? ?If you have questions or concerns, please call our office at [phone] or send [place] a MyChart message and we will respond as quickly as possible.  ?____________________________________________________________ ?MyChart:  ?For all urgent or time sensitive needs we ask that you please call the office to avoid delays. Our number is [phone]. ?MyChart is not constantly monitored and due to the large volume of messages a day, replies may take up to 72 business hours. ? ?MyChart Policy: ?MyChart allows for you to see your visit notes, after visit summary, provider recommendations, lab and tests results, make an appointment, request refills, and contact your provider or the office for non-urgent questions or concerns. Providers are seeing patients during normal business hours and do not have built in time to review MyChart messages.  ?We ask that you allow a minimum of 3 business days for responses to [employer]. For this reason, please do not send urgent requests through [place]. Please call the office at [phone]. ?New and ongoing conditions may require a visit. We have virtual and in person visit available for your convenience.  ?Complex MyChart concerns may require a visit. Your provider may request you schedule a virtual or in person visit to ensure we are providing the best care possible. ?MyChart messages sent after 11:00  AM on Friday will not be received by the provider until Monday morning.  ?  ?Lab and Test Results: ?You will receive your lab and test results on MyChart as soon as they are completed and results have been sent by the lab or testing facility. Due to this service, you will receive your results BEFORE your provider.  ?I review lab and tests results each morning prior to seeing patients. Some results require collaboration with other providers  to ensure you are receiving the most appropriate care. For this reason, we ask that you please allow a minimum of 3-5 business days from the time the ALL results have been received for your provider to receive and review lab and test results and contact you about these.  ?Most lab and test result comments from the provider will be sent through [place]. Your provider may recommend changes to the plan of care, follow-up visits, repeat testing, ask questions, or request an office visit to discuss these results. You may reply directly to this message or call the office at [phone] to provide information for the provider or set up an appointment. ?In some instances, you will be called with test results and recommendations. Please let us know if this is preferred and we will make note of this in your chart to provide this for you.    ?If you have not heard a response to your lab or test results in 5 business days from all results returning to [place], please call the office to let us know. We ask that you please avoid calling prior to this time unless there is an emergent concern. Due to high call volumes, this can delay the resulting process. ? ?After Hours: ?For all non-emergency after hours needs, please call the office at [phone] and select the option to reach the on-call provider service. On-call services are shared between multiple [facility] offices and therefore it will not be possible to speak directly with your provider. On-call providers may provide medical advice and recommendations, but are unable to provide refills for maintenance medications.  ?For all emergency or urgent medical needs after normal business hours, we recommend that you seek care at the closest Urgent Care or Emergency Department to ensure appropriate treatment in a timely manner.  ?[facility] has a 24 hour emergency room located on the ground floor for your convenience.  ? ?Urgent Concerns During the  Business Day ?Providers are seeing patients from 8AM to [place] with a busy schedule and are most often not able to respond to non-urgent calls until the end of the day or the next business day. ?If you should have URGENT concerns during the day, please call and speak to the nurse or schedule a same day appointment so that we can address your concern without delay.  ? ?Thank you, again, for choosing me as your health care partner. I appreciate your trust and look forward to learning more about you.  ? ?Worthy Keeler, DNP, AGNP-c ?___________________________________________________________ ? ?Health Maintenance Recommendations ?Screening Testing ?Mammogram ?Every 1 -2 years based on history and risk factors ?Starting at age 30 ?Pap Smear ?Ages 21-39 every 3 years ?Ages 33-65 every 5 years with HPV testing ?More frequent testing may be required based on results and history ?Colon Cancer Screening ?Every 1-10 years based on test performed, risk factors, and history ?Starting at age 63 ?Bone Density Screening ?Every 2-10 years based on history ?Starting at age 43 for women ?Recommendations for men differ based on medication  usage, history, and risk factors ?AAA Screening ?One time ultrasound ?Men 43-42 years old who have every smoked ?Lung Cancer Screening ?Low Dose Lung CT every 12 months ?Age 53-80 years with a 30 pack-year smoking history who still smoke or who have quit within the last 15 years ? ?Screening Labs ?Routine  Labs: Complete Blood Count (CBC), Complete Metabolic Panel (CMP), Cholesterol (Lipid Panel) ?Every 6-12 months based on history and medications ?May be recommended more frequently based on current conditions or previous results ?Hemoglobin A1c Lab ?Every 3-12 months based on history and previous results ?Starting at age 21 or earlier with diagnosis of diabetes, high cholesterol, BMI >26, and/or risk factors ?Frequent monitoring for patients with diabetes to ensure blood sugar control ?Thyroid Panel  (TSH w/ T3 & T4) ?Every 6 months based on history, symptoms, and risk factors ?May be repeated more often if on medication ?HIV ?One time testing for all patients 18 and older ?May be repeated more frequently

## 2021-10-29 NOTE — Progress Notes (Signed)
? ?New Patient Office Visit ? ?Subjective   ? ?Patient ID: Erica Hull, female    DOB: 20-Feb-1966  Age: 56 y.o. MRN: [id number] ? ?CC:  ?Chief Complaint  ?[name] with  ? Establish Care  ? Weight Management Screening  ? ? ?HPI ?Erica Hull to establish care ?She would like to discuss weight loss options. ?She has had recent elevations in blood pressure- taking metoprolol ?She also has dilation of ascending aorta- she sees cardiology for that ?Elevations in cholesterol levels - not needing statins ?Family hx of CHF in mother, Afib in father, and both had HTN ?Hypothyroidism - managed on levothyroxine ?Has tried weight watchers several times in the past ?Has recently heard about Wegovy and is interested in discussing this today. ?Venous reflux of lower extremities with mild edema ? ?No shortness of breath, chest pain, dizziness ?Intermittent palpitations- seeing cardiology ? ? ?Outpatient Encounter Medications as of 10/29/2021  ?Medication Sig  ? B Complex-C-Folic Acid (SUPER B COMPLEX/FA/VIT C PO) Take 1 tablet by mouth daily.  ? Cholecalciferol (D3-1000) 25 MCG (1000 UT) tablet   ? Cyanocobalamin (VITAMIN B 12 PO) Take 1,000 mcg by mouth daily.  ? Ginkgo Biloba 120 MG CAPS Take 1 tablet by mouth daily.  ? levothyroxine (SYNTHROID) 88 MCG tablet TAKE 1 TABLET BY MOUTH DAILY  ? metoprolol succinate (TOPROL-XL) 25 MG 24 hr tablet Take 1 tablet (25 mg total) by mouth daily. Please keep upcoming appt in January 2023 with Cardiologist before anymore refills. Thank you Final Attempt  ? Multiple Vitamins-Minerals (MULTIVITAMIN WITH MINERALS) tablet Take 1 tablet by mouth daily.  ? Omega-3 Fatty Acids (FISH OIL) 1200 MG CAPS Take 1 capsule by mouth daily.  ? omeprazole (PRILOSEC) 20 MG capsule Take 20 mg by mouth as needed (indigestion).  ? Rhubarb (ESTROVEN COMPLETE PO) Take 1 capsule by mouth daily.  ? TURMERIC CURCUMIN PO Take 2,250 mg by mouth daily.  ? vitamin E 400 UNIT capsule Take 400 Units  by mouth daily.  ? ?No facility-administered encounter medications on file as of 10/29/2021.  ? ? ?Past Medical History:  ?Diagnosis Date  ? Elective abortion   ? X2  ? H/O peripheral vascular disease 2006  ? Heart murmur   ? Hx of cystitis 2006  ? Hypertension   ? pregnancy induced  ? Hypothyroidism   ? Missed ab   ? ? ?Past Surgical History:  ?Procedure Laterality Date  ? CESAREAN SECTION    ? CESAREAN SECTION W/BTL    ? DILATION AND CURETTAGE OF UTERUS    ? ENDOMETRIAL ABLATION    ? HTA  ? ENDOSCOPIC VEIN LASER TREATMENT    ? vein closure procedure Left 2006  ? ? ?Family History  ?Problem Relation Age of Onset  ? Hypertension Father   ? Cancer Father 76  ?     ureter  ? Atrial fibrillation Father   ? Colon polyps Father   ? Rheum arthritis Mother   ? COPD Mother   ? Dementia Mother   ? Colon cancer Neg Hx   ? Esophageal cancer Neg Hx   ? Rectal cancer Neg Hx   ? Stomach cancer Neg Hx   ? ? ?Social History  ? ?Socioeconomic History  ? Marital status: Married  ?  Spouse name: Not on file  ? Number of children: Not on file  ? Years of education: Not on file  ? Highest education level: Not on file  ?Occupational History  ?  Not on file  ?Tobacco Use  ? Smoking status: Never  ? Smokeless tobacco: Never  ?Vaping Use  ? Vaping Use: Never used  ?Substance and Sexual Activity  ? Alcohol use: Yes  ?  Alcohol/week: 0.0 standard drinks  ?  Comment: rare  ? Drug use: No  ? Sexual activity: Yes  ?  Partners: Male  ?  Birth control/protection: Surgical  ?  Comment: 1ST Intercourse- 58, partners- 10, married- 46 yrs   ?Other Topics Concern  ? Not on file  ?Social History Narrative  ? Not on file  ? ?Social Determinants of Health  ? ?Financial Resource Strain: Not on file  ?Food Insecurity: Not on file  ?Transportation Needs: Not on file  ?Physical Activity: Not on file  ?Stress: Not on file  ?Social Connections: Not on file  ?Intimate Partner Violence: Not on file  ? ? ?ROS ?All review of systems negative except what is listed in  the HPI ? ?Objective   ? ?BP 128/88   Pulse 86   Ht '5\' 7"'$  (1.702 m)   Wt 234 lb (106.1 kg)   SpO2 97%   BMI 36.65 kg/m?  ? ?Physical Exam ?Vitals and nursing note reviewed.  ?Constitutional:   ?   General: She is not in acute distress. ?   Appearance: Normal appearance. She is obese.  ?Eyes:  ?   Extraocular Movements: Extraocular movements intact.  ?   Conjunctiva/sclera: Conjunctivae normal.  ?   Pupils: Pupils are equal, round, and reactive to light.  ?Neck:  ?   Vascular: No carotid bruit.  ?Cardiovascular:  ?   Rate and Rhythm: Normal rate and regular rhythm.  ?   Pulses: Normal pulses.  ?   Heart sounds: Normal heart sounds. No murmur heard. ?Pulmonary:  ?   Effort: Pulmonary effort is normal.  ?   Breath sounds: Normal breath sounds. No wheezing.  ?Abdominal:  ?   General: Bowel sounds are normal.  ?   Palpations: Abdomen is soft.  ?Musculoskeletal:     ?   General: Normal range of motion.  ?   Cervical back: Normal range of motion.  ?   Right lower leg: No edema.  ?   Left lower leg: No edema.  ?Skin: ?   General: Skin is warm and dry.  ?   Capillary Refill: Capillary refill takes less than 2 seconds.  ?Neurological:  ?   General: No focal deficit present.  ?   Mental Status: She is alert and oriented to person, place, and time.  ?Psychiatric:     ?   Mood and Affect: Mood normal.     ?   Behavior: Behavior normal.     ?   Thought Content: Thought content normal.     ?   Judgment: Judgment normal.  ? ? ? ?Assessment & Plan:  ? ?Problem List Items Addressed This Visit   ? ? Hypothyroidism  ?  Chronic.  Currently managed with levothyroxine 88 mcg daily.  Concerns with increased BMI and difficulty losing weight discussed today.  Most recent labs in chart from December 2022.  Historically stable with good control at this level.  Recheck deferred today however patient made aware that we will be happy to help monitor this for her. ? ? ?  ?  ? BMI 36.0-36.9,adult - Primary  ?  Elevation in BMI with difficulty  losing weight despite diet and exercise changes.  Patient is interested in medication options today  that may be helpful for the these efforts.  Answered patient's questions about Lovena Neighbours in the office today with discussion of mechanism of action and efficacy of the medications.  In order to get approval for medication we will need to obtain a hemoglobin A1c today to determine her eligibility.  I do feel that any of these medication options would be beneficial for her and help with her chronic weight concerns. ?We will plan to obtain labs today make changes to the plan of care based on findings. ? ?  ?  ? Relevant Orders  ? Hemoglobin A1c (Completed)  ? ? ?Return for TBD based on labs.  ? ?Orma Render, NP ? ? ?

## 2021-11-01 ENCOUNTER — Encounter (HOSPITAL_BASED_OUTPATIENT_CLINIC_OR_DEPARTMENT_OTHER): Payer: Self-pay | Admitting: Nurse Practitioner

## 2021-11-03 ENCOUNTER — Other Ambulatory Visit (HOSPITAL_BASED_OUTPATIENT_CLINIC_OR_DEPARTMENT_OTHER): Payer: Self-pay | Admitting: Nurse Practitioner

## 2021-11-10 ENCOUNTER — Encounter (HOSPITAL_BASED_OUTPATIENT_CLINIC_OR_DEPARTMENT_OTHER): Payer: Self-pay | Admitting: Nurse Practitioner

## 2021-11-11 ENCOUNTER — Telehealth (HOSPITAL_BASED_OUTPATIENT_CLINIC_OR_DEPARTMENT_OTHER): Payer: Self-pay | Admitting: Nurse Practitioner

## 2021-11-11 ENCOUNTER — Other Ambulatory Visit (HOSPITAL_BASED_OUTPATIENT_CLINIC_OR_DEPARTMENT_OTHER): Payer: Self-pay | Admitting: Nurse Practitioner

## 2021-11-11 DIAGNOSIS — Z6836 Body mass index (BMI) 36.0-36.9, adult: Secondary | ICD-10-CM

## 2021-11-11 DIAGNOSIS — E782 Mixed hyperlipidemia: Secondary | ICD-10-CM

## 2021-11-11 MED ORDER — SEMAGLUTIDE-WEIGHT MANAGEMENT 0.5 MG/0.5ML ~~LOC~~ SOAJ: 0.5000 mg | SUBCUTANEOUS | 1 refills | Status: DC

## 2021-11-11 MED ORDER — SEMAGLUTIDE-WEIGHT MANAGEMENT 0.25 MG/0.5ML ~~LOC~~ SOAJ: 0.2500 mg | SUBCUTANEOUS | 0 refills | Status: DC

## 2021-11-11 NOTE — Telephone Encounter (Signed)
Left vm to schedule virtual follow-up with her PCP. ?

## 2021-11-12 ENCOUNTER — Other Ambulatory Visit (HOSPITAL_BASED_OUTPATIENT_CLINIC_OR_DEPARTMENT_OTHER): Payer: Self-pay | Admitting: Nurse Practitioner

## 2021-11-12 ENCOUNTER — Other Ambulatory Visit (HOSPITAL_BASED_OUTPATIENT_CLINIC_OR_DEPARTMENT_OTHER): Payer: Self-pay

## 2021-11-12 DIAGNOSIS — Z6836 Body mass index (BMI) 36.0-36.9, adult: Secondary | ICD-10-CM | POA: Insufficient documentation

## 2021-11-12 DIAGNOSIS — E782 Mixed hyperlipidemia: Secondary | ICD-10-CM

## 2021-11-12 MED ORDER — SEMAGLUTIDE-WEIGHT MANAGEMENT 0.5 MG/0.5ML ~~LOC~~ SOAJ
0.5000 mg | SUBCUTANEOUS | 1 refills | Status: DC
  Filled 2021-11-12: qty 2, 28d supply, fill #0
  Filled 2021-12-17: qty 2, 28d supply, fill #1

## 2021-11-12 MED ORDER — SEMAGLUTIDE-WEIGHT MANAGEMENT 0.25 MG/0.5ML ~~LOC~~ SOAJ
0.2500 mg | SUBCUTANEOUS | 0 refills | Status: AC
  Filled 2021-11-12: qty 2, 28d supply, fill #0

## 2021-11-12 NOTE — Assessment & Plan Note (Signed)
Chronic.  Currently managed with levothyroxine 88 mcg daily.  Concerns with increased BMI and difficulty losing weight discussed today.  Most recent labs in chart from December 2022.  Historically stable with good control at this level.  Recheck deferred today however patient made aware that we will be happy to help monitor this for her. ? ?

## 2021-11-12 NOTE — Assessment & Plan Note (Signed)
Elevation in BMI with difficulty losing weight despite diet and exercise changes.  Patient is interested in medication options today that may be helpful for the these efforts.  Answered patient's questions about Erica Hull in the office today with discussion of mechanism of action and efficacy of the medications.  In order to get approval for medication we will need to obtain a hemoglobin A1c today to determine her eligibility.  I do feel that any of these medication options would be beneficial for her and help with her chronic weight concerns. ?We will plan to obtain labs today make changes to the plan of care based on findings. ?

## 2021-11-16 ENCOUNTER — Telehealth: Payer: Self-pay

## 2021-11-16 NOTE — Telephone Encounter (Signed)
Call to pt, left message reference next PREP at [facility] starting on 11/23/21, Previously needed a nighttime class.  ?

## 2021-12-17 ENCOUNTER — Other Ambulatory Visit (HOSPITAL_BASED_OUTPATIENT_CLINIC_OR_DEPARTMENT_OTHER): Payer: Self-pay

## 2021-12-17 ENCOUNTER — Encounter (HOSPITAL_BASED_OUTPATIENT_CLINIC_OR_DEPARTMENT_OTHER): Payer: Self-pay

## 2021-12-20 ENCOUNTER — Other Ambulatory Visit (HOSPITAL_BASED_OUTPATIENT_CLINIC_OR_DEPARTMENT_OTHER): Payer: Self-pay

## 2021-12-24 ENCOUNTER — Other Ambulatory Visit (HOSPITAL_BASED_OUTPATIENT_CLINIC_OR_DEPARTMENT_OTHER): Payer: Self-pay

## 2021-12-27 ENCOUNTER — Other Ambulatory Visit (HOSPITAL_BASED_OUTPATIENT_CLINIC_OR_DEPARTMENT_OTHER): Payer: Self-pay

## 2021-12-29 ENCOUNTER — Other Ambulatory Visit (HOSPITAL_BASED_OUTPATIENT_CLINIC_OR_DEPARTMENT_OTHER): Payer: Self-pay

## 2022-01-25 ENCOUNTER — Encounter (HOSPITAL_BASED_OUTPATIENT_CLINIC_OR_DEPARTMENT_OTHER): Payer: Self-pay | Admitting: Pharmacist

## 2022-01-25 ENCOUNTER — Encounter (HOSPITAL_BASED_OUTPATIENT_CLINIC_OR_DEPARTMENT_OTHER): Payer: Self-pay | Admitting: Nurse Practitioner

## 2022-01-25 ENCOUNTER — Other Ambulatory Visit (HOSPITAL_BASED_OUTPATIENT_CLINIC_OR_DEPARTMENT_OTHER): Payer: Self-pay | Admitting: Nurse Practitioner

## 2022-01-25 ENCOUNTER — Other Ambulatory Visit (HOSPITAL_BASED_OUTPATIENT_CLINIC_OR_DEPARTMENT_OTHER): Payer: Self-pay

## 2022-01-25 ENCOUNTER — Ambulatory Visit (INDEPENDENT_AMBULATORY_CARE_PROVIDER_SITE_OTHER): Payer: 59 | Admitting: Nurse Practitioner

## 2022-01-25 DIAGNOSIS — E782 Mixed hyperlipidemia: Secondary | ICD-10-CM | POA: Diagnosis not present

## 2022-01-25 DIAGNOSIS — R110 Nausea: Secondary | ICD-10-CM | POA: Diagnosis not present

## 2022-01-25 DIAGNOSIS — Z6836 Body mass index (BMI) 36.0-36.9, adult: Secondary | ICD-10-CM

## 2022-01-25 MED ORDER — WEGOVY 1 MG/0.5ML ~~LOC~~ SOAJ
1.0000 mg | SUBCUTANEOUS | 1 refills | Status: DC
  Filled 2022-01-25 – 2022-01-26 (×2): qty 2, 28d supply, fill #0
  Filled 2022-02-11 – 2022-02-16 (×2): qty 2, 28d supply, fill #1

## 2022-01-25 MED ORDER — SEMAGLUTIDE (1 MG/DOSE) 4 MG/3ML ~~LOC~~ SOPN
1.0000 mg | PEN_INJECTOR | SUBCUTANEOUS | 3 refills | Status: DC
  Filled 2022-01-25: qty 3, 28d supply, fill #0

## 2022-01-25 MED ORDER — ONDANSETRON HCL 8 MG PO TABS
8.0000 mg | ORAL_TABLET | Freq: Three times a day (TID) | ORAL | 3 refills | Status: AC | PRN
  Filled 2022-01-25: qty 20, 7d supply, fill #0

## 2022-01-25 NOTE — Assessment & Plan Note (Signed)
Tolerating medication well with mild intermittent nausea initially on 0.5 mg doses resolved.  Appetite has recently started to return therefore we will plan to increase to the 1 mg dose for improved functioning. Discussed with the patient the possibility that the side effect of nausea may return after increased dose.  We will send in as needed Zofran so that she has this available in the event this happens.  Discussed with patient the limitations of obtaining the medication from the pharmacy and my recommendation to call immediately to determine if the 1 mg dosage will be available in the next week or possibly next 2 weeks.  If dosage will be available in 1 week she may double up on her last 0.5 mg dose to go ahead and start the 1 mg.  If it is expected to take longer than 1 week I do recommend continuation of the 0.5 mg dose at this time so that she does not run out of medication. We will plan to follow-up when she is ready for a dosage increase or if she has any concerns or new symptoms.

## 2022-01-25 NOTE — Progress Notes (Signed)
Virtual Visit Encounter telephone visit.   I connected with  Erica Hull on 01/25/22 at  9:50 AM EDT by secure audio telemedicine application. I verified that I am speaking with the correct person using two identifiers.   I introduced myself as a Designer, jewellery with the practice. The limitations of evaluation and management by telemedicine discussed with the patient and the availability of in person appointments. The patient expressed verbal understanding and consent to proceed.  Participating parties in this visit include: Myself and patient  The patient is: Patient Location: Home I am: Provider Location: Office/Clinic Subjective:    CC and HPI: Erica Hull is a 56 y.o. year old female presenting for follow up of weight loss. Patient reports the following: The patient reports that things are going well with [employer]. She experienced intermittent nausea a few days after the initial doses of 0.'5mg'$ , but the side effects were not severe. However, in the past week or two, she has noticed that her appetite is returning, and she is not experiencing any of the previous side effects. Overall, she has lost 7 lbs since starting Wegovy. The patient is pleased with her weight loss progress and prefers a slower pace rather than a faster one. She mentions having had a vacation a few weeks ago during which she ate more than usual, potentially resulting in a temporary increase in weight. The patient is interested in increasing her dose to '1mg'$  if it is possible, and she would like to have Zofran available in case nausea returns.   Past medical history, Surgical history, Family history not pertinant except as noted below, Social history, Allergies, and medications have been entered into the medical record, reviewed, and corrections made.   Review of Systems:  All review of systems negative except what is listed in the HPI  Objective:    Alert and oriented x 4 Speaking in clear sentences with no  shortness of breath. No distress.  Impression and Recommendations:    Problem List Items Addressed This Visit     BMI 36.0-36.9,adult    Tolerating medication well with mild intermittent nausea initially on 0.5 mg doses resolved.  Appetite has recently started to return therefore we will plan to increase to the 1 mg dose for improved functioning. Discussed with the patient the possibility that the side effect of nausea may return after increased dose.  We will send in as needed Zofran so that she has this available in the event this happens.  Discussed with patient the limitations of obtaining the medication from the pharmacy and my recommendation to call immediately to determine if the 1 mg dosage will be available in the next week or possibly next 2 weeks.  If dosage will be available in 1 week she may double up on her last 0.5 mg dose to go ahead and start the 1 mg.  If it is expected to take longer than 1 week I do recommend continuation of the 0.5 mg dose at this time so that she does not run out of medication. We will plan to follow-up when she is ready for a dosage increase or if she has any concerns or new symptoms.      Other Visit Diagnoses     Nausea    -  Primary   Relevant Medications   ondansetron (ZOFRAN) 8 MG tablet   Moderate mixed hyperlipidemia not requiring statin therapy           orders and follow up as  documented in EMR I discussed the assessment and treatment plan with the patient. The patient was provided an opportunity to ask questions and all were answered. The patient agreed with the plan and demonstrated an understanding of the instructions.   The patient was advised to call back or seek an in-person evaluation if the symptoms worsen or if the condition fails to improve as anticipated.  Follow-Up: When ready for dosage increase  I provided 22 minutes of non-face-to-face interaction with this non face-to-face encounter including intake, same-day  documentation, and chart review.   Orma Render, NP , DNP, AGNP-c [facility] [phone] (fax)

## 2022-01-25 NOTE — Patient Instructions (Signed)
I have sent the 1 mg dose into the pharmacy for you.  Please call the pharmacy at your earliest convenience and ask them if they anticipate having the 1 mg dose available within a week or if it will more than likely take 2 weeks.  If they do anticipate having it within a week you may go ahead and take 2 of the 0.5 mg doses at 1 time to equal to 1 mg.  If the pharmacy anticipates that taking longer than 1 week I would recommend continuation of the 0.5 mg that you do not run out of medication.  It is typically okay to have up to a 2-day delay in your dosing but as timeis out farther than that the chances of side effects increase.   I did send in the Zofran for you as well.  Please let me know if you have any questions or concerns or new side effects.  We will plan to follow-up when you are ready for a dosage increase.

## 2022-01-26 ENCOUNTER — Other Ambulatory Visit (HOSPITAL_BASED_OUTPATIENT_CLINIC_OR_DEPARTMENT_OTHER): Payer: Self-pay

## 2022-02-11 ENCOUNTER — Other Ambulatory Visit (HOSPITAL_BASED_OUTPATIENT_CLINIC_OR_DEPARTMENT_OTHER): Payer: Self-pay

## 2022-02-16 ENCOUNTER — Other Ambulatory Visit (HOSPITAL_BASED_OUTPATIENT_CLINIC_OR_DEPARTMENT_OTHER): Payer: Self-pay

## 2022-03-11 ENCOUNTER — Encounter (HOSPITAL_BASED_OUTPATIENT_CLINIC_OR_DEPARTMENT_OTHER): Payer: Self-pay | Admitting: Nurse Practitioner

## 2022-03-11 DIAGNOSIS — Z6836 Body mass index (BMI) 36.0-36.9, adult: Secondary | ICD-10-CM

## 2022-03-11 DIAGNOSIS — E782 Mixed hyperlipidemia: Secondary | ICD-10-CM

## 2022-03-15 MED ORDER — WEGOVY 1.7 MG/0.75ML ~~LOC~~ SOAJ
1.7000 mg | SUBCUTANEOUS | 3 refills | Status: DC
  Filled 2022-03-15: qty 3, 28d supply, fill #0
  Filled 2022-04-08: qty 3, 28d supply, fill #1

## 2022-03-16 ENCOUNTER — Other Ambulatory Visit (HOSPITAL_BASED_OUTPATIENT_CLINIC_OR_DEPARTMENT_OTHER): Payer: Self-pay

## 2022-04-06 ENCOUNTER — Encounter: Payer: Self-pay | Admitting: Obstetrics & Gynecology

## 2022-04-08 ENCOUNTER — Other Ambulatory Visit (HOSPITAL_BASED_OUTPATIENT_CLINIC_OR_DEPARTMENT_OTHER): Payer: Self-pay

## 2022-05-07 ENCOUNTER — Encounter (HOSPITAL_BASED_OUTPATIENT_CLINIC_OR_DEPARTMENT_OTHER): Payer: Self-pay | Admitting: Nurse Practitioner

## 2022-05-07 DIAGNOSIS — Z6836 Body mass index (BMI) 36.0-36.9, adult: Secondary | ICD-10-CM

## 2022-05-07 DIAGNOSIS — E782 Mixed hyperlipidemia: Secondary | ICD-10-CM

## 2022-05-09 ENCOUNTER — Other Ambulatory Visit (HOSPITAL_BASED_OUTPATIENT_CLINIC_OR_DEPARTMENT_OTHER): Payer: Self-pay

## 2022-05-09 MED ORDER — WEGOVY 2.4 MG/0.75ML ~~LOC~~ SOAJ
2.4000 mg | SUBCUTANEOUS | 3 refills | Status: DC
  Filled 2022-05-09: qty 3, 28d supply, fill #0
  Filled 2022-05-27 – 2022-05-30 (×2): qty 3, 28d supply, fill #1
  Filled 2022-06-24: qty 3, 28d supply, fill #2
  Filled 2022-07-22 – 2022-07-29 (×2): qty 3, 28d supply, fill #3

## 2022-05-27 ENCOUNTER — Other Ambulatory Visit (HOSPITAL_BASED_OUTPATIENT_CLINIC_OR_DEPARTMENT_OTHER): Payer: Self-pay

## 2022-05-30 ENCOUNTER — Other Ambulatory Visit (HOSPITAL_BASED_OUTPATIENT_CLINIC_OR_DEPARTMENT_OTHER): Payer: Self-pay

## 2022-06-07 IMAGING — CT CT ANGIO CHEST
4 of 7 series · 17 of 46 positions shown · IV contrast (APPLIED)
Comparison: CTA chest, most recently 04/23/2019

CLINICAL DATA: F/u known aortic aneurysm.

EXAM:
CT ANGIOGRAPHY CHEST WITH CONTRAST
TECHNIQUE: Multidetector CT imaging of the chest was performed using the
standard protocol during bolus administration of intravenous
contrast. Multiplanar CT image reconstructions and MIPs were
obtained to evaluate the vascular anatomy.

[Series 3: routine chest without · axial · non-contrast · 0.82mm/px · z∈[-266,-101]mm · 4 of 55 slices shown]
[im 11/55  lung]
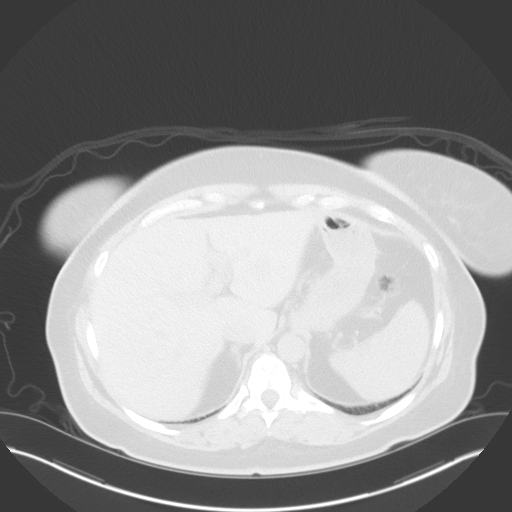
[im 22/55  soft-tissue]
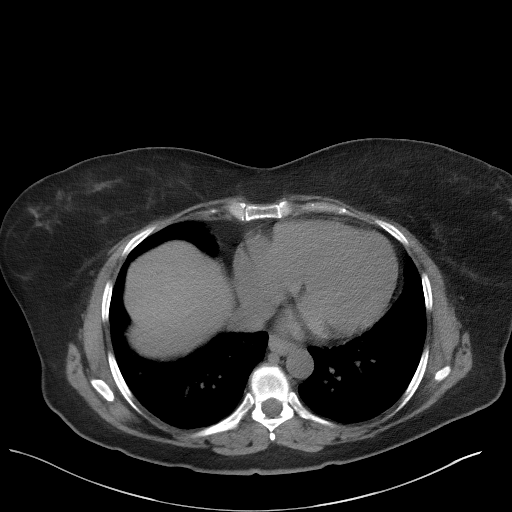
[im 33/55  lung]
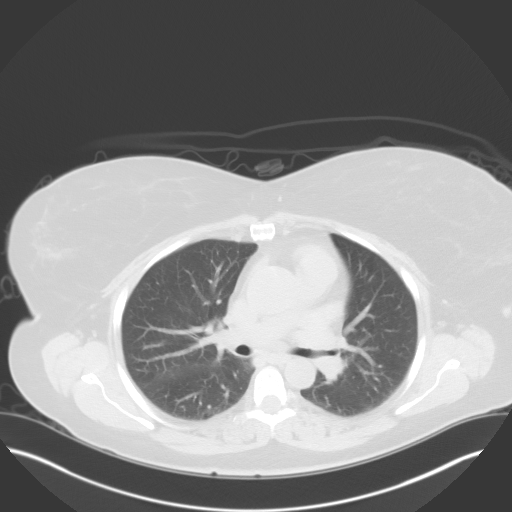
[im 44/55  soft-tissue]
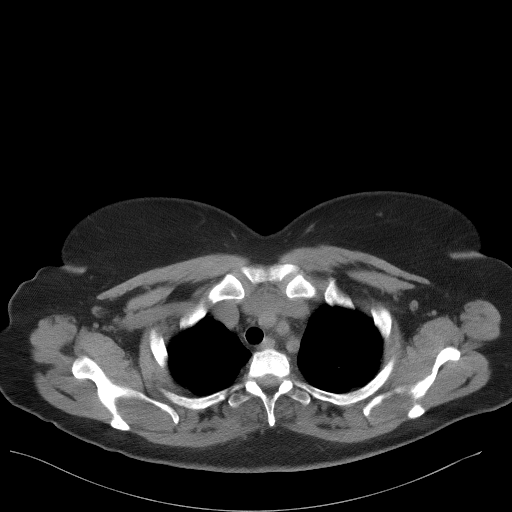

[Series 5: arterial · axial · arterial · 0.91mm/px · z∈[-286,-72]mm · 8 of 127 slices shown]
[im 10/127  lung]
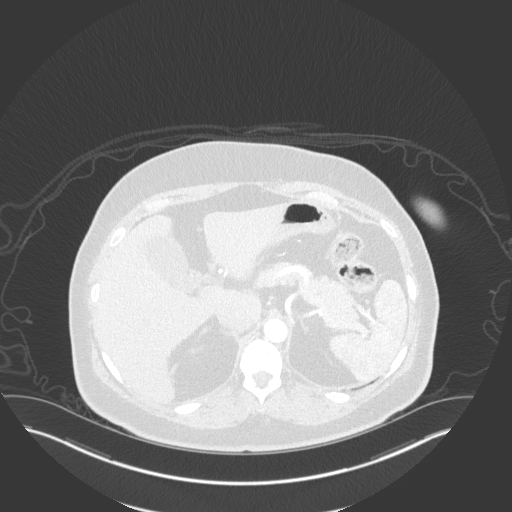
[im 30/127  lung]
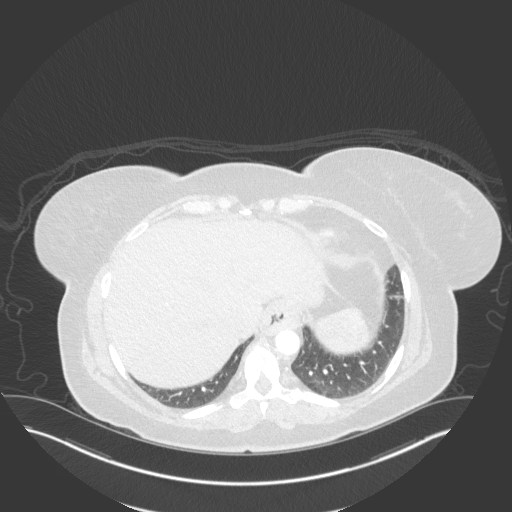
[im 39/127  lung]
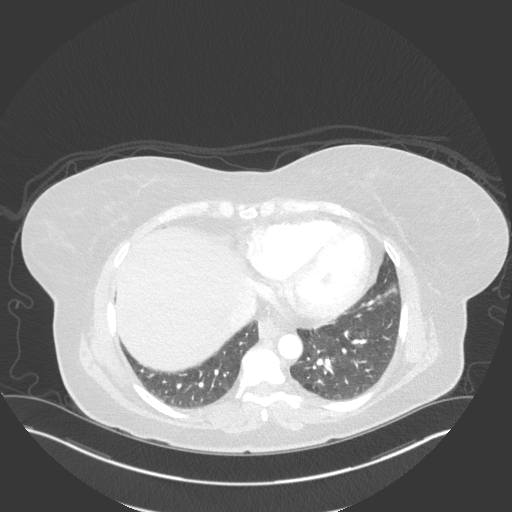
[im 59/127  lung]
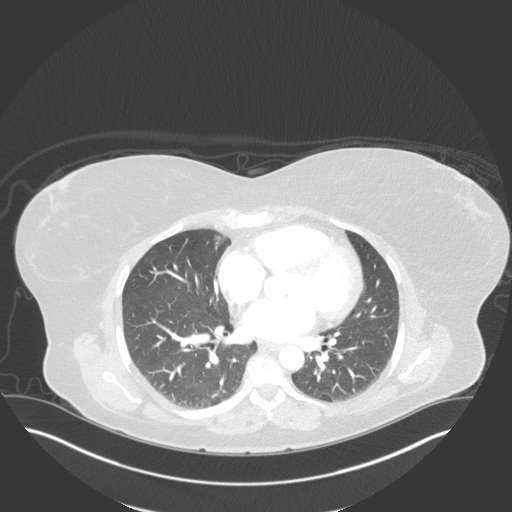
[im 68/127  lung]
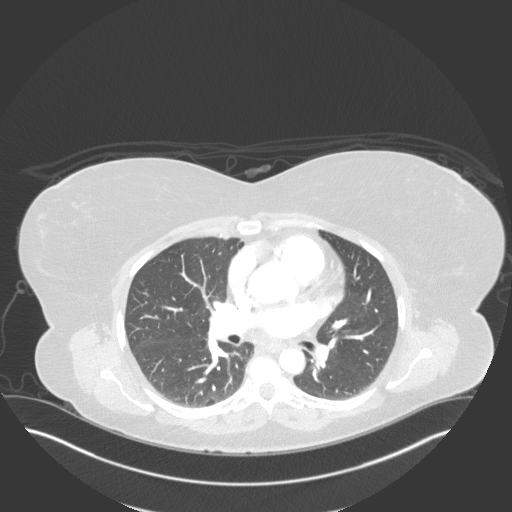
[im 88/127  lung]
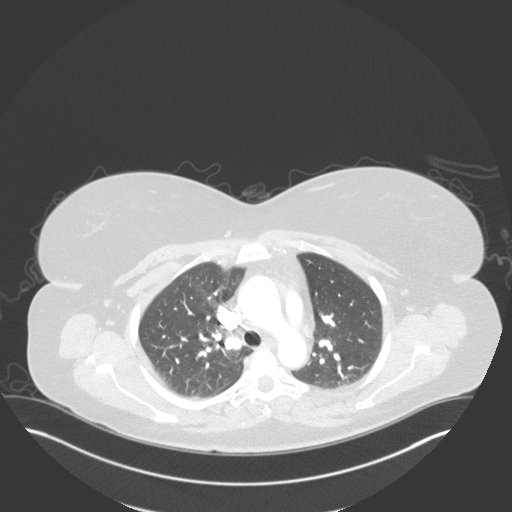
[im 97/127  lung]
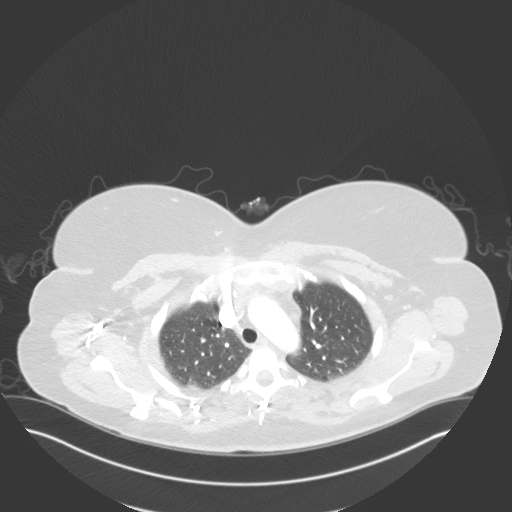
[im 117/127  lung]
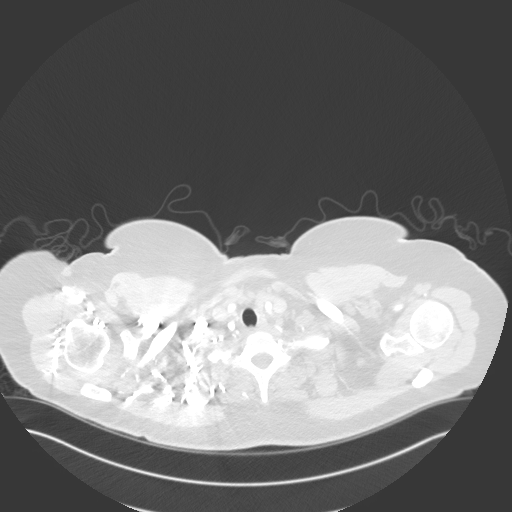

[Series 7: lung · axial · 0.91mm/px · z∈[-262,-242]mm · 2 of 116 slices shown]
[im 11/116  soft-tissue]
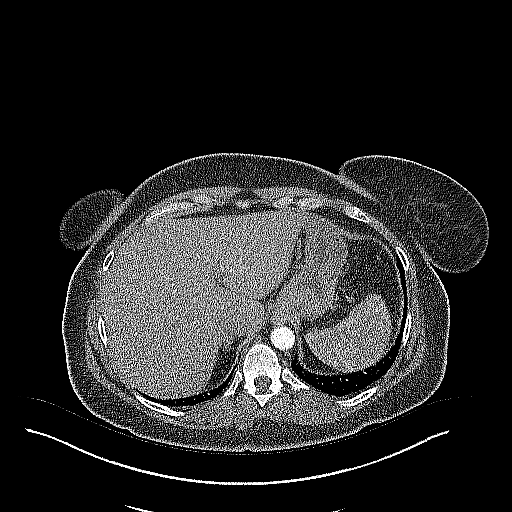
[im 21/116  soft-tissue]
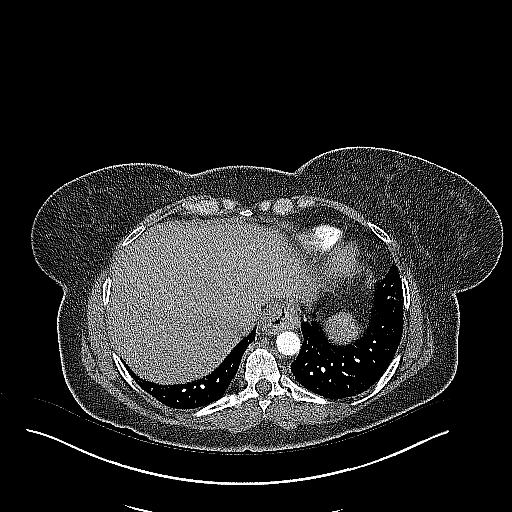

[Series 8: cor soft · coronal · 0.52mm/px · 3 of 136 slices shown]
[im 34/136  soft-tissue]
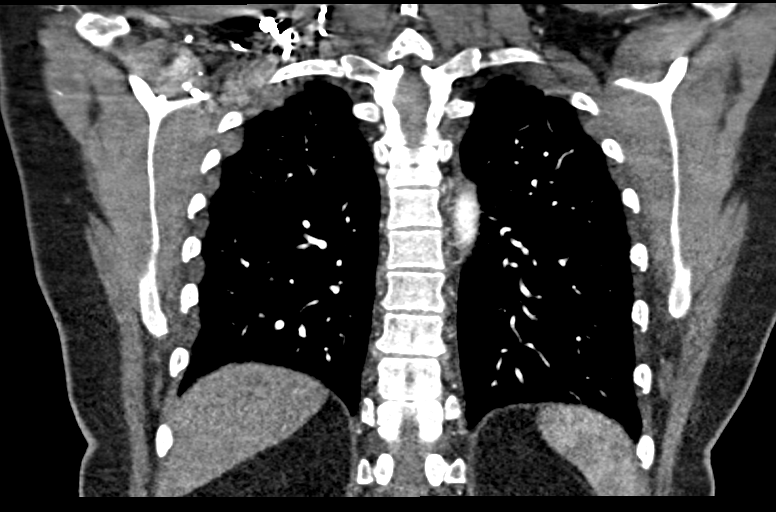
[im 68/136  soft-tissue]
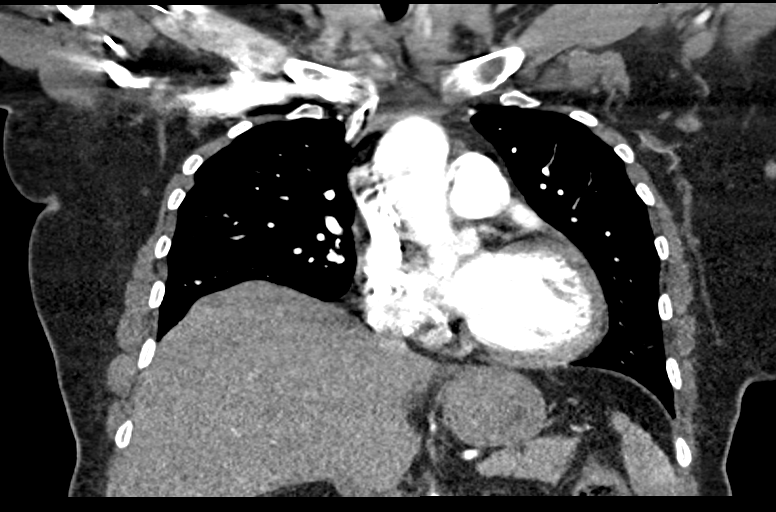
[im 102/136  soft-tissue]
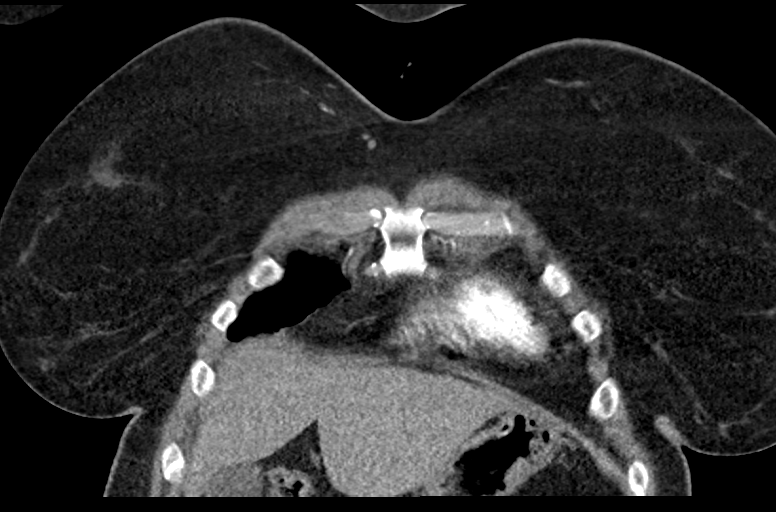

[17 of 46 positions shown; findings below may reference images not displayed]

RADIATION DOSE REDUCTION: This exam was performed according to the
departmental dose-optimization program which includes automated
exposure control, adjustment of the mA and/or kV according to
patient size and/or use of iterative reconstruction technique.

CONTRAST:  100mL OMNIPAQUE IOHEXOL 350 MG/ML SOLN
FINDINGS: CARDIOVASCULAR:

Limitations by motion: Moderate

Aortic Root:

--Valve: 2.6 cm

--Sinuses: 3.6 cm

--Sinotubular Junction: 3.3 cm

Thoracic Aorta:

--Ascending Aorta: 3.8 cm

--Aortic Arch: 3.1 cm

--Descending Aorta: 2.5 cm

Preferential opacification of the thoracic aorta. No evidence of
thoracic aortic aneurysm or dissection.

Other:

Normal heart size.  No pericardial effusion.

Mediastinum/Nodes: No enlarged mediastinal, hilar, or axillary lymph
nodes. Thyroid gland, trachea, and esophagus demonstrate no
significant findings.

Lungs/Pleura: Lungs are clear. No suspicious pulmonary nodule mass.
No pleural effusion or pneumothorax.

Upper Abdomen: No acute abnormality. Small hiatus hernia.
Approximately 1.5 cm LEFT hepatic lobe hemangioma.

Musculoskeletal: No chest wall abnormality. No acute osseous
findings.

Review of the MIP images confirms the above findings.
IMPRESSION: Since CTA chest dated 04/23/2019;

1. No acute intrathoracic findings.
2. Stable 3.8 cm ascending thoracic aorta. Given size and
appearance, no imaging followup is recommended.
Circulation. 8717; 121: [id number]. Aortic aneurysm NOS ([id number])

## 2022-06-25 ENCOUNTER — Other Ambulatory Visit (HOSPITAL_BASED_OUTPATIENT_CLINIC_OR_DEPARTMENT_OTHER): Payer: Self-pay

## 2022-06-27 ENCOUNTER — Other Ambulatory Visit (HOSPITAL_BASED_OUTPATIENT_CLINIC_OR_DEPARTMENT_OTHER): Payer: Self-pay

## 2022-07-09 ENCOUNTER — Other Ambulatory Visit: Payer: Self-pay | Admitting: Obstetrics & Gynecology

## 2022-07-09 ENCOUNTER — Other Ambulatory Visit (HOSPITAL_BASED_OUTPATIENT_CLINIC_OR_DEPARTMENT_OTHER): Payer: Self-pay | Admitting: Family

## 2022-07-09 DIAGNOSIS — R002 Palpitations: Secondary | ICD-10-CM

## 2022-07-12 NOTE — Telephone Encounter (Signed)
Rx request sent to pharmacy.  

## 2022-07-20 ENCOUNTER — Encounter: Payer: Self-pay | Admitting: Internal Medicine

## 2022-07-22 ENCOUNTER — Other Ambulatory Visit (HOSPITAL_BASED_OUTPATIENT_CLINIC_OR_DEPARTMENT_OTHER): Payer: Self-pay

## 2022-07-23 ENCOUNTER — Other Ambulatory Visit (HOSPITAL_BASED_OUTPATIENT_CLINIC_OR_DEPARTMENT_OTHER): Payer: Self-pay

## 2022-07-23 ENCOUNTER — Encounter (HOSPITAL_BASED_OUTPATIENT_CLINIC_OR_DEPARTMENT_OTHER): Payer: Self-pay | Admitting: Pharmacist

## 2022-07-23 ENCOUNTER — Other Ambulatory Visit: Payer: Self-pay

## 2022-07-27 ENCOUNTER — Other Ambulatory Visit (HOSPITAL_BASED_OUTPATIENT_CLINIC_OR_DEPARTMENT_OTHER): Payer: Self-pay

## 2022-07-28 ENCOUNTER — Encounter: Payer: Self-pay | Admitting: Nurse Practitioner

## 2022-07-28 ENCOUNTER — Other Ambulatory Visit: Payer: Self-pay

## 2022-07-29 ENCOUNTER — Telehealth: Payer: Self-pay | Admitting: Nurse Practitioner

## 2022-07-29 NOTE — Telephone Encounter (Signed)
P.A. WEGOVY completed  

## 2022-07-29 NOTE — Telephone Encounter (Signed)
Pt called stating her P.A. ZZCKIC needs renewal authorization. (Same insurance)  Advised no recent follow up appt, she has one scheduled in March.  Advised her will call her if documented recent weight is needed for approval.

## 2022-07-30 ENCOUNTER — Other Ambulatory Visit (HOSPITAL_BASED_OUTPATIENT_CLINIC_OR_DEPARTMENT_OTHER): Payer: Self-pay

## 2022-07-30 NOTE — Telephone Encounter (Signed)
PA completed.

## 2022-08-02 ENCOUNTER — Other Ambulatory Visit (HOSPITAL_BASED_OUTPATIENT_CLINIC_OR_DEPARTMENT_OTHER): Payer: Self-pay

## 2022-08-03 ENCOUNTER — Other Ambulatory Visit (HOSPITAL_BASED_OUTPATIENT_CLINIC_OR_DEPARTMENT_OTHER): Payer: Self-pay

## 2022-08-04 ENCOUNTER — Other Ambulatory Visit (HOSPITAL_BASED_OUTPATIENT_CLINIC_OR_DEPARTMENT_OTHER): Payer: Self-pay

## 2022-08-04 ENCOUNTER — Encounter (HOSPITAL_BASED_OUTPATIENT_CLINIC_OR_DEPARTMENT_OTHER): Payer: Self-pay | Admitting: Pharmacist

## 2022-08-05 ENCOUNTER — Other Ambulatory Visit (HOSPITAL_BASED_OUTPATIENT_CLINIC_OR_DEPARTMENT_OTHER): Payer: Self-pay

## 2022-08-08 ENCOUNTER — Other Ambulatory Visit (HOSPITAL_BASED_OUTPATIENT_CLINIC_OR_DEPARTMENT_OTHER): Payer: Self-pay

## 2022-08-10 NOTE — Telephone Encounter (Signed)
P.A. approved til 11/27/22, pt informed, she said cost is very high.  I called pharmacy and with ins & co pay card cost still $ 688.  Called pt back & informed probably has deductible that has to be met first and she can call insurance to find out exactly why.

## 2022-08-17 ENCOUNTER — Telehealth: Payer: Self-pay | Admitting: Nurse Practitioner

## 2022-08-17 DIAGNOSIS — E782 Mixed hyperlipidemia: Secondary | ICD-10-CM

## 2022-08-17 DIAGNOSIS — Z6836 Body mass index (BMI) 36.0-36.9, adult: Secondary | ICD-10-CM

## 2022-08-17 MED ORDER — WEGOVY 2.4 MG/0.75ML ~~LOC~~ SOAJ: 2.4000 mg | SUBCUTANEOUS | 3 refills | Status: DC

## 2022-08-17 NOTE — Telephone Encounter (Signed)
Pt states going to be little cheaper thru mail order so would like [facility] sent to [facility] for 90 days.  Once she meets her deductible it will be a lot cheaper.

## 2022-08-17 NOTE — Telephone Encounter (Signed)
Prescription sent to [facility]

## 2022-08-18 ENCOUNTER — Other Ambulatory Visit (HOSPITAL_BASED_OUTPATIENT_CLINIC_OR_DEPARTMENT_OTHER): Payer: Self-pay

## 2022-08-18 ENCOUNTER — Other Ambulatory Visit (HOSPITAL_BASED_OUTPATIENT_CLINIC_OR_DEPARTMENT_OTHER): Payer: Self-pay | Admitting: Nurse Practitioner

## 2022-08-18 DIAGNOSIS — E782 Mixed hyperlipidemia: Secondary | ICD-10-CM

## 2022-08-18 DIAGNOSIS — Z6836 Body mass index (BMI) 36.0-36.9, adult: Secondary | ICD-10-CM

## 2022-08-18 MED ORDER — WEGOVY 2.4 MG/0.75ML ~~LOC~~ SOAJ
2.4000 mg | SUBCUTANEOUS | 3 refills | Status: DC
  Filled 2022-08-18: qty 3, 28d supply, fill #0
  Filled 2022-09-02 – 2022-09-08 (×2): qty 3, 28d supply, fill #1
  Filled 2022-10-02: qty 3, 28d supply, fill #2
  Filled 2023-02-03: qty 3, 28d supply, fill #3
  Filled 2023-03-03 – 2023-03-06 (×2): qty 3, 28d supply, fill #4
  Filled 2023-04-04: qty 3, 28d supply, fill #5
  Filled 2023-04-30: qty 3, 28d supply, fill #6
  Filled 2023-05-27: qty 3, 28d supply, fill #7
  Filled 2023-06-22: qty 3, 28d supply, fill #8
  Filled 2023-07-07 – 2023-07-13 (×3): qty 3, 28d supply, fill #9

## 2022-08-19 ENCOUNTER — Other Ambulatory Visit (HOSPITAL_BASED_OUTPATIENT_CLINIC_OR_DEPARTMENT_OTHER): Payer: Self-pay

## 2022-09-02 ENCOUNTER — Other Ambulatory Visit (HOSPITAL_BASED_OUTPATIENT_CLINIC_OR_DEPARTMENT_OTHER): Payer: Self-pay

## 2022-09-02 ENCOUNTER — Other Ambulatory Visit: Payer: Self-pay

## 2022-09-08 ENCOUNTER — Other Ambulatory Visit (HOSPITAL_BASED_OUTPATIENT_CLINIC_OR_DEPARTMENT_OTHER): Payer: Self-pay

## 2022-09-19 ENCOUNTER — Ambulatory Visit (INDEPENDENT_AMBULATORY_CARE_PROVIDER_SITE_OTHER): Payer: 59 | Admitting: Nurse Practitioner

## 2022-09-19 ENCOUNTER — Encounter: Payer: Self-pay | Admitting: Nurse Practitioner

## 2022-09-19 VITALS — BP 126/80 | HR 78 | Wt 212.8 lb

## 2022-09-19 DIAGNOSIS — Z6836 Body mass index (BMI) 36.0-36.9, adult: Secondary | ICD-10-CM | POA: Diagnosis not present

## 2022-09-19 DIAGNOSIS — E038 Other specified hypothyroidism: Secondary | ICD-10-CM

## 2022-09-19 DIAGNOSIS — M542 Cervicalgia: Secondary | ICD-10-CM

## 2022-09-19 DIAGNOSIS — G8929 Other chronic pain: Secondary | ICD-10-CM | POA: Insufficient documentation

## 2022-09-19 DIAGNOSIS — N62 Hypertrophy of breast: Secondary | ICD-10-CM | POA: Insufficient documentation

## 2022-09-19 DIAGNOSIS — E611 Iron deficiency: Secondary | ICD-10-CM | POA: Insufficient documentation

## 2022-09-19 DIAGNOSIS — M533 Sacrococcygeal disorders, not elsewhere classified: Secondary | ICD-10-CM

## 2022-09-19 DIAGNOSIS — R5383 Other fatigue: Secondary | ICD-10-CM | POA: Insufficient documentation

## 2022-09-19 DIAGNOSIS — E782 Mixed hyperlipidemia: Secondary | ICD-10-CM | POA: Insufficient documentation

## 2022-09-19 HISTORY — DX: Iron deficiency: E61.1

## 2022-09-19 NOTE — Assessment & Plan Note (Signed)
Erica Hull is interested in breast reduction due to neck and upper back pain, with a significant history of discomfort and chiropractic interventions. She is considering surgery after managing other health priorities. She has tried weight management successfully with no significant impact on breast size.  Plan: - Refer to Dr. Marla Roe for a plastic surgery consultation regarding breast reduction.

## 2022-09-19 NOTE — Assessment & Plan Note (Signed)
Keyawna is following a low fat diet and successfully managing weight with [id number].  Plan: Labs pending today.

## 2022-09-19 NOTE — Assessment & Plan Note (Signed)
Ongoing fatigue of unknown etiology in the setting of recent diagnosis of iron deficiency when attempting to donate blood. No alarm symptoms are present today.  Plan: - monitor labs today to evaluate for iron deficiency - will make changes to the plan of care based on findings.

## 2022-09-19 NOTE — Assessment & Plan Note (Signed)
Erica Hull experiences persistent low back pain, initially improved with yoga and stretching, now presenting as a dull ache, suspected to be related to the SI joint. She has a history of bursitis and hip issues, but symptoms were different.  Plan: - Continue with stretching exercises targeting the SI joint. - Use a TENS unit for pain management as recommended. - Consider an orthopedic consultation if symptoms persist. - Use Aleve for pain relief and apply ice as needed.

## 2022-09-19 NOTE — Progress Notes (Signed)
Worthy Keeler, DNP, AGNP-c [place]  [address] [place], [place] [address] [phone]  ESTABLISHED PATIENT- Chronic Health and/or Follow-Up Visit  Blood pressure 126/80, pulse 78, weight 212 lb 12.8 oz (96.5 kg).    Erica Hull is a 57 y.o. year old female presenting today for evaluation and management of the following:  Erica Hull reports that she has successfully obtained Wegovy. She reports significant weight loss since starting Wegovy, with occasional nausea managed by peppermint instead of Zofran. Erica Hull mentions a suppressed appetite and reduced physical activity due to back pain but has enjoyed playing pickleball and using an elliptical machine.  She recently experienced issues with low iron levels, which prevented her from donating blood as usual. Erica Hull notes a history of normal iron levels and does not believe her diet is contributing. She has not had any signs of bleeding. . She mentions increased tiredness, attributing it to various factors, including the time change and possibly the effects of Wegovy on her coffee consumption. She has switched to diet soda in the afternoons for a caffeine boost.  Erica Hull is experiencing flank pain, initially managed with yoga and stretching, believing it to be related to a tight hip. However, the pain has persisted and become a dull ache. She mentions a past diagnosis of bursitis and hip issues in her teens.  She also discusses the need for a breast reduction consultation due to chronic neck and upper back pain, attributing it to the size of her breasts rather than her weight, which has not significantly affected breast size despite recent weight loss. She reports indentations in her shoulders from her bra straps, which she feels also contributes to her neck and upper back pain.    All ROS negative with exception of what is listed above.   PHYSICAL EXAM Physical Exam Vitals and nursing note reviewed.  Constitutional:       General: She is not in acute distress.    Appearance: Normal appearance.  HENT:     Head: Normocephalic.  Eyes:     Extraocular Movements: Extraocular movements intact.     Conjunctiva/sclera: Conjunctivae normal.     Pupils: Pupils are equal, round, and reactive to light.  Neck:     Vascular: No carotid bruit.  Cardiovascular:     Rate and Rhythm: Normal rate and regular rhythm.     Pulses: Normal pulses.     Heart sounds: Normal heart sounds. No murmur heard. Pulmonary:     Effort: Pulmonary effort is normal.     Breath sounds: Normal breath sounds. No wheezing.  Abdominal:     General: Bowel sounds are normal. There is no distension.     Palpations: Abdomen is soft.     Tenderness: There is no abdominal tenderness. There is no guarding.  Musculoskeletal:        General: Normal range of motion.     Cervical back: Normal range of motion and neck supple.     Right lower leg: No edema.     Left lower leg: No edema.  Lymphadenopathy:     Cervical: No cervical adenopathy.  Skin:    General: Skin is warm and dry.     Capillary Refill: Capillary refill takes less than 2 seconds.  Neurological:     General: No focal deficit present.     Mental Status: She is alert and oriented to person, place, and time.  Psychiatric:        Mood and Affect: Mood normal.  Behavior: Behavior normal.        Thought Content: Thought content normal.        Judgment: Judgment normal.     PLAN Problem List Items Addressed This Visit     Hypothyroidism    Erica Hull has not had recent thyroid function tests and is currently on Levothyroxine, with a noted need for Levothyroxine refill. She is not having any symptoms at this time.  Plan: - Order comprehensive blood work to assess thyroid function. - Adjust Levothyroxine dosage based on lab results.      Relevant Orders   Hemoglobin A1c (Completed)   Lipid panel (Completed)   Iron, TIBC and Ferritin Panel (Completed)   TSH (Completed)    CBC With Diff/Platelet (Completed)   Comprehensive metabolic panel (Completed)   T4, free (Completed)   BMI 36.0-36.9,adult - Primary    Erica Hull is successfully continuing with Wegovy. She reports significant weight loss and occasional nausea, which she manages with peppermint. Appetite suppression is also noted as a beneficial effect. She has had a greater than 25lb weight loss since starting.  Plan: - Continue [id number] as currently prescribed. - Monitor for any side effects and adjust treatment as needed. - Ensure availability of Wegovy refills at [facility].      Relevant Orders   Hemoglobin A1c (Completed)   Lipid panel (Completed)   Iron, TIBC and Ferritin Panel (Completed)   TSH (Completed)   CBC With Diff/Platelet (Completed)   Comprehensive metabolic panel (Completed)   T4, free (Completed)   Chronic right SI joint pain    Erica Hull experiences persistent low back pain, initially improved with yoga and stretching, now presenting as a dull ache, suspected to be related to the SI joint. She has a history of bursitis and hip issues, but symptoms were different.  Plan: - Continue with stretching exercises targeting the SI joint. - Use a TENS unit for pain management as recommended. - Consider an orthopedic consultation if symptoms persist. - Use Aleve for pain relief and apply ice as needed.      Relevant Orders   Ambulatory referral to Orthopedics   Hemoglobin A1c (Completed)   Lipid panel (Completed)   Iron, TIBC and Ferritin Panel (Completed)   TSH (Completed)   CBC With Diff/Platelet (Completed)   Comprehensive metabolic panel (Completed)   T4, free (Completed)   Fatigue    Ongoing fatigue of unknown etiology in the setting of recent diagnosis of iron deficiency when attempting to donate blood. No alarm symptoms are present today.  Plan: - monitor labs today to evaluate for iron deficiency - will make changes to the plan of care based on findings.       Relevant Orders    Hemoglobin A1c (Completed)   Lipid panel (Completed)   Iron, TIBC and Ferritin Panel (Completed)   TSH (Completed)   CBC With Diff/Platelet (Completed)   Comprehensive metabolic panel (Completed)   T4, free (Completed)   Moderate mixed hyperlipidemia not requiring statin therapy    Erica Hull is following a low fat diet and successfully managing weight with [id number].  Plan: Labs pending today.       Relevant Orders   Hemoglobin A1c (Completed)   Lipid panel (Completed)   Iron, TIBC and Ferritin Panel (Completed)   TSH (Completed)   CBC With Diff/Platelet (Completed)   Comprehensive metabolic panel (Completed)   T4, free (Completed)   Iron deficiency    Erica Hull is unable to donate blood due to low iron levels, which is  new. She is also experiencing increased fatigue. No bleeding observed. Coloration is appropriate and HRRR today.  Plan: - obtain labs today to evaluate and monitor for anemia.       Large breasts    Erica Hull is interested in breast reduction due to neck and upper back pain, with a significant history of discomfort and chiropractic interventions. She is considering surgery after managing other health priorities. She has tried weight management successfully with no significant impact on breast size.  Plan: - Refer to Dr. Marla Roe for a plastic surgery consultation regarding breast reduction.      Relevant Orders   Ambulatory referral to Plastic Surgery   Other Visit Diagnoses     Neck pain       Relevant Orders   Ambulatory referral to Plastic Surgery       Return in about 6 months (around 03/22/2023) for Med Management.   Worthy Keeler, DNP, AGNP-c 09/19/2022  9:27 AM

## 2022-09-19 NOTE — Assessment & Plan Note (Signed)
Erica Hull is unable to donate blood due to low iron levels, which is new. She is also experiencing increased fatigue. No bleeding observed. Coloration is appropriate and HRRR today.  Plan: - obtain labs today to evaluate and monitor for anemia.

## 2022-09-19 NOTE — Assessment & Plan Note (Signed)
Erica Hull has not had recent thyroid function tests and is currently on Levothyroxine, with a noted need for Levothyroxine refill. She is not having any symptoms at this time.  Plan: - Order comprehensive blood work to assess thyroid function. - Adjust Levothyroxine dosage based on lab results.

## 2022-09-19 NOTE — Assessment & Plan Note (Signed)
Erica Hull is successfully continuing with Wegovy. She reports significant weight loss and occasional nausea, which she manages with peppermint. Appetite suppression is also noted as a beneficial effect. She has had a greater than 25lb weight loss since starting.  Plan: - Continue [id number] as currently prescribed. - Monitor for any side effects and adjust treatment as needed. - Ensure availability of Wegovy refills at [facility].

## 2022-09-19 NOTE — Patient Instructions (Addendum)
You are doing fantastic! I am so proud of you.   I have included information on the SI joint dysfunction for you to look over and sent the referral to Orthopedics for you.   You can also try the TENS unit to see if that helps.   I have also sent a referral for consultation to Dr. Marla Roe with plastic surgery.  I will send a refill of the levothyroxine when we get the results back to make sure we have you on the right dose.   Get your shingles vaccine at your convenience.

## 2022-09-20 ENCOUNTER — Other Ambulatory Visit (HOSPITAL_BASED_OUTPATIENT_CLINIC_OR_DEPARTMENT_OTHER): Payer: Self-pay

## 2022-09-20 ENCOUNTER — Encounter (HOSPITAL_BASED_OUTPATIENT_CLINIC_OR_DEPARTMENT_OTHER): Payer: Self-pay | Admitting: Pharmacist

## 2022-09-20 ENCOUNTER — Encounter: Payer: Self-pay | Admitting: Physician Assistant

## 2022-09-20 ENCOUNTER — Encounter: Payer: Self-pay | Admitting: Nurse Practitioner

## 2022-09-20 ENCOUNTER — Other Ambulatory Visit: Payer: Self-pay

## 2022-09-20 DIAGNOSIS — E038 Other specified hypothyroidism: Secondary | ICD-10-CM

## 2022-09-20 LAB — T4, FREE: Free T4: 1.33 ng/dL (ref 0.82–1.77)

## 2022-09-20 LAB — IRON,TIBC AND FERRITIN PANEL
Ferritin: 5 ng/mL — ABNORMAL LOW (ref 15–150)
Iron Saturation: 5 % — CL (ref 15–55)
Iron: 21 ug/dL — ABNORMAL LOW (ref 27–159)
Total Iron Binding Capacity: 388 ug/dL (ref 250–450)
UIBC: 367 ug/dL (ref 131–425)

## 2022-09-20 LAB — CBC WITH DIFF/PLATELET
Basophils Absolute: 0.1 10*3/uL (ref 0.0–0.2)
Basos: 2 %
EOS (ABSOLUTE): 0.2 10*3/uL (ref 0.0–0.4)
Eos: 4 %
Hematocrit: 34.9 % (ref 34.0–46.6)
Hemoglobin: 10.6 g/dL — ABNORMAL LOW (ref 11.1–15.9)
Immature Grans (Abs): 0 10*3/uL (ref 0.0–0.1)
Immature Granulocytes: 0 %
Lymphocytes Absolute: 1.6 10*3/uL (ref 0.7–3.1)
Lymphs: 31 %
MCH: 21 pg — ABNORMAL LOW (ref 26.6–33.0)
MCHC: 30.4 g/dL — ABNORMAL LOW (ref 31.5–35.7)
MCV: 69 fL — ABNORMAL LOW (ref 79–97)
Monocytes Absolute: 0.6 10*3/uL (ref 0.1–0.9)
Monocytes: 11 %
Neutrophils Absolute: 2.6 10*3/uL (ref 1.4–7.0)
Neutrophils: 52 %
Platelets: 265 10*3/uL (ref 150–450)
RBC: 5.04 x10E6/uL (ref 3.77–5.28)
RDW: 16.9 % — ABNORMAL HIGH (ref 11.7–15.4)
WBC: 5.1 10*3/uL (ref 3.4–10.8)

## 2022-09-20 LAB — COMPREHENSIVE METABOLIC PANEL
ALT: 13 IU/L (ref 0–32)
AST: 22 IU/L (ref 0–40)
Albumin/Globulin Ratio: 2 (ref 1.2–2.2)
Albumin: 4.2 g/dL (ref 3.8–4.9)
Alkaline Phosphatase: 84 IU/L (ref 44–121)
BUN/Creatinine Ratio: 23 (ref 9–23)
BUN: 17 mg/dL (ref 6–24)
Bilirubin Total: 0.4 mg/dL (ref 0.0–1.2)
CO2: 23 mmol/L (ref 20–29)
Calcium: 10 mg/dL (ref 8.7–10.2)
Chloride: 105 mmol/L (ref 96–106)
Creatinine, Ser: 0.74 mg/dL (ref 0.57–1.00)
Globulin, Total: 2.1 g/dL (ref 1.5–4.5)
Glucose: 90 mg/dL (ref 70–99)
Potassium: 4.5 mmol/L (ref 3.5–5.2)
Sodium: 141 mmol/L (ref 134–144)
Total Protein: 6.3 g/dL (ref 6.0–8.5)
eGFR: 95 mL/min/{1.73_m2} (ref >59)

## 2022-09-20 LAB — HEMOGLOBIN A1C
Est. average glucose Bld gHb Est-mCnc: 123 mg/dL
Hgb A1c MFr Bld: 5.9 % — ABNORMAL HIGH (ref 4.8–5.6)

## 2022-09-20 LAB — TSH: TSH: 2.82 u[IU]/mL (ref 0.450–4.500)

## 2022-09-20 LAB — LIPID PANEL
Chol/HDL Ratio: 2.9 ratio (ref 0.0–4.4)
Cholesterol, Total: 175 mg/dL (ref 100–199)
HDL: 60 mg/dL (ref >39)
LDL Chol Calc (NIH): 104 mg/dL — ABNORMAL HIGH (ref 0–99)
Triglycerides: 59 mg/dL (ref 0–149)
VLDL Cholesterol Cal: 11 mg/dL (ref 5–40)

## 2022-09-20 MED ORDER — LEVOTHYROXINE SODIUM 88 MCG PO TABS: 88.0000 ug | ORAL_TABLET | Freq: Every day | ORAL | 4 refills | Status: DC

## 2022-09-20 MED ORDER — IRON (FERROUS SULFATE) 325 (65 FE) MG PO TABS
1.0000 | ORAL_TABLET | ORAL | 3 refills | Status: DC
  Filled 2022-09-20: qty 45, 90d supply, fill #0
  Filled 2022-11-28: qty 45, 90d supply, fill #1
  Filled 2023-03-04: qty 45, 90d supply, fill #2

## 2022-09-20 MED ORDER — LEVOTHYROXINE SODIUM 88 MCG PO TABS
88.0000 ug | ORAL_TABLET | Freq: Every day | ORAL | 4 refills | Status: DC
  Filled 2022-09-20: qty 30, 30d supply, fill #0

## 2022-09-20 NOTE — Addendum Note (Signed)
Addended by: Ares Cardozo, Clarise Cruz E on: 09/20/2022 08:34 AM   Modules accepted: Orders

## 2022-09-29 ENCOUNTER — Other Ambulatory Visit (HOSPITAL_BASED_OUTPATIENT_CLINIC_OR_DEPARTMENT_OTHER): Payer: Self-pay

## 2022-09-29 MED ORDER — DICLOFENAC SODIUM 75 MG PO TBEC
75.0000 mg | DELAYED_RELEASE_TABLET | Freq: Two times a day (BID) | ORAL | 0 refills | Status: DC
  Filled 2022-09-29: qty 60, 30d supply, fill #0

## 2022-10-03 ENCOUNTER — Other Ambulatory Visit (HOSPITAL_BASED_OUTPATIENT_CLINIC_OR_DEPARTMENT_OTHER): Payer: Self-pay

## 2022-10-11 ENCOUNTER — Ambulatory Visit (INDEPENDENT_AMBULATORY_CARE_PROVIDER_SITE_OTHER): Payer: 59 | Admitting: Plastic Surgery

## 2022-10-11 ENCOUNTER — Encounter: Payer: Self-pay | Admitting: Plastic Surgery

## 2022-10-11 VITALS — BP 130/87 | HR 74 | Ht 67.0 in | Wt 208.6 lb

## 2022-10-11 DIAGNOSIS — M546 Pain in thoracic spine: Secondary | ICD-10-CM

## 2022-10-11 DIAGNOSIS — G8929 Other chronic pain: Secondary | ICD-10-CM

## 2022-10-11 DIAGNOSIS — N62 Hypertrophy of breast: Secondary | ICD-10-CM | POA: Diagnosis not present

## 2022-10-11 DIAGNOSIS — M542 Cervicalgia: Secondary | ICD-10-CM | POA: Diagnosis not present

## 2022-10-11 DIAGNOSIS — Z6832 Body mass index (BMI) 32.0-32.9, adult: Secondary | ICD-10-CM

## 2022-10-11 DIAGNOSIS — R21 Rash and other nonspecific skin eruption: Secondary | ICD-10-CM

## 2022-10-11 DIAGNOSIS — M545 Low back pain, unspecified: Secondary | ICD-10-CM | POA: Diagnosis not present

## 2022-10-11 DIAGNOSIS — Z6836 Body mass index (BMI) 36.0-36.9, adult: Secondary | ICD-10-CM

## 2022-10-11 DIAGNOSIS — M549 Dorsalgia, unspecified: Secondary | ICD-10-CM | POA: Insufficient documentation

## 2022-10-11 NOTE — Progress Notes (Signed)
Patient ID: Erica Hull, female    DOB: 1966/03/07, 57 y.o.   MRN: [id number]   Chief Complaint  Patient presents with   Consult   Breast Problem    Mammary Hyperplasia: The patient is a 57 y.o. female with a history of mammary hyperplasia for several years.  She has extremely large breasts causing symptoms that include the following: Back pain in the upper and lower back, including neck pain. She pulls or pins her bra straps to provide better lift and relief of the pressure and pain. She notices relief by holding her breast up manually.  Her shoulder straps cause grooves and pain and pressure that requires padding for relief. Pain medication is sometimes required with motrin and tylenol.  Activities that are hindered by enlarged breasts include: exercise and running.  She has tried supportive clothing as well as fitted bras without improvement.  Her breasts are extremely large and fairly symmetric.  She has hyperpigmentation of the inframammary area on both sides.  The sternal to nipple distance on the right is 33 cm and the left is 34 cm.  The IMF distance is 18 cm.  She is 5 feet 7 inches tall and weighs 208 pounds.  The BMI = 32.6 kg/m.  Preoperative bra size = H cup. She would like to be a C/D cup.  The estimated excess breast tissue to be removed at the time of surgery = 700 grams on the left and 700 grams on the right.  Mammogram history: 9/23 negative.  Family history of breast cancer:  none.  Tobacco use:  none.   The patient expresses the desire to pursue surgical intervention.  She is on wegovy which helped her lose 30 pounds over the past year.  She is hopeful to be able to decrease it some more.     Review of Systems  Constitutional: Negative.   HENT: Negative.    Eyes: Negative.   Respiratory: Negative.  Negative for chest tightness and shortness of breath.   Cardiovascular: Negative.   Gastrointestinal: Negative.   Endocrine: Negative.   Genitourinary: Negative.    Musculoskeletal:  Positive for back pain and neck pain.  Skin:  Positive for rash.    Past Medical History:  Diagnosis Date   Elective abortion    X2   H/O peripheral vascular disease 2006   Heart murmur    Hx of cystitis 2006   Hypertension    pregnancy induced   Hypothyroidism    Missed ab     Past Surgical History:  Procedure Laterality Date   CESAREAN SECTION     CESAREAN SECTION W/BTL     DILATION AND CURETTAGE OF UTERUS     ENDOMETRIAL ABLATION     HTA   ENDOSCOPIC VEIN LASER TREATMENT     vein closure procedure Left 2006      Current Outpatient Medications:    B Complex-C-Folic Acid (SUPER B COMPLEX/FA/VIT C PO), Take 1 tablet by mouth daily., Disp: , Rfl:    Cholecalciferol (D3-1000) 25 MCG (1000 UT) tablet, , Disp: , Rfl:    Cyanocobalamin (VITAMIN B 12 PO), Take 1,000 mcg by mouth daily., Disp: , Rfl:    diclofenac (VOLTAREN) 75 MG EC tablet, Take 1 tablet (75 mg total) by mouth 2 (two) times daily with a meal., Disp: 60 tablet, Rfl: 0   Ginkgo Biloba 120 MG CAPS, Take 1 tablet by mouth daily., Disp: , Rfl:    Iron, Ferrous Sulfate, 325 (  65 Fe) MG TABS, Take 1 tablet by mouth every other day., Disp: 45 tablet, Rfl: 3   levothyroxine (SYNTHROID) 88 MCG tablet, Take 1 tablet (88 mcg total) by mouth daily., Disp: 90 tablet, Rfl: 4   metoprolol succinate (TOPROL-XL) 25 MG 24 hr tablet, TAKE 1 TABLET BY MOUTH DAILY, Disp: 90 tablet, Rfl: 1   Omega-3 Fatty Acids (FISH OIL) 1200 MG CAPS, Take 1 capsule by mouth daily., Disp: , Rfl:    omeprazole (PRILOSEC) 20 MG capsule, Take 20 mg by mouth as needed (indigestion)., Disp: , Rfl:    ondansetron (ZOFRAN) 8 MG tablet, Take 1 tablet (8 mg total) by mouth every 8 (eight) hours as needed for nausea or vomiting., Disp: 20 tablet, Rfl: 3   Rhubarb (ESTROVEN COMPLETE PO), Take 1 capsule by mouth daily., Disp: , Rfl:    Semaglutide-Weight Management (WEGOVY) 2.4 MG/0.75ML SOAJ, Inject 2.4 mg into the skin once a week., Disp: 9 mL,  Rfl: 3   TURMERIC CURCUMIN PO, Take 2,250 mg by mouth daily., Disp: , Rfl:    vitamin E 400 UNIT capsule, Take 400 Units by mouth daily., Disp: , Rfl:    Objective:   Vitals:   10/11/22 1453  BP: 130/87  Pulse: 74  SpO2: 97%    Physical Exam Vitals and nursing note reviewed.  Constitutional:      Appearance: Normal appearance.  HENT:     Head: Normocephalic and atraumatic.  Cardiovascular:     Rate and Rhythm: Normal rate.     Pulses: Normal pulses.  Pulmonary:     Effort: Pulmonary effort is normal.  Abdominal:     Palpations: Abdomen is soft.  Musculoskeletal:        General: No swelling or deformity.  Skin:    General: Skin is warm.     Capillary Refill: Capillary refill takes less than 2 seconds.     Coloration: Skin is not jaundiced.     Findings: No bruising.  Neurological:     Mental Status: She is alert and oriented to person, place, and time.  Psychiatric:        Mood and Affect: Mood normal.        Behavior: Behavior normal.        Thought Content: Thought content normal.        Judgment: Judgment normal.    Assessment & Plan:  Symptomatic mammary hypertrophy  BMI 36.0-36.9,adult  Chronic bilateral thoracic back pain  The procedure the patient selected and that was best for the patient was discussed. The risk were discussed and include but not limited to the following:  Breast asymmetry, fluid accumulation, firmness of the breast, inability to breast feed, loss of nipple or areola, skin loss, change in skin and nipple sensation, fat necrosis of the breast tissue, bleeding, infection and healing delay.  There are risks of anesthesia and injury to nerves or blood vessels.  Allergic reaction to tape, suture and skin glue are possible.  There will be swelling.  Any of these can lead to the need for revisional surgery which is not included in this surgery.  A breast reduction has potential to interfere with diagnostic procedures in the future.  This procedure is  best done when the breast is fully developed.  Changes in the breast will continue to occur over time: pregnancy, weight gain or weigh loss. No guarantees are given for a certain bra or breast size.    Total time: 40 minutes. This includes time spent  with the patient during the visit as well as time spent before and after the visit reviewing the chart, documenting the encounter, ordering pertinent studies and literature for the patient.   Physical therapy: Has seen a chiropractor Mammogram: Today   Patient is going to continue to try and lose a little bit more weight.  She will come back and see [place] in the next 4 to 5 months.  If she is closer to her ideal weight then we will go ahead and submit.  She is hoping to get the surgery in before the end of the year.  Pictures were obtained of the patient and placed in the chart with the patient's or guardian's permission.   [place], DO

## 2022-10-12 ENCOUNTER — Telehealth: Payer: Self-pay

## 2022-10-12 NOTE — Telephone Encounter (Signed)
Faxed records request to [facility] [phone]) with confirmed receipt.

## 2022-10-14 ENCOUNTER — Institutional Professional Consult (permissible substitution): Payer: 59 | Admitting: Plastic Surgery

## 2022-10-19 ENCOUNTER — Encounter: Payer: Self-pay | Admitting: Nurse Practitioner

## 2022-10-19 DIAGNOSIS — E038 Other specified hypothyroidism: Secondary | ICD-10-CM

## 2022-10-21 ENCOUNTER — Ambulatory Visit (INDEPENDENT_AMBULATORY_CARE_PROVIDER_SITE_OTHER): Payer: 59 | Admitting: Obstetrics & Gynecology

## 2022-10-21 ENCOUNTER — Other Ambulatory Visit (HOSPITAL_COMMUNITY)
Admission: RE | Admit: 2022-10-21 | Discharge: 2022-10-21 | Disposition: A | Discharge status: Home / Self Care | Payer: 59 | Source: Ambulatory Visit | Attending: Obstetrics & Gynecology | Admitting: Obstetrics & Gynecology

## 2022-10-21 ENCOUNTER — Encounter: Payer: Self-pay | Admitting: Obstetrics & Gynecology

## 2022-10-21 VITALS — BP 110/74 | HR 90

## 2022-10-21 DIAGNOSIS — N950 Postmenopausal bleeding: Secondary | ICD-10-CM | POA: Diagnosis present

## 2022-10-21 NOTE — Progress Notes (Signed)
    Erica Hull October 11, 1965 [id number]        57 y.o.  [id number] S/P BTL  RP: PMB x yesterday  HPI: Menopausal, well on no HRT.  Had LPM 03/2021.  Mild occasional hot flushes.  Felt pelvic cramps this week, and started having vaginal bleeding like a period yesterday and today.  Flow is mild to moderate.  Urine/BMs normal.   OB History  Gravida Para Term Preterm AB Living  4 2 2   2 2   SAB IAB Ectopic Multiple Live Births          2    # Outcome Date GA Lbr Len/2nd Weight Sex Delivery Anes PTL Lv  4 AB           3 AB           2 Term     F CS-Unspec  N LIV  1 Term     M CS-Unspec  N LIV    Past medical history,surgical history, problem list, medications, allergies, family history and social history were all reviewed and documented in the EPIC chart.   Directed ROS with pertinent positives and negatives documented in the history of present illness/assessment and plan.  Exam:  Vitals:   10/21/22 1325  BP: 110/74  Pulse: 90  SpO2: 98%   General appearance:  Normal  Written informed consent obtained for EBx.  EBx procedure:  Vulva normal.   Assessment/Plan:  57 y.o. [id number]   1. Postmenopausal bleeding  - US Transvaginal Non-OB; Future - Surgical pathology( [place]/ [facility])   Genia Del MD, 1:38 PM 10/21/2022

## 2022-10-22 ENCOUNTER — Encounter: Payer: Self-pay | Admitting: Obstetrics & Gynecology

## 2022-10-24 ENCOUNTER — Other Ambulatory Visit (HOSPITAL_BASED_OUTPATIENT_CLINIC_OR_DEPARTMENT_OTHER): Payer: Self-pay

## 2022-10-24 MED ORDER — LEVOTHYROXINE SODIUM 88 MCG PO TABS
ORAL_TABLET | ORAL | 4 refills | Status: DC
  Filled 2022-10-24: qty 32, 28d supply, fill #0

## 2022-10-25 LAB — SURGICAL PATHOLOGY

## 2022-10-28 NOTE — Progress Notes (Unsigned)
11/01/2022 Erica Hull [id number] [birth year]  Referring provider: Tollie Eth, NP Primary GI doctor: Dr. Meridee Score  ASSESSMENT AND PLAN:   57 year old female with history of GERD presents for new onset fatigue and IDA found physical, lower abdominal pain, nausea with vomiting on Wegovy Very slightly positive FOBT on exam today, possible posterior anal nodules previous unremarkable colonoscopy 08/2018 She is having postmenopausal bleeding, had unremarkable endometrial biopsy and is pending pelvic ultrasound this week.   Suggest continuing with OB/GYN workup Will check CBC and iron/ferritin to see response to oral iron, can get IV outpatient if not responding. Add on miralax daily, continue PPI daily, avoid NSAIDS Will need to hold wegovy prior to evaluation I suggest endoscopy as well as colonoscopy due to patient's symptoms mentioned previously with her IDA Risk of bowel prep, conscious sedation, and EGD and colonoscopy were discussed.  Risks include but are not limited to dehydration, pain, bleeding, cardiopulmonary process, bowel perforation, or other possible adverse outcomes..  Treatment plan was discussed with patient, and agreed upon, patient scheduled at [facility] with Dr. Meridee Score. Further recommendations after EGD and colonoscopy   Patient Care Team: Early, Sung Amabile, NP as PCP - General (Nurse Practitioner) Kieth Brightly, MD as Consulting Physician (General Surgery) Genia Del, MD as Consulting Physician (Obstetrics and Gynecology) Alver Sorrow, NP as Nurse Practitioner (Cardiology)  HISTORY OF PRESENT ILLNESS: 57 y.o. female with a past medical history of PVD, HTN, hypothyroidism, anemia and others listed below presents for evaluation of IDA.   08/24/2018 Colonoscopy for screening with Dr. Meridee Score unremarkable colon, recall 10 years  She gives blood frequently and has never had an issues, she was recently turned away due to anemia.   09/19/22 show IDA with HGB 10.6 MCV 69 Iron 21 Ferritin 5 She states she had her CPE, was complaining of fatigue and found IDA.  Post menopausal bleeding work up with Dr. Seymour Bars recently, mild to moderate, s/p Ebx with path showing benign endometrium, focal squamous metaplasia, no atypia or hyperplasia. Getting pelvis US 04/25 She has history of hemorrhoids, does not bleed often.  Started on iron with PCP and has had some constipation and slight flair of her hemorrhoids but denies significant bleeding. She was having regular BM but has had more constipation, she goes every 3 days, has hard stool followed by diarrhea.  She has had 1-2 weeks of left and right lower AB cramping, fairly constant, nothing better or worse.  The last 2 weeks, one day a week has nausea with one episode of bilious vomiting in the morning, has been on wegovy for a year without issues, does not correlate with shot day.   She is on omeprazole 20 mg daily.  No family history of colon cancer.  She is on wegovy for the last year. She is on Voltaren a few times a week.  She denies blood thinner use.  She denies ETOH use.   She denies tobacco use.  She denies drug use.    She  reports that she has never smoked. She has never used smokeless tobacco. She reports that she does not currently use alcohol. She reports that she does not use drugs.  RELEVANT LABS AND IMAGING: CBC    Component Value Date/Time   WBC 5.1 09/19/2022 1006   WBC 5.4 07/02/2021 1023   RBC 5.04 09/19/2022 1006   RBC 4.46 07/02/2021 1023   HGB 10.6 (L) 09/19/2022 1006   HCT 34.9 09/19/2022 1006   PLT  265 09/19/2022 1006   MCV 69 (L) 09/19/2022 1006   MCH 21.0 (L) 09/19/2022 1006   MCH 28.3 07/02/2021 1023   MCHC 30.4 (L) 09/19/2022 1006   MCHC 32.8 07/02/2021 1023   RDW 16.9 (H) 09/19/2022 1006   LYMPHSABS 1.6 09/19/2022 1006   MONOABS 572 12/12/2016 0904   EOSABS 0.2 09/19/2022 1006   BASOSABS 0.1 09/19/2022 1006   Recent Labs     09/19/22 1006  HGB 10.6*    CMP     Component Value Date/Time   NA 141 09/19/2022 1006   K 4.5 09/19/2022 1006   CL 105 09/19/2022 1006   CO2 23 09/19/2022 1006   GLUCOSE 90 09/19/2022 1006   GLUCOSE 82 07/02/2021 1023   BUN 17 09/19/2022 1006   CREATININE 0.74 09/19/2022 1006   CREATININE 0.73 07/02/2021 1023   CALCIUM 10.0 09/19/2022 1006   PROT 6.3 09/19/2022 1006   ALBUMIN 4.2 09/19/2022 1006   AST 22 09/19/2022 1006   ALT 13 09/19/2022 1006   ALKPHOS 84 09/19/2022 1006   BILITOT 0.4 09/19/2022 1006      Latest Ref Rng & Units 09/19/2022   10:06 AM 07/02/2021   10:23 AM 04/06/2020   11:14 AM  Hepatic Function  Total Protein 6.0 - 8.5 g/dL 6.3  6.5  6.4   Albumin 3.8 - 4.9 g/dL 4.2     AST 0 - 40 IU/L ALT 0 - 32 IU/L Alk Phosphatase 44 - 121 IU/L 84     Total Bilirubin 0.0 - 1.2 mg/dL 0.4  0.7  0.7       Current Medications:   Current Outpatient Medications (Endocrine & Metabolic):    levothyroxine (SYNTHROID) 88 MCG tablet, Take 1 tab ( ) by mouth 5 days a week and 1.5 tabs ( ) by mouth 2 days a week.  Current Outpatient Medications (Cardiovascular):    metoprolol succinate (TOPROL-XL) 25 MG 24 hr tablet, TAKE 1 TABLET BY MOUTH DAILY   Current Outpatient Medications (Analgesics):    diclofenac (VOLTAREN) 75 MG EC tablet, Take 1 tablet (75 mg total) by mouth 2 (two) times daily with a meal.  Current Outpatient Medications (Hematological):    Cyanocobalamin (VITAMIN B 12 PO), Take 1,000 mcg by mouth daily.   Iron, Ferrous Sulfate, 325 (65 Fe) MG TABS, Take 1 tablet by mouth every other day.  Current Outpatient Medications (Other):    B Complex-C-Folic Acid (SUPER B COMPLEX/FA/VIT C PO), Take 1 tablet by mouth daily.   Cholecalciferol (D3-1000) 25 MCG (1000 UT) tablet,    Ginkgo Biloba 120 MG CAPS, Take 1 tablet by mouth daily.   Na Sulfate-K Sulfate-Mg Sulf 17.5-3.13-1.6 GM/177ML SOLN, Take as directed.   Omega-3 Fatty  Acids (FISH OIL) 1200 MG CAPS, Take 1 capsule by mouth daily.   omeprazole (PRILOSEC) 20 MG capsule, Take 20 mg by mouth as needed (indigestion).   ondansetron (ZOFRAN) 8 MG tablet, Take 1 tablet (8 mg total) by mouth every 8 (eight) hours as needed for nausea or vomiting.   Rhubarb (ESTROVEN COMPLETE PO), Take 1 capsule by mouth daily.   Semaglutide-Weight Management (WEGOVY) 2.4 MG/0.75ML SOAJ, Inject 2.4 mg into the skin once a week.   TURMERIC CURCUMIN PO, Take 2,250 mg by mouth daily.   vitamin E 400 UNIT capsule, Take 400 Units by mouth daily.  Medical History:  Past Medical History:  Diagnosis Date   Anemia  Elective abortion    X2   H/O peripheral vascular disease 2006   Heart murmur    Hx of cystitis 2006   Hypertension    pregnancy induced   Hypothyroidism    Missed ab    Allergies: No Known Allergies   Surgical History:  She  has a past surgical history that includes Cesarean section; Cesarean section w/btl; Dilation and curettage of uterus; Endometrial ablation; vein closure procedure (Left, 2006); and Endoscopic vein laser treatment. Family History:  Her family history includes Atrial fibrillation in her father; COPD in her mother; Cancer (age of onset: 84) in her father; Colon polyps in her father; Dementia in her mother; Hypertension in her father; Rheum arthritis in her mother.  REVIEW OF SYSTEMS  : All other systems reviewed and negative except where noted in the History of Present Illness.  PHYSICAL EXAM: BP 126/80   Pulse 80   Ht 5\' 7"  (1.702 m)   Wt 203 lb 6.4 oz (92.3 kg)   LMP 04/09/2021 (Exact Date) Comment: sexually active,btl  SpO2 97%   BMI 31.86 kg/m  General Appearance: Well nourished, in no apparent distress. Head:   Normocephalic and atraumatic. Eyes:  sclerae anicteric,conjunctive pink  Respiratory: Respiratory effort normal, BS equal bilaterally without rales, rhonchi, wheezing. Cardio: RRR with no MRGs. Peripheral pulses intact.   Abdomen: Soft,  Obese ,active bowel sounds. mild tenderness in the epigastrium. Without guarding and Without rebound. No masses. Rectal: Normal external rectal exam, normal rectal tone, no internal hemorrhoids appreciated but has 2 nodular hard non tender 1- 2cm masses anterior/right, scant brown stool, hemoccult slighlty Positive Musculoskeletal: Full ROM, Normal gait. Without edema. Skin:  Dry and intact without significant lesions or rashes Neuro: Alert and  oriented x4;  No focal deficits. Psych:  Cooperative. Normal mood and affect.    Doree Albee, PA-C 10:28 AM

## 2022-11-01 ENCOUNTER — Other Ambulatory Visit (HOSPITAL_BASED_OUTPATIENT_CLINIC_OR_DEPARTMENT_OTHER): Payer: Self-pay

## 2022-11-01 ENCOUNTER — Other Ambulatory Visit (INDEPENDENT_AMBULATORY_CARE_PROVIDER_SITE_OTHER): Payer: 59

## 2022-11-01 ENCOUNTER — Ambulatory Visit (INDEPENDENT_AMBULATORY_CARE_PROVIDER_SITE_OTHER): Payer: 59 | Admitting: Physician Assistant

## 2022-11-01 ENCOUNTER — Encounter: Payer: Self-pay | Admitting: Physician Assistant

## 2022-11-01 VITALS — BP 126/80 | HR 80 | Ht 67.0 in | Wt 203.4 lb

## 2022-11-01 DIAGNOSIS — R1030 Lower abdominal pain, unspecified: Secondary | ICD-10-CM

## 2022-11-01 DIAGNOSIS — N938 Other specified abnormal uterine and vaginal bleeding: Secondary | ICD-10-CM | POA: Diagnosis not present

## 2022-11-01 DIAGNOSIS — K219 Gastro-esophageal reflux disease without esophagitis: Secondary | ICD-10-CM

## 2022-11-01 DIAGNOSIS — D509 Iron deficiency anemia, unspecified: Secondary | ICD-10-CM | POA: Diagnosis not present

## 2022-11-01 DIAGNOSIS — R195 Other fecal abnormalities: Secondary | ICD-10-CM

## 2022-11-01 LAB — CBC WITH DIFFERENTIAL/PLATELET
Basophils Absolute: 0.1 10*3/uL (ref 0.0–0.1)
Basophils Relative: 1.4 % (ref 0.0–3.0)
Eosinophils Absolute: 0.1 10*3/uL (ref 0.0–0.7)
Eosinophils Relative: 2.1 % (ref 0.0–5.0)
HCT: 39.8 % (ref 36.0–46.0)
Hemoglobin: 12.6 g/dL (ref 12.0–15.0)
Lymphocytes Relative: 22.3 % (ref 12.0–46.0)
Lymphs Abs: 1.4 10*3/uL (ref 0.7–4.0)
MCHC: 31.7 g/dL (ref 30.0–36.0)
MCV: 73.8 fl — ABNORMAL LOW (ref 78.0–100.0)
Monocytes Absolute: 0.6 10*3/uL (ref 0.1–1.0)
Monocytes Relative: 10.6 % (ref 3.0–12.0)
Neutro Abs: 3.9 10*3/uL (ref 1.4–7.7)
Neutrophils Relative %: 63.6 % (ref 43.0–77.0)
Platelets: 254 10*3/uL (ref 150.0–400.0)
RBC: 5.39 Mil/uL — ABNORMAL HIGH (ref 3.87–5.11)
RDW: 25.5 % — ABNORMAL HIGH (ref 11.5–15.5)
WBC: 6.1 10*3/uL (ref 4.0–10.5)

## 2022-11-01 LAB — IBC + FERRITIN
Ferritin: 7.1 ng/mL — ABNORMAL LOW (ref 10.0–291.0)
Iron: 58 ug/dL (ref 42–145)
Saturation Ratios: 15.2 % — ABNORMAL LOW (ref 20.0–50.0)
TIBC: 380.8 ug/dL (ref 250.0–450.0)
Transferrin: 272 mg/dL (ref 212.0–360.0)

## 2022-11-01 MED ORDER — NA SULFATE-K SULFATE-MG SULF 17.5-3.13-1.6 GM/177ML PO SOLN
1.0000 | Freq: Once | ORAL | 0 refills | Status: AC
  Filled 2022-11-01: qty 354, 1d supply, fill #0

## 2022-11-01 NOTE — Patient Instructions (Signed)
_______________________________________________________  If your blood pressure at your visit was 140/90 or greater, please contact your primary care physician to follow up on this.  _______________________________________________________  If you are age 57 or older, your body mass index should be between 23-30. Your Body mass index is 31.86 kg/m. If this is out of the aforementioned range listed, please consider follow up with your Primary Care Provider.  If you are age 57 or younger, your body mass index should be between 19-25. Your Body mass index is 31.86 kg/m. If this is out of the aformentioned range listed, please consider follow up with your Primary Care Provider.   ________________________________________________________  The [facility] providers would like to encourage you to use [facility] to communicate with providers for non-urgent requests or questions.  Due to long hold times on the telephone, sending your provider a message by [facility] may be a faster and more efficient way to get a response.  Please allow 48 business hours for a response.  Please remember that this is for non-urgent requests.  _______________________________________________________  Bonita Quin have been scheduled for an endoscopy and colonoscopy. Please follow the written instructions given to you at your visit today. Please pick up your prep supplies at the pharmacy within the next 1-3 days. If you use inhalers (even only as needed), please bring them with you on the day of your procedure.

## 2022-11-01 NOTE — Progress Notes (Signed)
Attending Physician's Attestation   I have reviewed the chart.   I agree with the Advanced Practitioner's note, impression, and recommendations with any updates as below. Reasonable for upper and lower endoscopic evaluation.  If unremarkable, then will need video capsule endoscopy.   Corliss Parish, MD [facility] Advanced Endoscopy Office # [phone]

## 2022-11-02 LAB — TISSUE TRANSGLUTAMINASE, IGA: (tTG) Ab, IgA: <1 U/mL

## 2022-11-02 LAB — IGA: Immunoglobulin A: 195 mg/dL (ref 47–310)

## 2022-11-03 ENCOUNTER — Ambulatory Visit (INDEPENDENT_AMBULATORY_CARE_PROVIDER_SITE_OTHER): Payer: 59

## 2022-11-03 ENCOUNTER — Ambulatory Visit (INDEPENDENT_AMBULATORY_CARE_PROVIDER_SITE_OTHER): Payer: 59 | Admitting: Obstetrics & Gynecology

## 2022-11-03 ENCOUNTER — Encounter: Payer: Self-pay | Admitting: Obstetrics & Gynecology

## 2022-11-03 VITALS — BP 114/72 | HR 75

## 2022-11-03 DIAGNOSIS — N950 Postmenopausal bleeding: Secondary | ICD-10-CM

## 2022-11-03 NOTE — Progress Notes (Signed)
    Erica Hull 1966-03-21 [id number]        57 y.o.  [id number] S/P BTL  RP: PMB for Pelvic US  HPI:  Menopausal, well on no HRT.  Had LPM 03/2021.  Mild occasional hot flushes.  Felt pelvic cramps and started having vaginal bleeding like a mild period 4/11-06/2023. No PMB since then.  EBx Benign on 10/21/22.  Urine/BMs normal.    OB History  Gravida Para Term Preterm AB Living  SAB IAB Ectopic Multiple Live Births          2    # Outcome Date GA Lbr Len/2nd Weight Sex Delivery Anes PTL Lv  4 AB           3 AB           2 Term     F CS-Unspec  N LIV  1 Term     M CS-Unspec  N LIV    Past medical history,surgical history, problem list, medications, allergies, family history and social history were all reviewed and documented in the EPIC chart.   Directed ROS with pertinent positives and negatives documented in the history of present illness/assessment and plan.  Exam:  Vitals:   11/03/22 1455  BP: 114/72  Pulse: 75  SpO2: 99%   General appearance:  Normal  Pelvic US today: T/V images.  No latex used.  Anteverted uterus slightly enlarged with heterogenous texture.  Venetian blind pattern compatible with adenomyosis.  No myometrial mass.  The overall uterine size is measured at 9.99 x 7.67 x 6.77 cm.  The endometrial lining is thin and symmetrical with no mass or thickening seen.  The endometrial lining is measured at 2.94 mm.  Both ovaries are mobile, normal in size with normal perfusion bilaterally.  No adnexal mass.  No free fluid in the pelvis.  EBx 10/21/22: FINAL MICROSCOPIC DIAGNOSIS:   A. ENDOMETRIUM, BIOPSY:  Fragmented benign endometrium with mixed hormone effect  Extensive stromal breakdown consistent with bleeding  Focal squamous morular metaplasia  Negative for polyp, atypia, hyperplasia and carcinoma    Assessment/Plan:  57 y.o. [id number]   1. Postmenopausal bleeding  Menopausal, well on no HRT.  Had LPM 03/2021.  Mild occasional hot flushes.   Felt pelvic cramps and started having vaginal bleeding like a mild period 4/11-06/2023. No PMB since then.  EBx Benign on 10/21/22.  Urine/BMs normal.  Pelvic US today showing a thin and normal endometrial line at 2.94 mm.  Uterus and ovaries normal.  No FF.  Patient reassured.  PMB precautions reviewed.  F/U next week for Annual Gyn exam.  Genia Del MD, 3:03 PM 11/03/2022

## 2022-11-04 ENCOUNTER — Other Ambulatory Visit (HOSPITAL_BASED_OUTPATIENT_CLINIC_OR_DEPARTMENT_OTHER): Payer: Self-pay

## 2022-11-11 ENCOUNTER — Ambulatory Visit (INDEPENDENT_AMBULATORY_CARE_PROVIDER_SITE_OTHER): Payer: 59 | Admitting: Obstetrics & Gynecology

## 2022-11-11 ENCOUNTER — Encounter: Payer: Self-pay | Admitting: Obstetrics & Gynecology

## 2022-11-11 ENCOUNTER — Other Ambulatory Visit (HOSPITAL_COMMUNITY)
Admission: RE | Admit: 2022-11-11 | Discharge: 2022-11-11 | Disposition: A | Discharge status: Home / Self Care | Payer: 59 | Source: Ambulatory Visit | Attending: Obstetrics & Gynecology | Admitting: Obstetrics & Gynecology

## 2022-11-11 VITALS — BP 110/76 | HR 73 | Ht 65.0 in | Wt 200.0 lb

## 2022-11-11 DIAGNOSIS — Z780 Asymptomatic menopausal state: Secondary | ICD-10-CM

## 2022-11-11 DIAGNOSIS — Z01419 Encounter for gynecological examination (general) (routine) without abnormal findings: Secondary | ICD-10-CM | POA: Diagnosis not present

## 2022-11-11 DIAGNOSIS — Z9851 Tubal ligation status: Secondary | ICD-10-CM

## 2022-11-11 NOTE — Progress Notes (Signed)
Erica Hull 01-20-1966 [id number]   History:    57 y.o.  [id number] Married.  S/P TL.  Son 48 yo moved out of the house, daughter 45 yo at [employer].   RP:  Established patient presenting for annual gyn exam    HPI: Postmenopause, well on no HRT.  No PMB currently.  Recent light vaginal spotting/discharge.  Pelvic US 11/03/22 showed the endometrial lining thin and symmetrical with no mass or thickening seen, measured at 2.94 mm. No pain with IC. Pap Neg 03/2020. Pap reflex today. S/P TL. Urine/BMs wnl.  Breasts normal. Mammo 03/2022 Neg.  BMI 33.28. Good fitness. Fasting Health Labs with Fam NP 09/2022 and 10/2022. Colonoscopy Normal in 08/2018.   Past medical history,surgical history, family history and social history were all reviewed and documented in the EPIC chart.  Gynecologic History Patient's last menstrual period was 04/09/2021 (exact date).  Obstetric History OB History  Gravida Para Term Preterm AB Living  4 2 2   2 2   SAB IAB Ectopic Multiple Live Births          2    # Outcome Date GA Lbr Len/2nd Weight Sex Delivery Anes PTL Lv  4 AB           3 AB           2 Term     F CS-Unspec  N LIV  1 Term     M CS-Unspec  N LIV     ROS: A ROS was performed and pertinent positives and negatives are included in the history. GENERAL: No fevers or chills. HEENT: No change in vision, no earache, sore throat or sinus congestion. NECK: No pain or stiffness. CARDIOVASCULAR: No chest pain or pressure. No palpitations. PULMONARY: No shortness of breath, cough or wheeze. GASTROINTESTINAL: No abdominal pain, nausea, vomiting or diarrhea, melena or bright red blood per rectum. GENITOURINARY: No urinary frequency, urgency, hesitancy or dysuria. MUSCULOSKELETAL: No joint or muscle pain, no back pain, no recent trauma. DERMATOLOGIC: No rash, no itching, no lesions. ENDOCRINE: No polyuria, polydipsia, no heat or cold intolerance. No recent change in weight. HEMATOLOGICAL: No anemia or easy  bruising or bleeding. NEUROLOGIC: No headache, seizures, numbness, tingling or weakness. PSYCHIATRIC: No depression, no loss of interest in normal activity or change in sleep pattern.     Exam:   BP 110/76   Pulse 73   Ht 5\' 5"  (1.651 m)   Wt 200 lb (90.7 kg)   LMP 04/09/2021 (Exact Date) Comment: sexually active,btl  SpO2 98%   BMI 33.28 kg/m   Body mass index is 33.28 kg/m.  General appearance : Well developed well nourished female. No acute distress HEENT: Eyes: no retinal hemorrhage or exudates,  Neck supple, trachea midline, no carotid bruits, no thyroidmegaly Lungs: Clear to auscultation, no rhonchi or wheezes, or rib retractions  Heart: Regular rate and rhythm, no murmurs or gallops Breast:Examined in sitting and supine position were symmetrical in appearance, no palpable masses or tenderness,  no skin retraction, no nipple inversion, no nipple discharge, no skin discoloration, no axillary or supraclavicular lymphadenopathy Abdomen: no palpable masses or tenderness, no rebound or guarding Extremities: no edema or skin discoloration or tenderness  Pelvic: Vulva: Normal             Vagina: No gross lesions or discharge  Cervix: No gross lesions or discharge.  Pap reflex done.  Uterus  AV, normal size, shape and consistency, non-tender and mobile  Adnexa  Without masses or tenderness  Anus: Normal   Assessment/Plan:  57 y.o. female for annual exam   1. Encounter for routine gynecological examination with Papanicolaou smear of cervix Postmenopause, well on no HRT.  No PMB currently.  Recent light vaginal spotting/discharge.  Pelvic US 11/03/22 showed the endometrial lining thin and symmetrical with no mass or thickening seen, measured at 2.94 mm. No pain with IC. Pap Neg 03/2020. Pap reflex today. S/P TL. Urine/BMs wnl.  Breasts normal. Mammo 03/2022 Neg.  BMI 33.28. Good fitness. Fasting Health Labs with Fam NP 09/2022 and 10/2022. Colonoscopy Normal in 08/2018. - Cytology - PAP(  [place])  2. S/P tubal ligation  3. Postmenopause  Postmenopause, well on no HRT.  No PMB currently.  Recent light vaginal spotting/discharge.  Pelvic US 11/03/22 showed the endometrial lining thin and symmetrical with no mass or thickening seen, measured at 2.94 mm. No pain with IC.   Genia Del MD, 9:31 AM

## 2022-11-15 ENCOUNTER — Other Ambulatory Visit (HOSPITAL_BASED_OUTPATIENT_CLINIC_OR_DEPARTMENT_OTHER): Payer: Self-pay

## 2022-11-15 ENCOUNTER — Other Ambulatory Visit: Payer: Self-pay

## 2022-11-15 DIAGNOSIS — B9689 Other specified bacterial agents as the cause of diseases classified elsewhere: Secondary | ICD-10-CM

## 2022-11-15 LAB — CYTOLOGY - PAP: Diagnosis: NEGATIVE

## 2022-11-15 MED ORDER — TINIDAZOLE 500 MG PO TABS
ORAL_TABLET | ORAL | 0 refills | Status: DC
  Filled 2022-11-15: qty 8, 2d supply, fill #0

## 2022-11-22 ENCOUNTER — Encounter: Payer: 59 | Admitting: Plastic Surgery

## 2022-11-28 ENCOUNTER — Other Ambulatory Visit (HOSPITAL_BASED_OUTPATIENT_CLINIC_OR_DEPARTMENT_OTHER): Payer: Self-pay

## 2022-12-08 ENCOUNTER — Encounter: Payer: Self-pay | Admitting: Gastroenterology

## 2022-12-21 ENCOUNTER — Encounter: Payer: Self-pay | Admitting: Gastroenterology

## 2022-12-21 ENCOUNTER — Ambulatory Visit (AMBULATORY_SURGERY_CENTER): Payer: 59 | Admitting: Gastroenterology

## 2022-12-21 VITALS — BP 126/77 | HR 72 | Temp 96.0°F | Resp 15 | Ht 67.0 in | Wt 203.0 lb

## 2022-12-21 DIAGNOSIS — K229 Disease of esophagus, unspecified: Secondary | ICD-10-CM | POA: Diagnosis not present

## 2022-12-21 DIAGNOSIS — D122 Benign neoplasm of ascending colon: Secondary | ICD-10-CM | POA: Diagnosis present

## 2022-12-21 DIAGNOSIS — K2970 Gastritis, unspecified, without bleeding: Secondary | ICD-10-CM | POA: Diagnosis not present

## 2022-12-21 DIAGNOSIS — R195 Other fecal abnormalities: Secondary | ICD-10-CM

## 2022-12-21 DIAGNOSIS — K2951 Unspecified chronic gastritis with bleeding: Secondary | ICD-10-CM | POA: Diagnosis not present

## 2022-12-21 DIAGNOSIS — K635 Polyp of colon: Secondary | ICD-10-CM | POA: Diagnosis not present

## 2022-12-21 DIAGNOSIS — B9681 Helicobacter pylori [H. pylori] as the cause of diseases classified elsewhere: Secondary | ICD-10-CM | POA: Diagnosis not present

## 2022-12-21 DIAGNOSIS — D509 Iron deficiency anemia, unspecified: Secondary | ICD-10-CM | POA: Diagnosis not present

## 2022-12-21 MED ORDER — SODIUM CHLORIDE 0.9 % IV SOLN: 500.0000 mL | Freq: Once | INTRAVENOUS | Status: DC

## 2022-12-21 NOTE — Progress Notes (Signed)
GASTROENTEROLOGY PROCEDURE H&P NOTE   Primary Care Physician: Tollie Eth, NP  HPI: Erica Hull is a 57 y.o. female who presents for EGD/Colonoscopy for IDA evaluation and recent N/V.  Past Medical History:  Diagnosis Date   Anemia    Elective abortion    X2   Enlarged aorta (HCC)    H/O peripheral vascular disease 2006   Heart murmur    Hx of cystitis 2006   Hypertension    pregnancy induced   Hypothyroidism    Missed ab    Past Surgical History:  Procedure Laterality Date   CESAREAN SECTION     CESAREAN SECTION W/BTL     COLONOSCOPY     DILATION AND CURETTAGE OF UTERUS     ENDOMETRIAL ABLATION     HTA   ENDOSCOPIC VEIN LASER TREATMENT     UPPER GASTROINTESTINAL ENDOSCOPY     vein closure procedure Left 07/11/2004   Current Outpatient Medications  Medication Sig Dispense Refill   B Complex-C-Folic Acid (SUPER B COMPLEX/FA/VIT C PO) Take 1 tablet by mouth daily.     Cholecalciferol (D3-1000) 25 MCG (1000 UT) tablet      Cyanocobalamin (VITAMIN B 12 PO) Take 1,000 mcg by mouth daily.     Ginkgo Biloba 120 MG CAPS Take 1 tablet by mouth daily.     levothyroxine (SYNTHROID) 88 MCG tablet Take 1 tab ( ) by mouth 5 days a week and 1.5 tabs ( ) by mouth 2 days a week. 100 tablet 4   metoprolol succinate (TOPROL-XL) 25 MG 24 hr tablet TAKE 1 TABLET BY MOUTH DAILY 90 tablet 1   Omega-3 Fatty Acids (FISH OIL) 1200 MG CAPS Take 1 capsule by mouth daily.     omeprazole (PRILOSEC) 20 MG capsule Take 20 mg by mouth as needed (indigestion).     Rhubarb (ESTROVEN COMPLETE PO) Take 1 capsule by mouth daily.     TURMERIC CURCUMIN PO Take 2,250 mg by mouth daily.     vitamin E 400 UNIT capsule Take 400 Units by mouth daily.     Iron, Ferrous Sulfate, 325 (65 Fe) MG TABS Take 1 tablet by mouth every other day. 45 tablet 3   ondansetron (ZOFRAN) 8 MG tablet Take 1 tablet (8 mg total) by mouth every 8 (eight) hours as needed for nausea or vomiting. 20 tablet 3    Semaglutide-Weight Management (WEGOVY) 2.4 MG/0.75ML SOAJ Inject 2.4 mg into the skin once a week. 9 mL 3   Current Facility-Administered Medications  Medication Dose Route Frequency Provider Last Rate Last Admin   0.9 %  sodium chloride infusion  500 mL Intravenous Once Mansouraty, Netty Starring., MD        Current Outpatient Medications:    B Complex-C-Folic Acid (SUPER B COMPLEX/FA/VIT C PO), Take 1 tablet by mouth daily., Disp: , Rfl:    Cholecalciferol (D3-1000) 25 MCG (1000 UT) tablet, , Disp: , Rfl:    Cyanocobalamin (VITAMIN B 12 PO), Take 1,000 mcg by mouth daily., Disp: , Rfl:    Ginkgo Biloba 120 MG CAPS, Take 1 tablet by mouth daily., Disp: , Rfl:    levothyroxine (SYNTHROID) 88 MCG tablet, Take 1 tab ( ) by mouth 5 days a week and 1.5 tabs ( ) by mouth 2 days a week., Disp: 100 tablet, Rfl: 4   metoprolol succinate (TOPROL-XL) 25 MG 24 hr tablet, TAKE 1 TABLET BY MOUTH DAILY, Disp: 90 tablet, Rfl: 1   Omega-3 Fatty Acids (FISH OIL) 1200 MG  CAPS, Take 1 capsule by mouth daily., Disp: , Rfl:    omeprazole (PRILOSEC) 20 MG capsule, Take 20 mg by mouth as needed (indigestion)., Disp: , Rfl:    Rhubarb (ESTROVEN COMPLETE PO), Take 1 capsule by mouth daily., Disp: , Rfl:    TURMERIC CURCUMIN PO, Take 2,250 mg by mouth daily., Disp: , Rfl:    vitamin E 400 UNIT capsule, Take 400 Units by mouth daily., Disp: , Rfl:    Iron, Ferrous Sulfate, 325 (65 Fe) MG TABS, Take 1 tablet by mouth every other day., Disp: 45 tablet, Rfl: 3   ondansetron (ZOFRAN) 8 MG tablet, Take 1 tablet (8 mg total) by mouth every 8 (eight) hours as needed for nausea or vomiting., Disp: 20 tablet, Rfl: 3   Semaglutide-Weight Management (WEGOVY) 2.4 MG/0.75ML SOAJ, Inject 2.4 mg into the skin once a week., Disp: 9 mL, Rfl: 3  Current Facility-Administered Medications:    0.9 %  sodium chloride infusion, 500 mL, Intravenous, Once, Mansouraty, Netty Starring., MD No Known Allergies Family History  Problem Relation  Age of Onset   Rheum arthritis Mother    COPD Mother    Dementia Mother    Hypertension Father    Cancer Father 66       ureter   Atrial fibrillation Father    Colon polyps Father    Colon cancer Neg Hx    Stomach cancer Neg Hx    Esophageal cancer Neg Hx    Rectal cancer Neg Hx    Social History   Socioeconomic History   Marital status: Married    Spouse name: Not on file   Number of children: 2   Years of education: Not on file   Highest education level: Not on file  Occupational History   Not on file  Tobacco Use   Smoking status: Never   Smokeless tobacco: Never  Vaping Use   Vaping Use: Never used  Substance and Sexual Activity   Alcohol use: Not Currently   Drug use: No   Sexual activity: Yes    Partners: Male    Birth control/protection: Surgical    Comment: 1ST Intercourse- 20, partners- 3, btl  Other Topics Concern   Not on file  Social History Narrative   Not on file   Social Determinants of Health   Financial Resource Strain: Not on file  Food Insecurity: Not on file  Transportation Needs: Not on file  Physical Activity: Not on file  Stress: Not on file  Social Connections: Not on file  Intimate Partner Violence: Not on file    Physical Exam: Today's Vitals   12/21/22 1257  BP: (!) 131/94  Pulse: 74  Temp: (!) 96 F (35.6 C)  TempSrc: Temporal  SpO2: 99%  Weight: 203 lb (92.1 kg)  Height: 5\' 7"  (1.702 m)   Body mass index is 31.79 kg/m. GEN: NAD EYE: Sclerae anicteric ENT: MMM CV: Non-tachycardic GI: Soft, NT/ND NEURO:  Alert & Oriented x 3  Lab Results: No results for input(s): "WBC", "HGB", "HCT", "PLT" in the last 72 hours. BMET No results for input(s): "NA", "K", "CL", "CO2", "GLUCOSE", "BUN", "CREATININE", "CALCIUM" in the last 72 hours. LFT No results for input(s): "PROT", "ALBUMIN", "AST", "ALT", "ALKPHOS", "BILITOT", "BILIDIR", "IBILI" in the last 72 hours. PT/INR No results for input(s): "LABPROT", "INR" in the last  72 hours.   Impression / Plan: This is a 57 y.o.female who presents for EGD/Colonoscopy for IDA evaluation and recent N/V.  The risks  and benefits of endoscopic evaluation/treatment were discussed with the patient and/or family; these include but are not limited to the risk of perforation, infection, bleeding, missed lesions, lack of diagnosis, severe illness requiring hospitalization, as well as anesthesia and sedation related illnesses.  The patient's history has been reviewed, patient examined, no change in status, and deemed stable for procedure.  The patient and/or family is agreeable to proceed.    Corliss Parish, MD [facility] Advanced Endoscopy Office # [phone]

## 2022-12-21 NOTE — Progress Notes (Signed)
Called to room to assist during endoscopic procedure.  Patient ID and intended procedure confirmed with present staff. Received instructions for my participation in the procedure from the performing physician.  

## 2022-12-21 NOTE — Op Note (Signed)
[facility] Patient Name: Erica Hull Procedure Date: 12/21/2022 1:59 PM MRN: [id number] Endoscopist: Corliss Parish , MD, [id number] Age: 57 Referring MD:  Date of Birth: 1965-11-03 Gender: Female Account #: [id number] Procedure:                Upper GI endoscopy Indications:              Iron deficiency anemia, Nausea with vomiting Medicines:                Monitored Anesthesia Care Procedure:                Pre-Anesthesia Assessment:                           - Prior to the procedure, a History and Physical                            was performed, and patient medications and                            allergies were reviewed. The patient's tolerance of                            previous anesthesia was also reviewed. The risks                            and benefits of the procedure and the sedation                            options and risks were discussed with the patient.                            All questions were answered, and informed consent                            was obtained. Prior Anticoagulants: The patient has                            taken no anticoagulant or antiplatelet agents. ASA                            Grade Assessment: III - A patient with severe                            systemic disease. After reviewing the risks and                            benefits, the patient was deemed in satisfactory                            condition to undergo the procedure.                           After obtaining informed consent, the endoscope was  passed under direct vision. Throughout the                            procedure, the patient's blood pressure, pulse, and                            oxygen saturations were monitored continuously. The                            Olympus Scope [id number] was introduced through the                            mouth, and advanced to the second part of duodenum.                             The upper GI endoscopy was accomplished without                            difficulty. The patient tolerated the procedure. Scope In: Scope Out: Findings:                 Mucosal changes including feline appearance,                            circumferential folds and crepe paper esophagus                            were found in the entire esophagus. Esophageal                            findings were graded using the Eosinophilic                            Esophagitis Endoscopic Reference Score (EoE-EREFS)                            as: Edema Grade 0 Normal (distinct vascular                            markings), Rings Grade 1 Mild (subtle                            circumferential ridges seen on esophageal                            distension), Exudates Grade 0 None (no white                            lesions seen), Furrows Grade 1 Mild (vertical lines                            without visible depth) and Stricture present.                            Biopsies were taken  with a cold forceps for                            histology to rule in/out EOE/LOE.                           The Z-line was regular and was found 36 cm from the                            incisors.                           A 2 cm hiatal hernia was present.                           Diffuse granular mucosa was found in the entire                            examined stomach. Biopsies were taken with a cold                            forceps for histology. Biopsies were taken with a                            cold forceps for histology and Helicobacter pylori                            testing.                           No gross lesions were noted in the duodenal bulb,                            in the first portion of the duodenum and in the                            second portion of the duodenum. Biopsies were taken                            with a cold forceps for histology.                           The major  papilla was normal. Complications:            No immediate complications. Estimated Blood Loss:     Estimated blood loss was minimal. 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 Biopsied.                           - Z-line regular, 36 cm from the incisors.                           -  2 cm hiatal hernia.                           - Granular gastric mucosa. Biopsied.                           - No gross lesions in the duodenal bulb, in the                            first portion of the duodenum and in the second                            portion of the duodenum. Biopsied.                           - Normal major papilla. Recommendation:           - Proceed to scheduled colonoscopy.                           - Continue present medications.                           - Await pathology results.                           - Observe patient's clinical course.                           - If evidence of eosinophilic/lymphocytic                            esophagitis is found, will need to discuss things                            further with patient.                           - If patient's symptoms of indigestion/heartburn                            progress will need to consider additional therapies.                           - The findings and recommendations were discussed                            with the patient.                           - The findings and recommendations were discussed                            with the patient's family. Corliss Parish, MD 12/21/2022 2:35:12 PM

## 2022-12-21 NOTE — Patient Instructions (Addendum)
Recommendation:           - The patient will be observed post-procedure,                            until all discharge criteria are met.                           - Discharge patient to home.                           - Patient has a contact number available for                            emergencies. The signs and symptoms of potential                            delayed complications were discussed with the                            patient. Return to normal activities tomorrow.                            Written discharge instructions were provided to the                            patient.                           - High fiber diet.                           - Use FiberCon 1-2 tablets PO daily.                           - Continue present medications.                           - Await pathology results.                           - Repeat colonoscopy in 5/7 years for surveillance                            based on pathology results if adenomatous tissue is                            found.                           - The findings and recommendations were discussed                            with the patient.                           - The findings and recommendations were discussed  with the patient's family.                           - Depending on the patient's iron deficiency anemia                            evaluation moving forward, if there still remains                            concern, we can consider a video capsule endoscopy                            in the future. Recommendation:           - Proceed to scheduled colonoscopy.                           - Continue present medications.                           - Await pathology results.                           - Observe patient's clinical course.                           - If evidence of eosinophilic/lymphocytic                            esophagitis is found, will need to discuss things                             further with patient.                           - If patient's symptoms of indigestion/heartburn                            progress will need to consider additional therapies.                           - The findings and recommendations were discussed                            with the patient.                           - The findings and recommendations were discussed                            with the patient's family.  Handouts on High fiber diet, polyps and hemorrhoids given.  YOU HAD AN ENDOSCOPIC PROCEDURE TODAY AT THE [facility]:   Refer to the procedure report that was given to you for any specific questions about what was found during the examination.  If the procedure report does not answer your questions, please call your gastroenterologist to clarify.  If you requested that  your care partner not be given the details of your procedure findings, then the procedure report has been included in a sealed envelope for you to review at your convenience later.  YOU SHOULD EXPECT: Some feelings of bloating in the abdomen. Passage of more gas than usual.  Walking can help get rid of the air that was put into your GI tract during the procedure and reduce the bloating. If you had a lower endoscopy (such as a colonoscopy or flexible sigmoidoscopy) you may notice spotting of blood in your stool or on the toilet paper. If you underwent a bowel prep for your procedure, you may not have a normal bowel movement for a few days.  Please Note:  You might notice some irritation and congestion in your nose or some drainage.  This is from the oxygen used during your procedure.  There is no need for concern and it should clear up in a day or so.  SYMPTOMS TO REPORT IMMEDIATELY:  Following lower endoscopy (colonoscopy or flexible sigmoidoscopy):  Excessive amounts of blood in the stool  Significant tenderness or worsening of abdominal pains  Swelling of the abdomen  that is new, acute  Fever of 100F or higher  Following upper endoscopy (EGD)  Vomiting of blood or coffee ground material  New chest pain or pain under the shoulder blades  Painful or persistently difficult swallowing  New shortness of breath  Fever of 100F or higher  Black, tarry-looking stools  For urgent or emergent issues, a gastroenterologist can be reached at any hour by calling [phone]. Do not use MyChart messaging for urgent concerns.    DIET:  We do recommend a small meal at first, but then you may proceed to your regular diet.  Drink plenty of fluids but you should avoid alcoholic beverages for 24 hours.  ACTIVITY:  You should plan to take it easy for the rest of today and you should NOT DRIVE or use heavy machinery until tomorrow (because of the sedation medicines used during the test).    FOLLOW UP: Our staff will call the number listed on your records the next business day following your procedure.  We will call around 7:15- 8:00 am to check on you and address any questions or concerns that you may have regarding the information given to you following your procedure. If we do not reach you, we will leave a message.     If any biopsies were taken you will be contacted by phone or by letter within the next 1-3 weeks.  Please call us at [phone] if you have not heard about the biopsies in 3 weeks.    SIGNATURES/CONFIDENTIALITY: You and/or your care partner have signed paperwork which will be entered into your electronic medical record.  These signatures attest to the fact that that the information above on your After Visit Summary has been reviewed and is understood.  Full responsibility of the confidentiality of this discharge information lies with you and/or your care-partner.

## 2022-12-21 NOTE — Progress Notes (Signed)
Report to PACU, RN, vss, BBS= Clear.  

## 2022-12-21 NOTE — Op Note (Signed)
[facility] Patient Name: Erica Hull Procedure Date: 12/21/2022 1:58 PM MRN: [id number] Endoscopist: Corliss Parish , MD, [id number] Age: 57 Referring MD:  Date of Birth: [birth year] Gender: Female Account #: [id number] Procedure:                Colonoscopy Indications:              Iron deficiency anemia Medicines:                Monitored Anesthesia Care Procedure:                Pre-Anesthesia Assessment:                           - Prior to the procedure, a History and Physical                            was performed, and patient medications and                            allergies were reviewed. The patient's tolerance of                            previous anesthesia was also reviewed. The risks                            and benefits of the procedure and the sedation                            options and risks were discussed with the patient.                            All questions were answered, and informed consent                            was obtained. Prior Anticoagulants: The patient has                            taken no anticoagulant or antiplatelet agents. ASA                            Grade Assessment: III - A patient with severe                            systemic disease. After reviewing the risks and                            benefits, the patient was deemed in satisfactory                            condition to undergo the procedure.                           After obtaining informed consent, the colonoscope  was passed under direct vision. Throughout the                            procedure, the patient's blood pressure, pulse, and                            oxygen saturations were monitored continuously. The                            Olympus CF-HQ190L [id number]) Colonoscope was                            introduced through the anus and advanced to the 3                            cm into the ileum. The  colonoscopy was performed                            without difficulty. The patient tolerated the                            procedure. The quality of the bowel preparation was                            good. The terminal ileum, ileocecal valve,                            appendiceal orifice, and rectum were photographed. Scope In: 2:16:06 PM Scope Out: 2:28:48 PM Scope Withdrawal Time: 0 hours 9 minutes 27 seconds  Total Procedure Duration: 0 hours 12 minutes 42 seconds  Findings:                 The digital rectal exam findings include                            hemorrhoids. Pertinent negatives include no                            palpable rectal lesions.                           The terminal ileum and ileocecal valve appeared                            normal.                           A 4 mm polyp was found in the ascending colon. The                            polyp was sessile. The polyp was removed with a                            cold snare. Resection and retrieval were complete.  Normal mucosa was found in the entire colon                            otherwise.                           Non-bleeding non-thrombosed external and internal                            hemorrhoids were found during retroflexion, during                            perianal exam and during digital exam. The                            hemorrhoids were Grade II (internal hemorrhoids                            that prolapse but reduce spontaneously). Complications:            No immediate complications. Estimated Blood Loss:     Estimated blood loss was minimal. Impression:               - Hemorrhoids found on digital rectal exam.                           - The examined portion of the ileum was normal.                           - One 4 mm polyp in the ascending colon, removed                            with a cold snare. Resected and retrieved.                           -  Normal mucosa in the entire examined colon                            otherwise.                           - Non-bleeding non-thrombosed external and internal                            hemorrhoids. Recommendation:           - The patient will be observed post-procedure,                            until all discharge criteria are met.                           - Discharge patient to home.                           - Patient has a contact number available for  emergencies. The signs and symptoms of potential                            delayed complications were discussed with the                            patient. Return to normal activities tomorrow.                            Written discharge instructions were provided to the                            patient.                           - High fiber diet.                           - Use FiberCon 1-2 tablets PO daily.                           - Continue present medications.                           - Await pathology results.                           - Repeat colonoscopy in 5/7 years for surveillance                            based on pathology results if adenomatous tissue is                            found.                           - The findings and recommendations were discussed                            with the patient.                           - The findings and recommendations were discussed                            with the patient's family.                           - Depending on the patient's iron deficiency anemia                            evaluation moving forward, if there still remains                            concern, we can consider a video capsule endoscopy  in the future. Corliss Parish, MD 12/21/2022 2:38:30 PM

## 2022-12-22 ENCOUNTER — Telehealth: Payer: Self-pay

## 2022-12-22 ENCOUNTER — Other Ambulatory Visit (HOSPITAL_BASED_OUTPATIENT_CLINIC_OR_DEPARTMENT_OTHER): Payer: Self-pay | Admitting: Family

## 2022-12-22 DIAGNOSIS — R002 Palpitations: Secondary | ICD-10-CM

## 2022-12-22 NOTE — Telephone Encounter (Signed)
Please call pt to schedule overdue 1 yr f/u appointment with Gillian Shields, NP for refills. Thank you!

## 2022-12-22 NOTE — Telephone Encounter (Signed)
  Follow up Call-     12/21/2022   12:59 PM  Call back number  Post procedure Call Back phone  # [phone]  Permission to leave phone message Yes     Patient questions:  Do you have a fever, pain , or abdominal swelling? No. Pain Score  0 *  Have you tolerated food without any problems? Yes.    Have you been able to return to your normal activities? Yes.    Do you have any questions about your discharge instructions: Diet   No. Medications  No. Follow up visit  No.  Do you have questions or concerns about your Care? No.  Actions: * If pain score is 4 or above: No action needed, pain <4.

## 2022-12-23 NOTE — Telephone Encounter (Signed)
Left message for patient to call and schedule overdue follow up with Gillian Shields, NP for medication refills

## 2022-12-26 ENCOUNTER — Encounter: Payer: Self-pay | Admitting: Gastroenterology

## 2022-12-27 ENCOUNTER — Encounter: Payer: Self-pay | Admitting: Gastroenterology

## 2022-12-27 ENCOUNTER — Other Ambulatory Visit (HOSPITAL_BASED_OUTPATIENT_CLINIC_OR_DEPARTMENT_OTHER): Payer: Self-pay

## 2022-12-27 ENCOUNTER — Other Ambulatory Visit: Payer: Self-pay

## 2022-12-27 DIAGNOSIS — A048 Other specified bacterial intestinal infections: Secondary | ICD-10-CM

## 2022-12-27 DIAGNOSIS — D509 Iron deficiency anemia, unspecified: Secondary | ICD-10-CM

## 2022-12-27 MED ORDER — DOXYCYCLINE HYCLATE 100 MG PO CAPS
100.0000 mg | ORAL_CAPSULE | Freq: Two times a day (BID) | ORAL | 0 refills | Status: AC
  Filled 2022-12-27: qty 28, 14d supply, fill #0

## 2022-12-27 MED ORDER — METRONIDAZOLE 250 MG PO TABS
250.0000 mg | ORAL_TABLET | Freq: Four times a day (QID) | ORAL | 0 refills | Status: AC
  Filled 2022-12-27: qty 56, 14d supply, fill #0

## 2022-12-27 MED ORDER — BISMUTH SUBSALICYLATE 262 MG PO TABS
2.0000 | ORAL_TABLET | Freq: Four times a day (QID) | ORAL | 0 refills | Status: AC
  Filled 2022-12-27: qty 120, 15d supply, fill #0

## 2022-12-27 NOTE — Telephone Encounter (Signed)
Rx request sent to pharmacy.  

## 2022-12-27 NOTE — Telephone Encounter (Signed)
Scheduled 02/14/23 with Gillian Shields, NP

## 2023-01-02 NOTE — Telephone Encounter (Signed)
Erica Hull since Dr Meridee Score is off all week can you please review for me.  She wants to change the doxy to something else because of the sun warning and she is going on vacation.

## 2023-02-03 ENCOUNTER — Other Ambulatory Visit (HOSPITAL_BASED_OUTPATIENT_CLINIC_OR_DEPARTMENT_OTHER): Payer: Self-pay

## 2023-02-03 ENCOUNTER — Encounter (HOSPITAL_BASED_OUTPATIENT_CLINIC_OR_DEPARTMENT_OTHER): Payer: Self-pay

## 2023-02-04 ENCOUNTER — Other Ambulatory Visit (HOSPITAL_BASED_OUTPATIENT_CLINIC_OR_DEPARTMENT_OTHER): Payer: Self-pay

## 2023-02-07 ENCOUNTER — Other Ambulatory Visit (HOSPITAL_BASED_OUTPATIENT_CLINIC_OR_DEPARTMENT_OTHER): Payer: Self-pay

## 2023-02-07 ENCOUNTER — Telehealth: Payer: Self-pay

## 2023-02-07 NOTE — Telephone Encounter (Signed)
Error

## 2023-02-09 ENCOUNTER — Other Ambulatory Visit (HOSPITAL_BASED_OUTPATIENT_CLINIC_OR_DEPARTMENT_OTHER): Payer: Self-pay

## 2023-02-09 ENCOUNTER — Encounter: Payer: Self-pay | Admitting: Nurse Practitioner

## 2023-02-10 ENCOUNTER — Encounter: Payer: Self-pay | Admitting: Plastic Surgery

## 2023-02-10 ENCOUNTER — Ambulatory Visit: Payer: 59 | Admitting: Plastic Surgery

## 2023-02-10 ENCOUNTER — Other Ambulatory Visit: Payer: Self-pay

## 2023-02-10 VITALS — BP 138/88 | HR 72 | Ht 67.0 in | Wt 194.8 lb

## 2023-02-10 DIAGNOSIS — M546 Pain in thoracic spine: Secondary | ICD-10-CM | POA: Diagnosis not present

## 2023-02-10 DIAGNOSIS — Z6830 Body mass index (BMI) 30.0-30.9, adult: Secondary | ICD-10-CM

## 2023-02-10 DIAGNOSIS — N62 Hypertrophy of breast: Secondary | ICD-10-CM | POA: Diagnosis not present

## 2023-02-10 DIAGNOSIS — G8929 Other chronic pain: Secondary | ICD-10-CM

## 2023-02-10 NOTE — Progress Notes (Signed)
   Subjective:    Patient ID: Erica Hull, female    DOB: 03-06-1966, 57 y.o.   MRN: [id number]  The patient is a 57 year old female here for follow-up for breast reduction.  She complains of pain in the neck and back area.  This is unrelieved with conservative treatment.  She is 5 feet 7 inches tall and weighs 194 pounds with a BMI of 30.4 kg/m.  This is down about 10 pounds from her previous visit.  The estimated amount of tissue to be removed is 650 g per breast.  Her mammogram is due in September and she is planning on getting that in time for surgery.  The other irritations and complications have remained the same and not improved over the past several months.    Review of Systems  Constitutional: Negative.   HENT: Negative.    Eyes: Negative.   Respiratory: Negative.    Cardiovascular: Negative.   Gastrointestinal: Negative.   Endocrine: Negative.   Genitourinary: Negative.   Musculoskeletal: Negative.         Objective:   Physical Exam Vitals and nursing note reviewed.  Constitutional:      Appearance: Normal appearance.  HENT:     Head: Normocephalic and atraumatic.  Cardiovascular:     Rate and Rhythm: Normal rate.     Pulses: Normal pulses.  Pulmonary:     Effort: Pulmonary effort is normal.  Abdominal:     Palpations: Abdomen is soft.  Skin:    General: Skin is warm.     Capillary Refill: Capillary refill takes less than 2 seconds.  Neurological:     Mental Status: She is alert and oriented to person, place, and time.  Psychiatric:        Mood and Affect: Mood normal.        Behavior: Behavior normal.        Thought Content: Thought content normal.        Judgment: Judgment normal.         Assessment & Plan:  Symptomatic mammary hypertrophy - Plan: Ambulatory Referral For Surgery Scheduling  Chronic bilateral thoracic back pain Pictures were obtained of the patient and placed in the chart with the patient's or guardian's permission.  Patient  would like to move ahead with bilateral breast reduction with possible liposuction.  She understands the we are required to take off the side amount of volume.  Risks and complications were reviewed.

## 2023-02-12 ENCOUNTER — Other Ambulatory Visit (HOSPITAL_BASED_OUTPATIENT_CLINIC_OR_DEPARTMENT_OTHER): Payer: Self-pay

## 2023-02-12 ENCOUNTER — Telehealth: Payer: Self-pay | Admitting: Nurse Practitioner

## 2023-02-12 NOTE — Telephone Encounter (Signed)
P.A. WEGOVY approved

## 2023-02-14 ENCOUNTER — Ambulatory Visit (INDEPENDENT_AMBULATORY_CARE_PROVIDER_SITE_OTHER): Payer: 59 | Admitting: Family

## 2023-02-14 ENCOUNTER — Encounter (HOSPITAL_BASED_OUTPATIENT_CLINIC_OR_DEPARTMENT_OTHER): Payer: Self-pay | Admitting: Family

## 2023-02-14 ENCOUNTER — Other Ambulatory Visit (HOSPITAL_BASED_OUTPATIENT_CLINIC_OR_DEPARTMENT_OTHER): Payer: Self-pay

## 2023-02-14 VITALS — BP 124/80 | HR 65 | Ht 67.0 in | Wt 198.1 lb

## 2023-02-14 DIAGNOSIS — E039 Hypothyroidism, unspecified: Secondary | ICD-10-CM | POA: Diagnosis not present

## 2023-02-14 DIAGNOSIS — I77810 Thoracic aortic ectasia: Secondary | ICD-10-CM | POA: Diagnosis not present

## 2023-02-14 DIAGNOSIS — R002 Palpitations: Secondary | ICD-10-CM

## 2023-02-14 MED ORDER — METOPROLOL SUCCINATE ER 25 MG PO TB24
25.0000 mg | ORAL_TABLET | Freq: Every day | ORAL | 3 refills | Status: DC
Start: 2023-02-14 — End: 2023-03-22
  Filled 2023-02-14: qty 135, 90d supply, fill #0

## 2023-02-14 NOTE — Progress Notes (Signed)
Cardiology Office Note:  .   Date:  02/14/2023  ID:  Erica Hull, DOB 1966-01-21, MRN [id number] PCP: Tollie Eth, NP  [facility] Providers Cardiologist:  None Cardiology APP:  Alver Sorrow, NP    History of Present Illness: .   Erica Hull is a 57 y.o. 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   She previously followed with Norma Fredrickson, NP.  Lawson Fiscal also cared for her father and mother.  She wore a monitor due to palpitations and had an echocardiogram due to a previously reported murmur.  Monitor revealed 2 runs of NSVT and metoprolol added with improvement in symptoms.  Echocardiogram 03/2019 showed normal LVEF 60 to 65%, mild dilation ascending aorta 40 mm.  Subsequent echo 03/2019 with ascending thoracic aorta maximum measured transverse diameter 3.8 x 3.1 cm, small hiatal hernia.  Given aorta prominent in size according to age plan for repeat study in 2 years. Seen 07/2021 doing well from cardiac perspective. Updated CT aorta 08/20/2021 with stable 3.8 cm ascending thoracic aorta.  No follow-up imaging recommended.  Recently enjoyed a girls trip to [place] with her daughter who is studying psychology at [employer]. Successful 40 lbs weight loss on Wegovy. Reports no shortness of breath nor dyspnea on exertion. Reports no chest pain, pressure, or tightness. No edema, orthopnea, PND. More PVC over the last 2-3 weeks. No recent stressors. Does endorse could hydrate more. Drinks one cup of coffee per day. Last week almost every day while at work but over the weekend did not notice any.   ROS: Please see the history of present illness.    All other systems reviewed and are negative.   Studies Reviewed: Marland Kitchen   EKG Interpretation Date/Time:  Tuesday February 14 2023 10:42:17 EDT Ventricular Rate:  65 PR Interval:  148 QRS Duration:  84 QT Interval:  356 QTC Calculation: 370 R Axis:   36  Text Interpretation: Normal sinus  rhythm  No acute ST/T wave changes. Confirmed by Gillian Shields ([id number]) on 02/14/2023 10:49:47 AM    Cardiac Studies & Procedures       ECHOCARDIOGRAM  ECHOCARDIOGRAM COMPLETE 03/22/2019  Narrative ECHOCARDIOGRAM REPORT    Patient Name:   Erica Hull Date of Exam: 03/22/2019 Medical Rec #:  [id number]       Height:       67.0 in Accession #:    [id number]      Weight:       215.0 lb Date of Birth:  10/17/1965      BSA:          2.09 m Patient Age:    52 years        BP:           125/66 mmHg Patient Gender: F               HR:           66 bpm. Exam Location:  [address]   Procedure: 2D Echo, 3D Echo, Cardiac Doppler, Color Doppler and Strain Analysis  Indications:    R01.1 Murmur R00.2 Palpitations  History:        Patient has no prior history of Echocardiogram examinations. Risk Factors: Hypertension. Murmur. Hypothyroidism.  Sonographer:    NaTashia Rodgers-Jones RDCS Referring Phys: [id number] LORI C GERHARDT  IMPRESSIONS   1. The left ventricle has normal systolic function with an ejection fraction of 60-65%. The cavity size was  normal. Left ventricular diastolic parameters were normal. No evidence of left ventricular regional wall motion abnormalities. 2. The right ventricle has normal systolic function. The cavity was normal. There is no increase in right ventricular wall thickness. Right ventricular systolic pressure could not be assessed. 3. Trivial pericardial effusion is present. 4. No evidence of mitral valve stenosis. 5. The aortic valve is tricuspid. No stenosis of the aortic valve. 6. The aorta is normal unless otherwise noted. 7. There is mild dilatation of the ascending aorta measuring 40 mm.  FINDINGS Left Ventricle: The left ventricle has normal systolic function, with an ejection fraction of 60-65%. The cavity size was normal. There is no increase in left ventricular wall thickness. Left ventricular diastolic parameters were normal. No evidence of left  ventricular regional wall motion abnormalities..  Right Ventricle: The right ventricle has normal systolic function. The cavity was normal. There is no increase in right ventricular wall thickness. Right ventricular systolic pressure could not be assessed.  Left Atrium: Left atrial size was normal in size.  Right Atrium: Right atrial size was normal in size.  Interatrial Septum: No atrial level shunt detected by color flow Doppler.  Pericardium: Trivial pericardial effusion is present.  Mitral Valve: The mitral valve is normal in structure. Mitral valve regurgitation is mild by color flow Doppler. No evidence of mitral valve stenosis.  Tricuspid Valve: The tricuspid valve is normal in structure. Tricuspid valve regurgitation was not visualized by color flow Doppler.  Aortic Valve: The aortic valve is tricuspid Aortic valve regurgitation was not visualized by color flow Doppler. There is No stenosis of the aortic valve.  Pulmonic Valve: The pulmonic valve was grossly normal. Pulmonic valve regurgitation is not visualized by color flow Doppler. No evidence of pulmonic stenosis.  Aorta: The aorta is normal unless otherwise noted. There is mild dilatation of the ascending aorta measuring 40 mm.  Pulmonary Artery: The pulmonary artery is of normal size and origin.  Venous: The inferior vena cava is normal in size with greater than 50% respiratory variability.   +--------------+--------++ LEFT VENTRICLE         +----------------+---------++ +--------------+--------++ Diastology                PLAX 2D                +----------------+---------++ +--------------+--------++ LV e' lateral:  9.57 cm/s LVIDd:        4.80 cm  +----------------+---------++ +--------------+--------++ LV E/e' lateral:9.3       LVIDs:        3.30 cm  +----------------+---------++ +--------------+--------++ LV e' medial:   7.72 cm/s LV PW:        0.90 cm   +----------------+---------++ +--------------+--------++ LV E/e' medial: 11.5      LV IVS:       0.80 cm  +----------------+---------++ +--------------+--------++ LVOT diam:    1.90 cm  +----------------------+-------++ +--------------+--------++ 2D Longitudinal Strain        LV SV:        63 ml    +----------------------+-------++ +--------------+--------++ 2D Strain GLS (A2C):  -18.0 % LV SV Index:  29.04    +----------------------+-------++ +--------------+--------++ 2D Strain GLS (A3C):  -18.7 % LVOT Area:    2.84 cm +----------------------+-------++ +--------------+--------++ 2D Strain GLS (A4C):  -18.5 %                        +----------------------+-------++ +--------------+--------++ 2D Strain GLS Avg:    -18.4 % +----------------------+-------++  +-------------+------++ 3D Volume  EF:       +-------------+------++ 3D EF:       63 %   +-------------+------++ LV EDV:      129 ml +-------------+------++ LV ESV:      48 ml  +-------------+------++ LV SV:       81 ml  +-------------+------++  +---------------+----------++ RIGHT VENTRICLE           +---------------+----------++ RV Basal diam: 3.80 cm    +---------------+----------++ RV S prime:    15.55 cm/s +---------------+----------++ TAPSE (M-mode):2.2 cm     +---------------+----------++  +---------------+-------++-----------++ LEFT ATRIUM           Index       +---------------+-------++-----------++ LA diam:       3.90 cm1.87 cm/m  +---------------+-------++-----------++ LA Vol (A2C):  50.3 ml24.12 ml/m +---------------+-------++-----------++ LA Vol (A4C):  47.6 ml22.83 ml/m +---------------+-------++-----------++ LA Biplane Vol:50.8 ml24.36 ml/m +---------------+-------++-----------++ +------------+---------++-----------++ RIGHT ATRIUM         Index        +------------+---------++-----------++ RA Pressure:3.00 mmHg            +------------+---------++-----------++ RA Area:    11.80 cm            +------------+---------++-----------++ RA Volume:  27.90 ml 13.38 ml/m +------------+---------++-----------++ +------------+-----------++ AORTIC VALVE            +------------+-----------++ LVOT Vmax:  82.45 cm/s  +------------+-----------++ LVOT Vmean: 60.850 cm/s +------------+-----------++ LVOT VTI:   0.190 m     +------------+-----------++  +-------------+-------++ AORTA                +-------------+-------++ Ao Root diam:3.20 cm +-------------+-------++ Ao Asc diam: 4.00 cm +-------------+-------++  +--------------+----------++  +---------------+---------++ MITRAL VALVE              TRICUSPID VALVE          +--------------+----------++  +---------------+---------++ MV Area (PHT):3.53 cm    Estimated RAP: 3.00 mmHg +--------------+----------++  +---------------+---------++ MV PHT:       62.35 msec +--------------+----------++  +--------------+-------+ MV Decel Time:215 msec    SHUNTS                +--------------+----------++  +--------------+-------+ +--------------+-----------++ Systemic VTI: 0.19 m  MV E velocity:88.80 cm/s  +--------------+-------+ +--------------+-----------++ Systemic Diam:1.90 cm MV A velocity:103.00 cm/s +--------------+-------+ +--------------+-----------++ MV E/A ratio: 0.86        +--------------+-----------++   Jodelle Red MD Electronically signed by Jodelle Red MD Signature Date/Time: 03/22/2019/6:10:02 PM    Final    MONITORS  LONG TERM MONITOR (3-14 DAYS) 04/15/2019  Narrative  Basic rhythm is NSR  Two non sustained SVT episodes, longet 11 beats not identified by patient  Triggered events did not correlated with rhythm disturbance.  Rare PVC's are noted.            Risk Assessment/Calculations:             Physical Exam:   VS:  BP 124/80 (BP Location: Right Arm, Patient Position: Sitting, Cuff Size: Normal)   Pulse 65   Ht 5\' 7"  (1.702 m)   Wt 198 lb 1.6 oz (89.9 kg)   LMP 04/09/2021 (Exact Date) Comment: sexually active,btl  BMI 31.03 kg/m    Wt Readings from Last 3 Encounters:  02/14/23 198 lb 1.6 oz (89.9 kg)  02/10/23 194 lb 12.8 oz (88.4 kg)  12/21/22 203 lb (92.1 kg)    GEN: Well nourished, well developed in no acute distress NECK: No JVD; No carotid bruits CARDIAC: RRR, no murmurs, rubs, gallops RESPIRATORY:  Clear  to auscultation without rales, wheezing or rhonchi  ABDOMEN: Soft, non-tender, non-distended EXTREMITIES:  No edema; No deformity   ASSESSMENT AND PLAN: .    Palpitations -reports occasional breakthrough palpitations.  Continue Toprol 25 mg daily.  May take additional 12.5 mg as needed for breakthrough palpitations.  Encouraged increased hydration.  Continue to limit caffeine, manage stress well.  If she is requiring as needed dosing routinely we will consider repeat monitor for which she will contact our office.    Prominent aorta / Ascending aorta dilation -Echo 03/2019 40 mm.  CT 03/2019 transverse diameter 3.8X 3.8 cm.  High normal for age.  Repeat CT 09/2021 with stable 3.8 cm dilation.  Not recommended for repeat imaging.  Continue optimal BP control.    HTN - Previous hypertension in setting of pregnancy.  Well-controlled today solely on Toprol 25 mg daily which she takes mostly for palpitations.Obesity - Congratulated on weight loss.  Great success with [employer].  Provided information on right side exercise program.  Hypothyroidism - Continue to follow with PCP.        Dispo: follow up 1 year  Signed, Alver Sorrow, NP

## 2023-02-14 NOTE — Patient Instructions (Addendum)
Medication Instructions:  Continue your current medications.   If you need an extra half tablet of Metoprolol for breakthrough palpitations.   *If you need a refill on your cardiac medications before your next appointment, please call your pharmacy*  Follow-Up: At [facility], you and your health needs are our priority.  As part of our continuing mission to provide you with exceptional heart care, we have created designated Provider Care Teams.  These Care Teams include your primary Cardiologist (physician) and Advanced Practice Providers (APPs -  Physician Assistants and Nurse Practitioners) who all work together to provide you with the care you need, when you need it.  We recommend signing up for the patient portal called "MyChart".  Sign up information is provided on this After Visit Summary.  MyChart is used to connect with patients for Virtual Visits (Telemedicine).  Patients are able to view lab/test results, encounter notes, upcoming appointments, etc.  Non-urgent messages can be sent to your provider as well.   To learn more about what you can do with MyChart, go to [email or web address].    Your next appointment:   1 year(s)  Provider:   Gillian Shields, NP    Other Instructions    To prevent palpitations: Make sure you are adequately hydrated.  Avoid and/or limit caffeine containing beverages like soda or tea. Exercise regularly.  Manage stress well. Some over the counter medications can cause palpitations such as Benadryl, AdvilPM, TylenolPM. Regular Advil or Tylenol do not cause palpitations.

## 2023-03-04 ENCOUNTER — Other Ambulatory Visit (HOSPITAL_BASED_OUTPATIENT_CLINIC_OR_DEPARTMENT_OTHER): Payer: Self-pay

## 2023-03-06 ENCOUNTER — Other Ambulatory Visit: Payer: Self-pay

## 2023-03-06 ENCOUNTER — Other Ambulatory Visit (HOSPITAL_BASED_OUTPATIENT_CLINIC_OR_DEPARTMENT_OTHER): Payer: Self-pay

## 2023-03-09 ENCOUNTER — Other Ambulatory Visit (HOSPITAL_BASED_OUTPATIENT_CLINIC_OR_DEPARTMENT_OTHER): Payer: Self-pay

## 2023-03-09 MED ORDER — DIAZEPAM 10 MG PO TABS
10.0000 mg | ORAL_TABLET | Freq: Once | ORAL | 0 refills | Status: AC
  Filled 2023-03-09 – 2023-03-22 (×2): qty 2, 1d supply, fill #0

## 2023-03-10 ENCOUNTER — Encounter: Payer: Self-pay | Admitting: Gastroenterology

## 2023-03-10 ENCOUNTER — Other Ambulatory Visit: Payer: 59

## 2023-03-10 DIAGNOSIS — A048 Other specified bacterial intestinal infections: Secondary | ICD-10-CM

## 2023-03-16 ENCOUNTER — Telehealth: Payer: Self-pay | Admitting: Nurse Practitioner

## 2023-03-16 ENCOUNTER — Encounter: Payer: Self-pay | Admitting: Plastic Surgery

## 2023-03-16 LAB — HELICOBACTER PYLORI  SPECIAL ANTIGEN
MICRO NUMBER:: 15420208
SPECIMEN QUALITY: ADEQUATE

## 2023-03-16 NOTE — Telephone Encounter (Signed)
[facility] called about synthroid rx Since she last had it filled manufacturer has changed from  Mylan to [facility] requires they get approval for manufacturer change  Please call   Pharmacist [phone]  Ref # [id number]

## 2023-03-20 NOTE — Telephone Encounter (Signed)
See msg

## 2023-03-21 ENCOUNTER — Other Ambulatory Visit (HOSPITAL_BASED_OUTPATIENT_CLINIC_OR_DEPARTMENT_OTHER): Payer: Self-pay | Admitting: Family

## 2023-03-21 ENCOUNTER — Other Ambulatory Visit (HOSPITAL_BASED_OUTPATIENT_CLINIC_OR_DEPARTMENT_OTHER): Payer: Self-pay

## 2023-03-21 DIAGNOSIS — R002 Palpitations: Secondary | ICD-10-CM

## 2023-03-22 ENCOUNTER — Other Ambulatory Visit (HOSPITAL_BASED_OUTPATIENT_CLINIC_OR_DEPARTMENT_OTHER): Payer: Self-pay

## 2023-03-22 NOTE — Telephone Encounter (Signed)
Rx request sent to pharmacy.  

## 2023-03-27 ENCOUNTER — Encounter: Payer: Self-pay | Admitting: Nurse Practitioner

## 2023-03-27 ENCOUNTER — Ambulatory Visit (INDEPENDENT_AMBULATORY_CARE_PROVIDER_SITE_OTHER): Payer: 59 | Admitting: Nurse Practitioner

## 2023-03-27 VITALS — BP 124/82 | HR 76 | Wt 196.2 lb

## 2023-03-27 DIAGNOSIS — N938 Other specified abnormal uterine and vaginal bleeding: Secondary | ICD-10-CM

## 2023-03-27 DIAGNOSIS — E038 Other specified hypothyroidism: Secondary | ICD-10-CM

## 2023-03-27 DIAGNOSIS — E782 Mixed hyperlipidemia: Secondary | ICD-10-CM | POA: Diagnosis not present

## 2023-03-27 DIAGNOSIS — R002 Palpitations: Secondary | ICD-10-CM | POA: Insufficient documentation

## 2023-03-27 DIAGNOSIS — R7303 Prediabetes: Secondary | ICD-10-CM

## 2023-03-27 DIAGNOSIS — Z6836 Body mass index (BMI) 36.0-36.9, adult: Secondary | ICD-10-CM

## 2023-03-27 DIAGNOSIS — E611 Iron deficiency: Secondary | ICD-10-CM

## 2023-03-27 DIAGNOSIS — Z23 Encounter for immunization: Secondary | ICD-10-CM | POA: Diagnosis not present

## 2023-03-27 DIAGNOSIS — R5383 Other fatigue: Secondary | ICD-10-CM

## 2023-03-27 NOTE — Assessment & Plan Note (Signed)
Repeat labs today for monitoring.

## 2023-03-27 NOTE — Assessment & Plan Note (Signed)
Etiology unknown. Consider thyroid and iron as possible causative factors. We will monitor both today.  Plan: - Let me know if these seem to get worse - Take an extra metoprolol as needed as recommended by Cardiology

## 2023-03-27 NOTE — Patient Instructions (Signed)
I am so proud of you!! You are doing fantastic!!    WEIGHT LOSS PLANNING Your progress today shows:     03/27/2023    8:48 AM 02/14/2023   10:40 AM 02/10/2023    8:38 AM  Vitals with BMI  Height  5\' 7"  5\' 7"   Weight 196 lbs 3 oz 198 lbs 2 oz 194 lbs 13 oz  BMI  31.02 30.5  Systolic 124 124 161  Diastolic 82 80 88  Pulse 76 65 72    For best management of weight, it is vital to balance intake versus output. This means the number of calories burned per day must be less than the calories you take in with food and drink.   I recommend trying to follow a diet with the following: Calories: 1200-1500 calories per day Carbohydrates: 150-180 grams of carbohydrates per day  Why: Gives your body enough "quick fuel" for cells to maintain normal function without sending them into starvation mode.  Protein: At least 90 grams of protein per day- 30 grams with each meal Why: Protein takes longer and uses more energy than carbohydrates to break down for fuel. The carbohydrates in your meals serves as quick energy sources and proteins help use some of that extra quick energy to break down to produce long term energy. This helps you not feel hungry as quickly and protein breakdown burns calories.  Water: Drink AT LEAST 64 ounces of water per day  Why: Water is essential to healthy metabolism. Water helps to fill the stomach and keep you fuller longer. Water is required for healthy digestion and filtering of waste in the body.  Fat: Limit fats in your diet- when choosing fats, choose foods with lower fats content such as lean meats (chicken, fish, [place]).  Why: Increased fat intake leads to storage "for later". Once you burn your carbohydrate energy, your body goes into fat and protein breakdown mode to help you loose weight.  Cholesterol: Fats and oils that are LIQUID at room temperature are best. Choose vegetable oils (olive oil, avocado oil, nuts). Avoid fats that are SOLID at room temperature (animal  fats, processed meats). Healthy fats are often found in whole grains, beans, nuts, seeds, and berries.  Why: Elevated cholesterol levels lead to build up of cholesterol on the inside of your blood vessels. This will eventually cause the blood vessels to become hard and can lead to high blood pressure and damage to your organs. When the blood flow is reduced, but the pressure is high from cholesterol buildup, parts of the cholesterol can break off and form clots that can go to the brain or heart leading to a stroke or heart attack.  Fiber: Increase amount of SOLUBLE the fiber in your diet. This helps to fill you up, lowers cholesterol, and helps with digestion. Some foods high in soluble fiber are oats, peas, beans, apples, carrots, barley, and citrus fruits.   Why: Fiber fills you up, helps remove excess cholesterol, and aids in healthy digestion which are all very important in weight management.   I recommend the following as a minimum activity routine: Purposeful walk or other physical activity at least 20 minutes every single day. This means purposefully taking a walk, jog, bike, swim, treadmill, elliptical, dance, etc.  This activity should be ABOVE your normal daily activities, such as walking at work. Goal exercise should be at least 150 minutes a week- work your way up to this.   Heart Rate: Your maximum exercise heart  rate should be 220 - Your Age in Years. When exercising, get your heart rate up, but avoid going over the maximum targeted heart rate.  60-70% of your maximum heart rate is where you tend to burn the most fat. To find this number:  220 - Age In Years= Max HR  Max HR x 0.6 (or 0.7) = Fat Burning HR The Fat Burning HR is your goal heart rate while working out to burn the most fat.  NEVER exercise to the point your feel lightheaded, weak, nauseated, dizzy. If you experience ANY of these symptoms- STOP exercise! Allow yourself to cool down and your heart rate to come down. Then  restart slower next time.  If at ANY TIME you feel chest pain or chest pressure during exercise, STOP IMMEDIATELY and seek medical attention.

## 2023-03-27 NOTE — Assessment & Plan Note (Addendum)
Chronic. No alarm symptoms present at this time. Currently managed with levothyroxine. She is having some palpitations, which may be related to thyroid. We will plan for labs today.  Plan: - Continue current medication and dosage. If we need to change the dose based on the labs, I will let you know.  - If you notice and new or worsening symptoms, please let me know.  - If you are feeling well, we will follow-up in 6 months.

## 2023-03-27 NOTE — Assessment & Plan Note (Signed)
Chronic. She is working on [employer] with Reginal Lutes and doing very well. We discussed ongoing management with diet and exercise.  Plan: - Continue wegovy at the current dose - Continue to incorporate more exercise into your routine. Zumba and pickleball are great options!

## 2023-03-27 NOTE — Progress Notes (Signed)
Erica Clamp, DNP, AGNP-c [facility]  [address] [place], [place] [address] [phone]  ESTABLISHED PATIENT- Chronic Health and/or Follow-Up Visit  Blood pressure 124/82, pulse 76, weight 196 lb 3.2 oz (89 kg), last menstrual period 04/09/2021.    Erica Hull is a 57 y.o. year old female presenting today for evaluation and management of chronic conditions.   Thyroid She is currently on 5 days a week and taking two days a week. She is not having an increased sensitivity to heat or cold. She has been having some palpitations intermittently, but has seen cardiology and they did not feel this was a concern on their end, but she can take an extra metoprolol if needed She has not had to take the extra dose.    Weight She is still taking the [facility] and has lost about 40lbs. She is exercising with Zumba classes and she is enjoying this. She has also been playing pickle ball. She has been working some with weights.   Back pain She is seeing ortho for this. She had an injection in march/April and this helped until June. She did have another shot at that time and it did not help. She just had a MRI to evaluate a little further. She has started PT. They did dry needling and it did not help.She tells me the pain is like an ache that is chronic.   Iron Se had an endoscopy and colonoscopy and this was normal with the exception of H Pylori. She tells me when she finished treatment she came off of the omeprazole for two weeks to retest and she had severe symptoms of GERD. She is awaiting the stool sample results for repeat testing to ensure this is gone. They are considering different testing with the pill form of the camera to ensure no bleeding throughout the GI tract.  Breast Reduction She has been seen by Dr. Ulice Bold and has had approval for breast reduction. She is hoping to have this done before the end of the year.   Menstrual Bleeding She also tells  me she had a period since she was last seen. It has been 2 years since her last period. She did see her GYN and they did testing and biopsy's and this was all normal. She has not had any additional bleeding.   All ROS negative with exception of what is listed above.   PHYSICAL EXAM Physical Exam Constitutional:      General: She is not in acute distress.    Appearance: Normal appearance. She is not diaphoretic.  HENT:     Head: Normocephalic.  Eyes:     Conjunctiva/sclera: Conjunctivae normal.     Pupils: Pupils are equal, round, and reactive to light.  Neck:     Vascular: No carotid bruit.  Cardiovascular:     Rate and Rhythm: Normal rate and regular rhythm.     Pulses: Normal pulses.     Heart sounds: Normal heart sounds.  Pulmonary:     Effort: Pulmonary effort is normal.     Breath sounds: Normal breath sounds.  Abdominal:     General: Bowel sounds are normal. There is no distension.     Palpations: Abdomen is soft.     Tenderness: There is no abdominal tenderness.  Musculoskeletal:     Cervical back: Normal range of motion and neck supple. No tenderness.     Right lower leg: No edema.     Left lower leg: No edema.  Skin:    General: Skin is warm and dry.     Capillary Refill: Capillary refill takes less than 2 seconds.  Neurological:     General: No focal deficit present.     Mental Status: She is alert and oriented to person, place, and time.  Psychiatric:        Behavior: Behavior normal.     PLAN Problem List Items Addressed This Visit     Hypothyroidism - Primary    Chronic. No alarm symptoms present at this time. Currently managed with levothyroxine. She is having some palpitations, which may be related to thyroid. We will plan for labs today.  Plan: - Continue current medication and dosage. If we need to change the dose based on the labs, I will let you know.  - If you notice and new or worsening symptoms, please let me know.  - If you are feeling well, we  will follow-up in 6 months.       Relevant Orders   TSH   DUB (dysfunctional uterine bleeding)    Recent menstrual cycle after 2 years post-menopause. Work-up with OB GYN was negative.  Plan: - Let me or GYN know immediately if you have any new bleeding.      BMI 36.0-36.9,adult    Chronic. She is working on [employer] with Reginal Lutes and doing very well. We discussed ongoing management with diet and exercise.  Plan: - Continue wegovy at the current dose - Continue to incorporate more exercise into your routine. Zumba and pickleball are great options!      Relevant Orders   Hemoglobin A1c   CBC with Differential/Platelet   Comprehensive metabolic panel   Iron, TIBC and Ferritin Panel   TSH   Moderate mixed hyperlipidemia not requiring statin therapy    Repeat labs today for monitoring.       Relevant Orders   Hemoglobin A1c   CBC with Differential/Platelet   Comprehensive metabolic panel   Iron, TIBC and Ferritin Panel   TSH   Iron deficiency    No cause identified with GI. The symptoms started prior to the menstrual period, therefore this does not appear to correlate. She has had a few palpitations, but unclear if these are related. We will monitor her labs today.  Plan: - Continue every other day dosing of iron supplement.  - Let me know if you have any new or worsening symptoms.       Relevant Orders   CBC with Differential/Platelet   Iron, TIBC and Ferritin Panel   Intermittent palpitations    Etiology unknown. Consider thyroid and iron as possible causative factors. We will monitor both today.  Plan: - Let me know if these seem to get worse - Take an extra metoprolol as needed as recommended by Cardiology       Relevant Orders   Hemoglobin A1c   CBC with Differential/Platelet   Comprehensive metabolic panel   Iron, TIBC and Ferritin Panel   TSH   Fatigue   Other Visit Diagnoses     Need for influenza vaccination       Relevant Orders   Flu  vaccine trivalent PF, 6mos and older(Flulaval,Afluria,Fluarix,Fluzone) (Completed)   Pre-diabetes       Relevant Orders   Hemoglobin A1c   Comprehensive metabolic panel       Return in about 6 months (around 09/24/2023) for CPE.  Time: 42 minutes, >50% spent counseling, care coordination, chart review, and documentation.   SaraBeth Hobie Kohles,  DNP, AGNP-c

## 2023-03-27 NOTE — Assessment & Plan Note (Signed)
No cause identified with GI. The symptoms started prior to the menstrual period, therefore this does not appear to correlate. She has had a few palpitations, but unclear if these are related. We will monitor her labs today.  Plan: - Continue every other day dosing of iron supplement.  - Let me know if you have any new or worsening symptoms.

## 2023-03-27 NOTE — Assessment & Plan Note (Signed)
Recent menstrual cycle after 2 years post-menopause. Work-up with OB GYN was negative.  Plan: - Let me or GYN know immediately if you have any new bleeding.

## 2023-03-28 LAB — IRON,TIBC AND FERRITIN PANEL
Ferritin: 30 ng/mL (ref 15–150)
Iron Saturation: 42 % (ref 15–55)
Iron: 146 ug/dL (ref 27–159)
Total Iron Binding Capacity: 347 ug/dL (ref 250–450)
UIBC: 201 ug/dL (ref 131–425)

## 2023-03-28 LAB — CBC WITH DIFFERENTIAL/PLATELET
Basophils Absolute: 0.1 10*3/uL (ref 0.0–0.2)
Basos: 2 %
EOS (ABSOLUTE): 0.1 10*3/uL (ref 0.0–0.4)
Eos: 3 %
Hematocrit: 42 % (ref 34.0–46.6)
Hemoglobin: 13.6 g/dL (ref 11.1–15.9)
Immature Grans (Abs): 0 10*3/uL (ref 0.0–0.1)
Immature Granulocytes: 0 %
Lymphocytes Absolute: 1.7 10*3/uL (ref 0.7–3.1)
Lymphs: 35 %
MCH: 27.4 pg (ref 26.6–33.0)
MCHC: 32.4 g/dL (ref 31.5–35.7)
MCV: 85 fL (ref 79–97)
Monocytes Absolute: 0.5 10*3/uL (ref 0.1–0.9)
Monocytes: 11 %
Neutrophils Absolute: 2.4 10*3/uL (ref 1.4–7.0)
Neutrophils: 49 %
Platelets: 203 10*3/uL (ref 150–450)
RBC: 4.97 x10E6/uL (ref 3.77–5.28)
RDW: 14.6 % (ref 11.7–15.4)
WBC: 4.9 10*3/uL (ref 3.4–10.8)

## 2023-03-28 LAB — TSH: TSH: 1.04 u[IU]/mL (ref 0.450–4.500)

## 2023-03-28 LAB — COMPREHENSIVE METABOLIC PANEL
ALT: 23 IU/L (ref 0–32)
AST: 26 IU/L (ref 0–40)
Albumin: 4.3 g/dL (ref 3.8–4.9)
Alkaline Phosphatase: 75 IU/L (ref 44–121)
BUN/Creatinine Ratio: 20 (ref 9–23)
BUN: 15 mg/dL (ref 6–24)
Bilirubin Total: 0.7 mg/dL (ref 0.0–1.2)
CO2: 26 mmol/L (ref 20–29)
Calcium: 10.8 mg/dL — ABNORMAL HIGH (ref 8.7–10.2)
Chloride: 103 mmol/L (ref 96–106)
Creatinine, Ser: 0.75 mg/dL (ref 0.57–1.00)
Globulin, Total: 2.3 g/dL (ref 1.5–4.5)
Glucose: 82 mg/dL (ref 70–99)
Potassium: 4.5 mmol/L (ref 3.5–5.2)
Sodium: 140 mmol/L (ref 134–144)
Total Protein: 6.6 g/dL (ref 6.0–8.5)
eGFR: 93 mL/min/{1.73_m2} (ref >59)

## 2023-03-28 LAB — HEMOGLOBIN A1C
Est. average glucose Bld gHb Est-mCnc: 108 mg/dL
Hgb A1c MFr Bld: 5.4 % (ref 4.8–5.6)

## 2023-03-29 NOTE — Telephone Encounter (Signed)
Dr Meridee Score have you seen any H pylori test results for this pt?  I see collected on 8/30 but nothing since.

## 2023-03-29 NOTE — Telephone Encounter (Signed)
Looks like it is still pending.  Hopefully we will get a result by the end of the week, can you follow-up with the lab on Friday if it has not returned by then?

## 2023-03-31 ENCOUNTER — Telehealth: Payer: Self-pay

## 2023-03-31 NOTE — Telephone Encounter (Signed)
Results placed in Dr Meridee Score inbox

## 2023-03-31 NOTE — Telephone Encounter (Signed)
Negative. Please let patient know. GM

## 2023-03-31 NOTE — Telephone Encounter (Signed)
The pt has been advised via My Chart- she does view her messages

## 2023-03-31 NOTE — Telephone Encounter (Signed)
Ro the lab is faxing the H pylori results in a few minutes. Can you please put that on Dr Elesa Hacker desk.  Thank you.  FYI Dr Meridee Score

## 2023-03-31 NOTE — Telephone Encounter (Signed)
-----   Message from Nurse Yorel Redder P sent at 03/30/2023  1:08 PM EDT -----  Looks like it is still pending.  Hopefully we will get a result by the end of the week, can you follow-up with the lab on Friday if it has not returned by then?   Note  You routed conversation to Mansouraty, Netty Starring., MDYesterday (9:09 AM)  Erica Quin  Marzella Schlein RossonYesterday (9:09 AM)   There are still no results available. I will call the lab and see what the is causing the delay.    YouYesterday (9:09 AM)   Dr Meridee Score have you seen any H pylori test results for this pt?  I see collected on 8/30 but nothing since.

## 2023-03-31 NOTE — Telephone Encounter (Signed)
Call placed to the lab to Inquire about h pylori results.  Clydie Braun will investigate and get back to me.

## 2023-04-04 ENCOUNTER — Other Ambulatory Visit (HOSPITAL_BASED_OUTPATIENT_CLINIC_OR_DEPARTMENT_OTHER): Payer: Self-pay

## 2023-04-04 ENCOUNTER — Other Ambulatory Visit: Payer: Self-pay | Admitting: Nurse Practitioner

## 2023-04-04 DIAGNOSIS — E038 Other specified hypothyroidism: Secondary | ICD-10-CM

## 2023-04-04 MED ORDER — CELECOXIB 200 MG PO CAPS
200.0000 mg | ORAL_CAPSULE | Freq: Every day | ORAL | 1 refills | Status: DC
  Filled 2023-04-04: qty 30, 30d supply, fill #0
  Filled 2023-04-30: qty 30, 30d supply, fill #1

## 2023-04-04 MED ORDER — LEVOTHYROXINE SODIUM 88 MCG PO TABS
ORAL_TABLET | ORAL | 4 refills | Status: DC
Start: 2023-04-04 — End: 2023-08-29
  Filled 2023-04-04: qty 34, 30d supply, fill #0
  Filled 2023-04-17: qty 96, 90d supply, fill #0
  Filled 2023-04-17: qty 100, 90d supply, fill #0

## 2023-04-05 ENCOUNTER — Other Ambulatory Visit (HOSPITAL_BASED_OUTPATIENT_CLINIC_OR_DEPARTMENT_OTHER): Payer: Self-pay

## 2023-04-11 LAB — HM MAMMOGRAPHY

## 2023-04-12 ENCOUNTER — Encounter: Payer: Self-pay | Admitting: Nurse Practitioner

## 2023-04-17 ENCOUNTER — Other Ambulatory Visit (HOSPITAL_BASED_OUTPATIENT_CLINIC_OR_DEPARTMENT_OTHER): Payer: Self-pay

## 2023-04-17 ENCOUNTER — Encounter: Payer: Self-pay | Admitting: Nurse Practitioner

## 2023-04-18 ENCOUNTER — Other Ambulatory Visit: Payer: Self-pay

## 2023-04-18 ENCOUNTER — Encounter: Payer: Self-pay | Admitting: Student

## 2023-04-18 ENCOUNTER — Telehealth: Payer: Self-pay

## 2023-04-18 ENCOUNTER — Telehealth: Payer: Self-pay | Admitting: *Deleted

## 2023-04-18 ENCOUNTER — Other Ambulatory Visit (HOSPITAL_BASED_OUTPATIENT_CLINIC_OR_DEPARTMENT_OTHER): Payer: Self-pay

## 2023-04-18 ENCOUNTER — Ambulatory Visit (INDEPENDENT_AMBULATORY_CARE_PROVIDER_SITE_OTHER): Payer: 59 | Admitting: Student

## 2023-04-18 VITALS — BP 131/79 | HR 75 | Ht 67.0 in | Wt 201.6 lb

## 2023-04-18 DIAGNOSIS — N62 Hypertrophy of breast: Secondary | ICD-10-CM

## 2023-04-18 MED ORDER — CEPHALEXIN 500 MG PO CAPS
500.0000 mg | ORAL_CAPSULE | Freq: Four times a day (QID) | ORAL | 0 refills | Status: AC
Start: 2023-04-18 — End: 2023-04-23
  Filled 2023-04-18: qty 12, 3d supply, fill #0

## 2023-04-18 MED ORDER — OXYCODONE HCL 5 MG PO TABS
5.0000 mg | ORAL_TABLET | Freq: Four times a day (QID) | ORAL | 0 refills | Status: DC | PRN
Start: 2023-04-18 — End: 2023-11-21
  Filled 2023-04-18: qty 20, 5d supply, fill #0

## 2023-04-18 NOTE — Telephone Encounter (Signed)
Faxed record release form to [facility] requesting patient's recent Mammogram results.  Confirmation received and copy scanned into the chart.//AB/CMA

## 2023-04-18 NOTE — Telephone Encounter (Signed)
Faxed Request for Surgical Clearance form to Gillian Shields, NP: [phone], with confirmed receipt.

## 2023-04-18 NOTE — Progress Notes (Addendum)
Patient ID: Erica Hull, female    DOB: June 16, 1966, 57 y.o.   MRN: [id number]  Chief Complaint  Patient presents with   Pre-op Exam      ICD-10-CM   1. Symptomatic mammary hypertrophy  N62        History of Present Illness: Erica Hull is a 57 y.o.  female  with a history of macromastia.  She presents for preoperative evaluation for upcoming procedure, Bilateral Breast Reduction with liposuction, scheduled for 05/10/2023 with Dr.  Ulice Bold  The patient has not had problems with anesthesia.  Patient reports she had her mammogram done last week, and states that it was negative.  She denies any personal family history of breast cancer.  Patient does report she has PVCs and a small aortic aneurysm.  She states that she does see a cardiologist provider in regards to this.  She states that her cardiology provider is aware of her upcoming surgery and states that she should not have any issues proceeding.  Discussed with patient that we will still send clearance to her cardiology provider.  Patient denies taking any blood thinners.  She denies smoking.  Patient denies taking any birth control or hormone replacement.  She reports history of 1 miscarriage.  She denies any personal family history of blood clots or clotting diseases.  She denies any recent surgeries, traumas, infections.  She denies any history of stroke or heart attack.  She denies any history of Crohn's disease or ulcerative colitis.  She denies any history of COPD or asthma.  She denies any history of cancer.  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  She denies any recent fevers or chills.  Patient reports she is currently a 36H cup.  Patient states that she would like to be a DD/DDD cup.  Discussed with patient that cup size cannot be guaranteed.  Patient expressed understanding.  Summary of Previous Visit: Patient was seen for initial consult by Dr. Ulice Bold on 10/11/2022.  At this visit,  patient reported back and neck pain due to her enlarged breasts.  On exam, her STN on the right was 33 cm and resting and on the left was 34 cm.  Her BMI was 32.6 kg/m.  Her preoperative bra size was in H cup.  Patient reported she wanted to be a C/D cup.  Patient was encouraged to lose weight and return to our clinic to see if she was closer to her ideal weight.  Patient was then seen in the clinic again on 02/10/2023.  At this visit, patient reported she was down about 10 pounds from her previous visit.  The estimated amount of tissue to be removed at the time of surgery was 650 g per breast.  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  Patient did have hemoglobin checked 3 weeks ago, her hemoglobin was 13.6 at that time.  Past Medical History: Allergies: No Known Allergies  Current Medications:  Current Outpatient Medications:    B Complex-C-Folic Acid (SUPER B COMPLEX/FA/VIT C PO), Take 1 tablet by mouth daily., Disp: , Rfl:    celecoxib (CELEBREX) 200 MG capsule, Take 1 capsule (200 mg total) by mouth daily., Disp: 30 capsule, Rfl: 1  Cholecalciferol (D3-1000) 25 MCG (1000 UT) tablet, , Disp: , Rfl:    Cyanocobalamin (VITAMIN B 12 PO), Take 1,000 mcg by mouth daily., Disp: , Rfl:    Ginkgo Biloba 120 MG CAPS, Take 1 tablet by mouth daily., Disp: , Rfl:    levothyroxine (SYNTHROID) 88 MCG tablet, Take 1 tab ( ) by mouth 5 days a week and 1.5 tabs ( ) by mouth 2 days a week., Disp: 100 tablet, Rfl: 4   metoprolol succinate (TOPROL-XL) 25 MG 24 hr tablet, TAKE 1 TABLET BY MOUTH ONCE  DAILY, Disp: 90 tablet, Rfl: 3   omeprazole (PRILOSEC) 20 MG capsule, Take 20 mg by mouth daily., Disp: , Rfl:    ondansetron (ZOFRAN) 8 MG  tablet, Take 1 tablet (8 mg total) by mouth every 8 (eight) hours as needed for nausea or vomiting. (Patient not taking: Reported on 04/19/2023), Disp: 20 tablet, Rfl: 3   oxyCODONE (ROXICODONE) 5 MG immediate release tablet, Take 1 tablet (5 mg total) by mouth every 6 (six) hours as needed for up to 20 doses for severe pain. (Patient not taking: Reported on 04/19/2023), Disp: 20 tablet, Rfl: 0   Rhubarb (ESTROVEN COMPLETE PO), Take 1 capsule by mouth daily., Disp: , Rfl:    Semaglutide-Weight Management (WEGOVY) 2.4 MG/0.75ML SOAJ, Inject 2.4 mg into the skin once a week., Disp: 9 mL, Rfl: 3   TURMERIC CURCUMIN PO, Take 2,250 mg by mouth daily., Disp: , Rfl:    vitamin E 400 UNIT capsule, Take 400 Units by mouth daily., Disp: , Rfl:    Iron, Ferrous Sulfate, 325 (65 Fe) MG TABS, Take 1 tablet by mouth every other day., Disp: 45 tablet, Rfl: 3   Omega-3 Fatty Acids (FISH OIL) 1200 MG CAPS, Take 1 capsule by mouth daily. (Patient not taking: Reported on 03/27/2023), Disp: , Rfl:   Past Medical Problems: Past Medical History:  Diagnosis Date   Anemia    Elective abortion    X2   Enlarged aorta (HCC)    H/O peripheral vascular disease 2006   Heart murmur    Hx of cystitis 2006   Hypertension    pregnancy induced   Hypothyroidism    Missed ab     Past Surgical History: Past Surgical History:  Procedure Laterality Date   CESAREAN SECTION     CESAREAN SECTION W/BTL     COLONOSCOPY     DILATION AND CURETTAGE OF UTERUS     ENDOMETRIAL ABLATION     HTA   ENDOSCOPIC VEIN LASER TREATMENT     UPPER GASTROINTESTINAL ENDOSCOPY     vein closure procedure Left 07/11/2004    Social History: Social History   Socioeconomic History   Marital status: Married    Spouse name: Not on file   Number of children: 2   Years of education: Not on file   Highest education level: Not on file  Occupational History   Not on file  Tobacco Use   Smoking status: Never   Smokeless tobacco: Never  Vaping  Use   Vaping status: Never Used  Substance and Sexual Activity   Alcohol use: Not Currently   Drug use: No   Sexual activity: Yes    Partners: Male    Birth control/protection: Surgical    Comment: 1ST Intercourse- 20, partners- 3, btl  Other Topics Concern   Not on file  Social History Narrative   Not on file   Social Determinants of Health   Financial Resource Strain: Not on  file  Food Insecurity: Not on file  Transportation Needs: Not on file  Physical Activity: Not on file  Stress: Not on file  Social Connections: Not on file  Intimate Partner Violence: Not on file    Family History: Family History  Problem Relation Age of Onset   Rheum arthritis Mother    COPD Mother    Dementia Mother    Hypertension Father    Cancer Father 26       ureter   Atrial fibrillation Father    Colon polyps Father    Colon cancer Neg Hx    Stomach cancer Neg Hx    Esophageal cancer Neg Hx    Rectal cancer Neg Hx     Review of Systems: Denies any recent fevers or chills  Physical Exam: Vital Signs BP 131/79 (BP Location: Left Arm, Patient Position: Sitting, Cuff Size: Normal)   Pulse 75   Ht 5\' 7"  (1.702 m)   Wt 201 lb 9.6 oz (91.4 kg)   LMP 04/09/2021 (Exact Date) Comment: sexually active,btl  SpO2 96%   BMI 31.58 kg/m   Physical Exam  Constitutional:      General: Not in acute distress.    Appearance: Normal appearance. Not ill-appearing.  HENT:     Head: Normocephalic and atraumatic.  Neck:     Musculoskeletal: Normal range of motion.  Cardiovascular:     Rate and Rhythm: Normal rate Pulmonary:     Effort: Pulmonary effort is normal. No respiratory distress.  Musculoskeletal: Normal range of motion.  Skin:    General: Skin is warm and dry.     Findings: No erythema or rash.  Neurological:     Mental Status: Alert and oriented to person, place, and time. Mental status is at baseline.  Psychiatric:        Mood and Affect: Mood normal.        Behavior:  Behavior normal.    Assessment/Plan: The patient is scheduled for bilateral breast reduction with Dr. Ulice Bold.  Risks, benefits, and alternatives of procedure discussed, questions answered and consent obtained.    Smoking Status: Non-smoker; Counseling Given?  N/A Last Mammogram: 04/11/2023; Results: Self-reports negative  Will send clearance to patient's cardiology provider.  Caprini Score: 5; Risk Factors include: Age, BMI greater than 25, varicosities to lower extremities and length of planned surgery. Recommendation for mechanical prophylaxis. Encourage early ambulation.   Pictures obtained: @consult   Post-op Rx sent to pharmacy:  Keflex, oxycodone-patient states that she has Zofran at home  Instructed patient to hold multivitamins, supplements and Celebrex at least 1 week prior to surgery.  Discussed with patient to hold her Wegovy the dose before surgery.  Patient expressed understanding.  Patient was provided with the breast reduction and General Surgical Risk consent document and Pain Medication Agreement prior to their appointment.  They had adequate time to read through the risk consent documents and Pain Medication Agreement. We also discussed them in person together during this preop appointment. All of their questions were answered to their satisfaction.  Recommended calling if they have any further questions.  Risk consent form and Pain Medication Agreement to be scanned into patient's chart.  The risk that can be encountered with breast reduction were discussed and include the following but not limited to these:  Breast asymmetry, fluid accumulation, firmness of the breast, inability to breast feed, loss of nipple or areola, skin loss, decrease or no nipple sensation, fat necrosis of the breast tissue, bleeding, infection, healing delay.  There are risks of anesthesia, changes to skin sensation and injury to nerves or blood vessels.  The muscle can be temporarily or permanently  injured.  You may have an allergic reaction to tape, suture, glue, blood products which can result in skin discoloration, swelling, pain, skin lesions, poor healing.  Any of these can lead to the need for revisonal surgery or stage procedures.  A reduction has potential to interfere with diagnostic procedures.  Nipple or breast piercing can increase risks of infection.  This procedure is best done when the breast is fully developed.  Changes in the breast will continue to occur over time.  Pregnancy can alter the outcomes of previous breast reduction surgery, weight gain and weigh loss can also effect the long term appearance.     Electronically signed by: Laurena Spies, PA-C 04/25/2023 7:44 AM

## 2023-04-18 NOTE — H&P (View-Only) (Signed)
Patient ID: Erica Hull, female    DOB: June 16, 1966, 57 y.o.   MRN: [id number]  Chief Complaint  Patient presents with   Pre-op Exam      ICD-10-CM   1. Symptomatic mammary hypertrophy  N62        History of Present Illness: Erica Hull is a 57 y.o.  female  with a history of macromastia.  She presents for preoperative evaluation for upcoming procedure, Bilateral Breast Reduction with liposuction, scheduled for 05/10/2023 with Dr.  Ulice Bold  The patient has not had problems with anesthesia.  Patient reports she had her mammogram done last week, and states that it was negative.  She denies any personal family history of breast cancer.  Patient does report she has PVCs and a small aortic aneurysm.  She states that she does see a cardiologist provider in regards to this.  She states that her cardiology provider is aware of her upcoming surgery and states that she should not have any issues proceeding.  Discussed with patient that we will still send clearance to her cardiology provider.  Patient denies taking any blood thinners.  She denies smoking.  Patient denies taking any birth control or hormone replacement.  She reports history of 1 miscarriage.  She denies any personal family history of blood clots or clotting diseases.  She denies any recent surgeries, traumas, infections.  She denies any history of stroke or heart attack.  She denies any history of Crohn's disease or ulcerative colitis.  She denies any history of COPD or asthma.  She denies any history of cancer.  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  She denies any recent fevers or chills.  Patient reports she is currently a 36H cup.  Patient states that she would like to be a DD/DDD cup.  Discussed with patient that cup size cannot be guaranteed.  Patient expressed understanding.  Summary of Previous Visit: Patient was seen for initial consult by Dr. Ulice Bold on 10/11/2022.  At this visit,  patient reported back and neck pain due to her enlarged breasts.  On exam, her STN on the right was 33 cm and resting and on the left was 34 cm.  Her BMI was 32.6 kg/m.  Her preoperative bra size was in H cup.  Patient reported she wanted to be a C/D cup.  Patient was encouraged to lose weight and return to our clinic to see if she was closer to her ideal weight.  Patient was then seen in the clinic again on 02/10/2023.  At this visit, patient reported she was down about 10 pounds from her previous visit.  The estimated amount of tissue to be removed at the time of surgery was 650 g per breast.  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  Patient did have hemoglobin checked 3 weeks ago, her hemoglobin was 13.6 at that time.  Past Medical History: Allergies: No Known Allergies  Current Medications:  Current Outpatient Medications:    B Complex-C-Folic Acid (SUPER B COMPLEX/FA/VIT C PO), Take 1 tablet by mouth daily., Disp: , Rfl:    celecoxib (CELEBREX) 200 MG capsule, Take 1 capsule (200 mg total) by mouth daily., Disp: 30 capsule, Rfl: 1  Cholecalciferol (D3-1000) 25 MCG (1000 UT) tablet, , Disp: , Rfl:    Cyanocobalamin (VITAMIN B 12 PO), Take 1,000 mcg by mouth daily., Disp: , Rfl:    Ginkgo Biloba 120 MG CAPS, Take 1 tablet by mouth daily., Disp: , Rfl:    levothyroxine (SYNTHROID) 88 MCG tablet, Take 1 tab ( ) by mouth 5 days a week and 1.5 tabs ( ) by mouth 2 days a week., Disp: 100 tablet, Rfl: 4   metoprolol succinate (TOPROL-XL) 25 MG 24 hr tablet, TAKE 1 TABLET BY MOUTH ONCE  DAILY, Disp: 90 tablet, Rfl: 3   omeprazole (PRILOSEC) 20 MG capsule, Take 20 mg by mouth daily., Disp: , Rfl:    ondansetron (ZOFRAN) 8 MG  tablet, Take 1 tablet (8 mg total) by mouth every 8 (eight) hours as needed for nausea or vomiting. (Patient not taking: Reported on 04/19/2023), Disp: 20 tablet, Rfl: 3   oxyCODONE (ROXICODONE) 5 MG immediate release tablet, Take 1 tablet (5 mg total) by mouth every 6 (six) hours as needed for up to 20 doses for severe pain. (Patient not taking: Reported on 04/19/2023), Disp: 20 tablet, Rfl: 0   Rhubarb (ESTROVEN COMPLETE PO), Take 1 capsule by mouth daily., Disp: , Rfl:    Semaglutide-Weight Management (WEGOVY) 2.4 MG/0.75ML SOAJ, Inject 2.4 mg into the skin once a week., Disp: 9 mL, Rfl: 3   TURMERIC CURCUMIN PO, Take 2,250 mg by mouth daily., Disp: , Rfl:    vitamin E 400 UNIT capsule, Take 400 Units by mouth daily., Disp: , Rfl:    Iron, Ferrous Sulfate, 325 (65 Fe) MG TABS, Take 1 tablet by mouth every other day., Disp: 45 tablet, Rfl: 3   Omega-3 Fatty Acids (FISH OIL) 1200 MG CAPS, Take 1 capsule by mouth daily. (Patient not taking: Reported on 03/27/2023), Disp: , Rfl:   Past Medical Problems: Past Medical History:  Diagnosis Date   Anemia    Elective abortion    X2   Enlarged aorta (HCC)    H/O peripheral vascular disease 2006   Heart murmur    Hx of cystitis 2006   Hypertension    pregnancy induced   Hypothyroidism    Missed ab     Past Surgical History: Past Surgical History:  Procedure Laterality Date   CESAREAN SECTION     CESAREAN SECTION W/BTL     COLONOSCOPY     DILATION AND CURETTAGE OF UTERUS     ENDOMETRIAL ABLATION     HTA   ENDOSCOPIC VEIN LASER TREATMENT     UPPER GASTROINTESTINAL ENDOSCOPY     vein closure procedure Left 07/11/2004    Social History: Social History   Socioeconomic History   Marital status: Married    Spouse name: Not on file   Number of children: 2   Years of education: Not on file   Highest education level: Not on file  Occupational History   Not on file  Tobacco Use   Smoking status: Never   Smokeless tobacco: Never  Vaping  Use   Vaping status: Never Used  Substance and Sexual Activity   Alcohol use: Not Currently   Drug use: No   Sexual activity: Yes    Partners: Male    Birth control/protection: Surgical    Comment: 1ST Intercourse- 20, partners- 3, btl  Other Topics Concern   Not on file  Social History Narrative   Not on file   Social Determinants of Health   Financial Resource Strain: Not on  file  Food Insecurity: Not on file  Transportation Needs: Not on file  Physical Activity: Not on file  Stress: Not on file  Social Connections: Not on file  Intimate Partner Violence: Not on file    Family History: Family History  Problem Relation Age of Onset   Rheum arthritis Mother    COPD Mother    Dementia Mother    Hypertension Father    Cancer Father 26       ureter   Atrial fibrillation Father    Colon polyps Father    Colon cancer Neg Hx    Stomach cancer Neg Hx    Esophageal cancer Neg Hx    Rectal cancer Neg Hx     Review of Systems: Denies any recent fevers or chills  Physical Exam: Vital Signs BP 131/79 (BP Location: Left Arm, Patient Position: Sitting, Cuff Size: Normal)   Pulse 75   Ht 5\' 7"  (1.702 m)   Wt 201 lb 9.6 oz (91.4 kg)   LMP 04/09/2021 (Exact Date) Comment: sexually active,btl  SpO2 96%   BMI 31.58 kg/m   Physical Exam  Constitutional:      General: Not in acute distress.    Appearance: Normal appearance. Not ill-appearing.  HENT:     Head: Normocephalic and atraumatic.  Neck:     Musculoskeletal: Normal range of motion.  Cardiovascular:     Rate and Rhythm: Normal rate Pulmonary:     Effort: Pulmonary effort is normal. No respiratory distress.  Musculoskeletal: Normal range of motion.  Skin:    General: Skin is warm and dry.     Findings: No erythema or rash.  Neurological:     Mental Status: Alert and oriented to person, place, and time. Mental status is at baseline.  Psychiatric:        Mood and Affect: Mood normal.        Behavior:  Behavior normal.    Assessment/Plan: The patient is scheduled for bilateral breast reduction with Dr. Ulice Bold.  Risks, benefits, and alternatives of procedure discussed, questions answered and consent obtained.    Smoking Status: Non-smoker; Counseling Given?  N/A Last Mammogram: 04/11/2023; Results: Self-reports negative  Will send clearance to patient's cardiology provider.  Caprini Score: 5; Risk Factors include: Age, BMI greater than 25, varicosities to lower extremities and length of planned surgery. Recommendation for mechanical prophylaxis. Encourage early ambulation.   Pictures obtained: @consult   Post-op Rx sent to pharmacy:  Keflex, oxycodone-patient states that she has Zofran at home  Instructed patient to hold multivitamins, supplements and Celebrex at least 1 week prior to surgery.  Discussed with patient to hold her Wegovy the dose before surgery.  Patient expressed understanding.  Patient was provided with the breast reduction and General Surgical Risk consent document and Pain Medication Agreement prior to their appointment.  They had adequate time to read through the risk consent documents and Pain Medication Agreement. We also discussed them in person together during this preop appointment. All of their questions were answered to their satisfaction.  Recommended calling if they have any further questions.  Risk consent form and Pain Medication Agreement to be scanned into patient's chart.  The risk that can be encountered with breast reduction were discussed and include the following but not limited to these:  Breast asymmetry, fluid accumulation, firmness of the breast, inability to breast feed, loss of nipple or areola, skin loss, decrease or no nipple sensation, fat necrosis of the breast tissue, bleeding, infection, healing delay.  There are risks of anesthesia, changes to skin sensation and injury to nerves or blood vessels.  The muscle can be temporarily or permanently  injured.  You may have an allergic reaction to tape, suture, glue, blood products which can result in skin discoloration, swelling, pain, skin lesions, poor healing.  Any of these can lead to the need for revisonal surgery or stage procedures.  A reduction has potential to interfere with diagnostic procedures.  Nipple or breast piercing can increase risks of infection.  This procedure is best done when the breast is fully developed.  Changes in the breast will continue to occur over time.  Pregnancy can alter the outcomes of previous breast reduction surgery, weight gain and weigh loss can also effect the long term appearance.     Electronically signed by: Laurena Spies, PA-C 04/25/2023 7:44 AM

## 2023-04-19 ENCOUNTER — Telehealth: Payer: Self-pay | Admitting: *Deleted

## 2023-04-19 ENCOUNTER — Telehealth (HOSPITAL_BASED_OUTPATIENT_CLINIC_OR_DEPARTMENT_OTHER): Payer: Self-pay | Admitting: *Deleted

## 2023-04-19 NOTE — Telephone Encounter (Signed)
   Pre-operative Risk Assessment    Patient Name: Erica Hull  DOB: Nov 23, 1965 MRN: [id number]      Request for Surgical Clearance    Procedure:   Breast Reduction  Date of Surgery:  Clearance 05/11/23                                 Surgeon:  Dr. Foster Simpson Surgeon's Group or Practice Name:  [facility] Phone number:  [phone] Fax number:  [phone]   Type of Clearance Requested:   - Medical    Type of Anesthesia:  Not Indicated   Additional requests/questions:    Signed, Emmit Pomfret   04/19/2023, 7:34 AM

## 2023-04-19 NOTE — Telephone Encounter (Signed)
I s/w the pt and she has been scheduled tele pre op appt 05/02/23. Med rec and consent are done.

## 2023-04-19 NOTE — Telephone Encounter (Signed)
   Name: Erica Hull  DOB: [birth year]  MRN: [id number]  Primary Cardiologist: None   Preoperative team, please contact this patient and set up a phone call appointment for further preoperative risk assessment. Please obtain consent and complete medication review. Thank you for your help.  I confirm that guidance regarding antiplatelet and oral anticoagulation therapy has been completed and, if necessary, noted below (none requested).   I also confirmed the patient resides in the state of [place]. As per [facility] Medical Board telemedicine laws, the patient must reside in the state in which the provider is licensed.   Joylene Grapes, NP 04/19/2023, 12:32 PM [facility]

## 2023-04-19 NOTE — Telephone Encounter (Signed)
I s/w the pt and she has been scheduled tele pre op appt 05/02/23. Med rec and consent are done.     Patient Consent for Virtual Visit        Erica Hull has provided verbal consent on 04/19/2023 for a virtual visit (video or telephone).   CONSENT FOR VIRTUAL VISIT FOR:  Erica Hull  By participating in this virtual visit I agree to the following:  I hereby voluntarily request, consent and authorize [facility] and its employed or contracted physicians, physician assistants, nurse practitioners or other licensed health care professionals (the Practitioner), to provide me with telemedicine health care services (the "Services") as deemed necessary by the treating Practitioner. I acknowledge and consent to receive the Services by the Practitioner via telemedicine. I understand that the telemedicine visit will involve communicating with the Practitioner through live audiovisual communication technology and the disclosure of certain medical information by electronic transmission. I acknowledge that I have been given the opportunity to request an in-person assessment or other available alternative prior to the telemedicine visit and am voluntarily participating in the telemedicine visit.  I understand that I have the right to withhold or withdraw my consent to the use of telemedicine in the course of my care at any time, without affecting my right to future care or treatment, and that the Practitioner or I may terminate the telemedicine visit at any time. I understand that I have the right to inspect all information obtained and/or recorded in the course of the telemedicine visit and may receive copies of available information for a reasonable fee.  I understand that some of the potential risks of receiving the Services via telemedicine include:  Delay or interruption in medical evaluation due to technological equipment failure or disruption; Information transmitted may not be sufficient  (e.g. poor resolution of images) to allow for appropriate medical decision making by the Practitioner; and/or  In rare instances, security protocols could fail, causing a breach of personal health information.  Furthermore, I acknowledge that it is my responsibility to provide information about my medical history, conditions and care that is complete and accurate to the best of my ability. I acknowledge that Practitioner's advice, recommendations, and/or decision may be based on factors not within their control, such as incomplete or inaccurate data provided by me or distortions of diagnostic images or specimens that may result from electronic transmissions. I understand that the practice of medicine is not an exact science and that Practitioner makes no warranties or guarantees regarding treatment outcomes. I acknowledge that a copy of this consent can be made available to me via my patient portal [facility] MyChart), or I can request a printed copy by calling the office of  [facility].    I understand that my insurance will be billed for this visit.   I have read or had this consent read to me. I understand the contents of this consent, which adequately explains the benefits and risks of the Services being provided via telemedicine.  I have been provided ample opportunity to ask questions regarding this consent and the Services and have had my questions answered to my satisfaction. I give my informed consent for the services to be provided through the use of telemedicine in my medical care

## 2023-04-28 ENCOUNTER — Ambulatory Visit (INDEPENDENT_AMBULATORY_CARE_PROVIDER_SITE_OTHER): Payer: 59 | Admitting: Gastroenterology

## 2023-04-28 ENCOUNTER — Encounter: Payer: Self-pay | Admitting: Gastroenterology

## 2023-04-28 VITALS — BP 118/80 | HR 72 | Ht 65.5 in | Wt 196.4 lb

## 2023-04-28 DIAGNOSIS — R634 Abnormal weight loss: Secondary | ICD-10-CM

## 2023-04-28 DIAGNOSIS — K219 Gastro-esophageal reflux disease without esophagitis: Secondary | ICD-10-CM

## 2023-04-28 DIAGNOSIS — K449 Diaphragmatic hernia without obstruction or gangrene: Secondary | ICD-10-CM

## 2023-04-28 DIAGNOSIS — Z8619 Personal history of other infectious and parasitic diseases: Secondary | ICD-10-CM

## 2023-04-28 DIAGNOSIS — Z860101 Personal history of adenomatous and serrated colon polyps: Secondary | ICD-10-CM | POA: Insufficient documentation

## 2023-04-28 DIAGNOSIS — K5909 Other constipation: Secondary | ICD-10-CM | POA: Diagnosis not present

## 2023-04-28 DIAGNOSIS — K5900 Constipation, unspecified: Secondary | ICD-10-CM

## 2023-04-28 HISTORY — DX: Constipation, unspecified: K59.00

## 2023-04-28 HISTORY — DX: Personal history of other infectious and parasitic diseases: Z86.19

## 2023-04-28 HISTORY — DX: Abnormal weight loss: R63.4

## 2023-04-28 NOTE — Patient Instructions (Addendum)
You will be due for a recall colonoscopy in 12/2027. We will send you a reminder in the mail when it gets closer to that time.  You may try to come off your PPI ( Omeprazole). You may try Over the counter Pepcid  Extra Strength 1-2 times daily, if you decide to stop your Omeprazole.   _______________________________________________________  If your blood pressure at your visit was 140/90 or greater, please contact your primary care physician to follow up on this.  _______________________________________________________  If you are age 57 or older, your body mass index should be between 23-30. Your Body mass index is 32.18 kg/m. If this is out of the aforementioned range listed, please consider follow up with your Primary Care Provider.  If you are age 79 or younger, your body mass index should be between 19-25. Your Body mass index is 32.18 kg/m. If this is out of the aformentioned range listed, please consider follow up with your Primary Care Provider.   ________________________________________________________  The [facility] providers would like to encourage you to use [facility] to communicate with providers for non-urgent requests or questions.  Due to long hold times on the telephone, sending your provider a message by [facility] may be a faster and more efficient way to get a response.  Please allow 48 business hours for a response.  Please remember that this is for non-urgent requests.  _______________________________________________________  Thank you for choosing me and [facility].  Dr. Meridee Score

## 2023-04-28 NOTE — Progress Notes (Signed)
GASTROENTEROLOGY OUTPATIENT CLINIC VISIT   Primary Care Provider Early, Sung Amabile, NP [address] [place] [address] [phone]  Patient Profile: Erica Hull is a 57 y.o. female with a pmh significant for hypothyroidism, arthritis, previous IDA, GERD, hiatal hernia, Colon polyps (SSPs).  The patient presents to the [facility] for an evaluation and management of problem(s) noted below:  Problem List 1. History of Helicobacter pylori infection   2. Gastroesophageal reflux disease without esophagitis   3. Hiatal hernia   4. Other constipation   5. History of adenomatous and serrated colon polyps   6. Loss of weight     Discussed the use of AI scribe software for clinical note transcription with the patient, who gave verbal consent to proceed.  History of Present Illness Please see prior notes for full details of HPI.  Interval History The patient presents for follow-up.  When we last saw her it was for issues of her IDA.  She reports a generally stable condition since the procedures.  She was found to have a SSP in the colon with a plan for 5-year surveillance.  She was found to have a hiatal hernia and gastritis, and biopsies showed a HP infection and was treated.  Follow-up stool antigen showed that H. pylori had been eradicated.  While off her PPI for the testing, she noted significant increase in pyrosis and acid indigestion, highlighting their reliance on the medication for symptom management.  She has not used H2RA therapy in the past.  The patient also reports regular bowel movements, occurring three to four times a week. However, she experiences bloating and abdominal discomfort as it gets closer and longer from the last BM.  She'll occasionally use generic Senokot when constipation becomes severe.  She did begin using the Fibercon supplementation daily and hasn't noticed significant impact on bowel regularity.  She reports that her last iron  indices have returned to normal.  She has lost forty pounds since starting Semaglutide.   GI Review of Systems Positive as above Negative for dysphagia, odynophagia, nausea, vomiting, melena, hematochezia  Review of Systems General: Denies fevers/chills Cardiovascular: Denies chest pain Pulmonary: Denies shortness of breath Gastroenterological: See HPI Genitourinary: Denies darkened urine Hematological: Denies easy bruising/bleeding Dermatological: Denies jaundice Psychological: Mood is stable   Medications Current Outpatient Medications  Medication Sig Dispense Refill   B Complex-C-Folic Acid (SUPER B COMPLEX/FA/VIT C PO) Take 1 tablet by mouth daily.     celecoxib (CELEBREX) 200 MG capsule Take 1 capsule (200 mg total) by mouth daily. 30 capsule 1   Cholecalciferol (D3-1000) 25 MCG (1000 UT) tablet      Cyanocobalamin (VITAMIN B 12 PO) Take 1,000 mcg by mouth daily.     Ginkgo Biloba 120 MG CAPS Take 1 tablet by mouth daily.     levothyroxine (SYNTHROID) 88 MCG tablet Take 1 tab ( ) by mouth 5 days a week and 1.5 tabs ( ) by mouth 2 days a week. 100 tablet 4   metoprolol succinate (TOPROL-XL) 25 MG 24 hr tablet TAKE 1 TABLET BY MOUTH ONCE  DAILY 90 tablet 3   omeprazole (PRILOSEC) 20 MG capsule Take 20 mg by mouth daily.     ondansetron (ZOFRAN) 8 MG tablet Take 1 tablet (8 mg total) by mouth every 8 (eight) hours as needed for nausea or vomiting. 20 tablet 3   Rhubarb (ESTROVEN COMPLETE PO) Take 1 capsule by mouth daily.     Semaglutide-Weight Management (WEGOVY) 2.4 MG/0.75ML SOAJ Inject  2.4 mg into the skin once a week. 9 mL 3   TURMERIC CURCUMIN PO Take 2,250 mg by mouth daily.     vitamin E 400 UNIT capsule Take 400 Units by mouth daily.     oxyCODONE (ROXICODONE) 5 MG immediate release tablet Take 1 tablet (5 mg total) by mouth every 6 (six) hours as needed for up to 20 doses for severe pain. (Patient not taking: Reported on 04/19/2023) 20 tablet 0   No current  facility-administered medications for this visit.    Allergies No Known Allergies  Histories Past Medical History:  Diagnosis Date   Anemia    Elective abortion    X2   Enlarged aorta (HCC)    H/O peripheral vascular disease 2006   Heart murmur    Hx of cystitis 2006   Hypertension    pregnancy induced   Hypothyroidism    Missed ab    Past Surgical History:  Procedure Laterality Date   CESAREAN SECTION     CESAREAN SECTION W/BTL     COLONOSCOPY     DILATION AND CURETTAGE OF UTERUS     ENDOMETRIAL ABLATION     HTA   ENDOSCOPIC VEIN LASER TREATMENT     UPPER GASTROINTESTINAL ENDOSCOPY     vein closure procedure Left 07/11/2004   Social History   Socioeconomic History   Marital status: Married    Spouse name: Not on file   Number of children: 2   Years of education: Not on file   Highest education level: Not on file  Occupational History   Not on file  Tobacco Use   Smoking status: Never   Smokeless tobacco: Never  Vaping Use   Vaping status: Never Used  Substance and Sexual Activity   Alcohol use: Not Currently   Drug use: No   Sexual activity: Yes    Partners: Male    Birth control/protection: Surgical    Comment: 1ST Intercourse- 20, partners- 3, btl  Other Topics Concern   Not on file  Social History Narrative   Not on file   Social Determinants of Health   Financial Resource Strain: Not on file  Food Insecurity: Not on file  Transportation Needs: Not on file  Physical Activity: Not on file  Stress: Not on file  Social Connections: Not on file  Intimate Partner Violence: Not on file   Family History  Problem Relation Age of Onset   Rheum arthritis Mother    COPD Mother    Dementia Mother    Hypertension Father    Cancer Father 73       ureter   Atrial fibrillation Father    Colon polyps Father    Colon cancer Neg Hx    Stomach cancer Neg Hx    Esophageal cancer Neg Hx    Rectal cancer Neg Hx    Inflammatory bowel disease Neg Hx     Liver disease Neg Hx    Pancreatic cancer Neg Hx    I have reviewed her medical, social, and family history in detail and updated the electronic medical record as necessary.    PHYSICAL EXAMINATION  BP 118/80 (BP Location: Left Arm, Patient Position: Sitting, Cuff Size: Large)   Pulse 72   Ht 5' 5.5" (1.664 m) Comment: height measured without shoes  Wt 196 lb 6 oz (89.1 kg)   LMP 04/09/2021 (Exact Date) Comment: sexually active,btl  BMI 32.18 kg/m  Wt Readings from Last 3 Encounters:  04/28/23 196 lb  6 oz (89.1 kg)  04/18/23 201 lb 9.6 oz (91.4 kg)  03/27/23 196 lb 3.2 oz (89 kg)  GEN: NAD, appears stated age, doesn't appear chronically ill PSYCH: Cooperative, without pressured speech EYE: Conjunctivae pink, sclerae anicteric ENT: MMM CV: Nontachycardic RESP: No audible wheezing GI: NABS, soft, protuberant abdomen, rounded, NT, without MSK/EXT: No significant lower extremity edema SKIN: No jaundice NEURO:  Alert & Oriented x 3, no focal deficits   REVIEW OF DATA  I reviewed the following data at the time of this encounter:  GI Procedures and Studies  EGD - Esophageal mucosal changes suggestive of possible lymphocytic/eosinophilic esophagitis. Biopsied. - Z-line regular, 36 cm from the incisors. - 2 cm hiatal hernia. - Granular gastric mucosa. Biopsied. - No gross lesions in the duodenal bulb, in the first portion of the duodenum and in the second portion of the duodenum. Biopsied. - Normal major papilla.  Colonoscopy - Hemorrhoids found on digital rectal exam. - The examined portion of the ileum was normal. - One 4 mm polyp in the ascending colon, removed with a cold snare. Resected and retrieved. - Normal mucosa in the entire examined colon otherwise. - Non-bleeding non-thrombosed external and internal hemorrhoids.  Pathology Diagnosis 1. Surgical [P], gastric CHRONIC ACTIVE GASTRITIS WITH LYMPHOID AGGREGATES HELICOBACTER PRESENT NEGATIVE FOR INTESTINAL METAPLASIA,  DYSPLASIA AND CARCINOMA 2. Surgical [P], duodenal BENIGN DUODENAL MUCOSA WITH NO DIAGNOSTIC ABNORMALITY 3. Surgical [P], esophagus REACTIVE SQUAMOUS MUCOSA 4. Surgical [P], colon, ascending, polyp (1) SESSILE SERRATED POLYP WITHOUT CYTOLOGIC DYSPLASIA  Laboratory Studies  Reviewed those in epic  Imaging Studies  No relevant studies to review   ASSESSMENT  Ms. Scholle is a 57 y.o. female with a pmh significant for hypothyroidism, arthritis, previous IDA, GERD, hiatal hernia, Colon polyps (SSPs).  The patient is seen today for evaluation and management of:  1. History of Helicobacter pylori infection   2. Gastroesophageal reflux disease without esophagitis   3. Hiatal hernia   4. Other constipation   5. History of adenomatous and serrated colon polyps   6. Loss of weight    The patient is hemodynamically and clinically stable at this time point.  Her findings of H. pylori with appropriate eradication are a good sign that she has had some improvement and maybe this will also help in regards to some of her previous iron deficiency.  She is certainly felt a significant change in her pyrosis symptoms when she was off of the PPI before her stool study, so she does feel that she needs to continue this for now.  We did briefly discuss that if she wanted to come off PPI therapy she could trial Pepcid once to twice daily otherwise she will let me know if she does do this or finds it to be helpful.  Patient was also counseled that at some point if she wants to come off PPI therapy, we could consider hiatal hernia repair and fundoplication.  For now she can continue her PPI.  In regards to her chronic constipation, we did discuss that she can use MiraLAX on a daily basis regularly or every other day basis regularly to see if that may be helpful.  She can also continue as she is doing with as needed laxative therapy with Senokot or Dulcolax.  If things become more significant we have other laxative  therapies that we can consider.  She may use another fiber supplement if she wishes.  Being on Wegovy may also be playing some role with her constipation.  She has no evidence of persisting iron deficiency, if she develops that again she will need a video capsule endoscopy.  All patient questions were answered to the best of my ability, and the patient agrees to the aforementioned plan of action with follow-up as indicated.   PLAN  Continue PPI 20 mg once daily If patient wants to come off PPI can trial famotidine OTC once or twice daily - She should update [place] if she does do this If iron deficiency recurs in the future, video capsule endoscopy will be recommended Colonoscopy for follow-up/surveillance in 2029 Follow-up as needed   No orders of the defined types were placed in this encounter.   New Prescriptions   No medications on file   Modified Medications   No medications on file    Planned Follow Up No follow-ups on file.   Total Time in Face-to-Face and in Coordination of Care for patient including independent/personal interpretation/review of prior testing, medical history, examination, medication adjustment, communicating results with the patient directly, and documentation within the EHR is 25 minutes.   Corliss Parish, MD [facility] Office # [phone]

## 2023-04-28 NOTE — Telephone Encounter (Signed)
Received patient's recent Mammogram report.  Copy scanned into the chart./AB/CMA

## 2023-05-02 ENCOUNTER — Other Ambulatory Visit: Payer: Self-pay

## 2023-05-02 ENCOUNTER — Encounter (HOSPITAL_BASED_OUTPATIENT_CLINIC_OR_DEPARTMENT_OTHER): Payer: Self-pay | Admitting: Plastic Surgery

## 2023-05-02 ENCOUNTER — Ambulatory Visit: Payer: 59 | Attending: Cardiology

## 2023-05-02 DIAGNOSIS — M47816 Spondylosis without myelopathy or radiculopathy, lumbar region: Secondary | ICD-10-CM | POA: Insufficient documentation

## 2023-05-02 DIAGNOSIS — Z01810 Encounter for preprocedural cardiovascular examination: Secondary | ICD-10-CM | POA: Diagnosis not present

## 2023-05-02 NOTE — Progress Notes (Signed)
Virtual Visit via Telephone Note   Because of Erica Hull's co-morbid illnesses, she is at least at moderate risk for complications without adequate follow up.  This format is felt to be most appropriate for this patient at this time.  The patient did not have access to video technology/had technical difficulties with video requiring transitioning to audio format only (telephone).  All issues noted in this document were discussed and addressed.  No physical exam could be performed with this format.  Please refer to the patient's chart for her consent to telehealth for [facility].  Evaluation Performed:  Preoperative cardiovascular risk assessment _____________   Date:  05/02/2023   Patient ID:  Erica Hull, DOB 11/19/1965, MRN [id number] Patient Location:  Home Provider location:   Office  Primary Care Provider:  Tollie Eth, NP Primary Cardiologist:  None  Chief Complaint / Patient Profile   57 y.o. y/o female with a h/o palpitations, ascending aortic dilation, hypothyroidism who is pending breast reduction and presents today for telephonic preoperative cardiovascular risk assessment.  History of Present Illness    Erica Hull is a 57 y.o. female who presents via audio/video conferencing for a telehealth visit today.  Pt was last seen in cardiology clinic on 02/14/2023 by Gillian Shields, NP.  At that time Erica Hull was doing well .  The patient is now pending procedure as outlined above. Since her last visit, she continues to be stable from a cardiac standpoint.  Today she denies chest pain, shortness of breath, lower extremity edema, fatigue, palpitations, melena, hematuria, hemoptysis, diaphoresis, weakness, presyncope, syncope, orthopnea, and PND.   Past Medical History    Past Medical History:  Diagnosis Date   Anemia    Elective abortion    X2   Enlarged aorta (HCC)    H/O peripheral vascular disease 2006   Heart murmur    Hx of cystitis  2006   Hypertension    pregnancy induced   Hypothyroidism    Missed ab    Past Surgical History:  Procedure Laterality Date   CESAREAN SECTION     CESAREAN SECTION W/BTL     COLONOSCOPY     DILATION AND CURETTAGE OF UTERUS     ENDOMETRIAL ABLATION     HTA   ENDOSCOPIC VEIN LASER TREATMENT     UPPER GASTROINTESTINAL ENDOSCOPY     vein closure procedure Left 07/11/2004    Allergies  No Known Allergies  Home Medications    Prior to Admission medications   Medication Sig Start Date End Date Taking? Authorizing Provider  B Complex-C-Folic Acid (SUPER B COMPLEX/FA/VIT C PO) Take 1 tablet by mouth daily.    [provider]  celecoxib (CELEBREX) 200 MG capsule Take 1 capsule (200 mg total) by mouth daily. 04/04/23     Cholecalciferol (D3-1000) 25 MCG (1000 UT) tablet     [provider]  Cyanocobalamin (VITAMIN B 12 PO) Take 1,000 mcg by mouth daily.    [provider]  Ginkgo Biloba 120 MG CAPS Take 1 tablet by mouth daily.    [provider]  levothyroxine (SYNTHROID) 88 MCG tablet Take 1 tab ( ) by mouth 5 days a week and 1.5 tabs ( ) by mouth 2 days a week. 04/04/23   Tollie Eth, NP  metoprolol succinate (TOPROL-XL) 25 MG 24 hr tablet TAKE 1 TABLET BY MOUTH ONCE  DAILY 03/22/23   Alver Sorrow, NP  omeprazole (PRILOSEC) 20 MG capsule Take  20 mg by mouth daily.    [provider]  ondansetron (ZOFRAN) 8 MG tablet Take 1 tablet (8 mg total) by mouth every 8 (eight) hours as needed for nausea or vomiting. 01/25/22   Early, Sung Amabile, NP  oxyCODONE (ROXICODONE) 5 MG immediate release tablet Take 1 tablet (5 mg total) by mouth every 6 (six) hours as needed for up to 20 doses for severe pain. Patient not taking: Reported on 04/19/2023 04/18/23   Laurena Spies, PA-C  Rhubarb (ESTROVEN COMPLETE PO) Take 1 capsule by mouth daily.    [provider]  Semaglutide-Weight Management (WEGOVY) 2.4 MG/0.75ML SOAJ Inject 2.4 mg into  the skin once a week. 08/18/22   Tollie Eth, NP  TURMERIC CURCUMIN PO Take 2,250 mg by mouth daily.    [provider]  vitamin E 400 UNIT capsule Take 400 Units by mouth daily.    [provider]    Physical Exam    Vital Signs:  Erica Hull does not have vital signs available for review today.  Given telephonic nature of communication, physical exam is limited. AAOx3. NAD. Normal affect.  Speech and respirations are unlabored.  Accessory Clinical Findings    None  Assessment & Plan    1.  Preoperative Cardiovascular Risk Assessment: Breast reduction, 05/11/2023, Dr. Foster Simpson, [facility], fax number [phone]      Primary Cardiologist: Gillian Shields, NP  Chart reviewed as part of pre-operative protocol coverage. Given past medical history and time since last visit, based on ACC/AHA guidelines, Erica Hull would be at acceptable risk for the planned procedure without further cardiovascular testing.   Her RCRI is very low, 0.4% risk of major cardiac event.  She is able to complete greater than 4 METS of physical activity.  Patient was advised that if he/she develops new symptoms prior to surgery to contact our office to arrange a follow-up appointment.  She verbalized understanding.  I will route this recommendation to the requesting party via Epic fax function and remove from pre-op pool.       Time:   Today, I have spent  5 minutes with the patient with telehealth technology discussing medical history, symptoms, and management plan.  Prior to patient's phone evaluation I spent greater than 10 minutes reviewing their past medical history and cardiac medications.    Ronney Asters, NP  05/02/2023, 7:13 AM

## 2023-05-10 ENCOUNTER — Encounter (HOSPITAL_BASED_OUTPATIENT_CLINIC_OR_DEPARTMENT_OTHER)
Admission: RE | Disposition: A | Discharge status: Home / Self Care | Payer: Self-pay | Source: Home / Self Care | Attending: Plastic Surgery

## 2023-05-10 ENCOUNTER — Encounter (HOSPITAL_BASED_OUTPATIENT_CLINIC_OR_DEPARTMENT_OTHER): Payer: Self-pay | Admitting: Plastic Surgery

## 2023-05-10 ENCOUNTER — Ambulatory Visit (HOSPITAL_BASED_OUTPATIENT_CLINIC_OR_DEPARTMENT_OTHER): Payer: 59 | Admitting: Anesthesiology

## 2023-05-10 ENCOUNTER — Other Ambulatory Visit: Payer: Self-pay

## 2023-05-10 ENCOUNTER — Ambulatory Visit (HOSPITAL_BASED_OUTPATIENT_CLINIC_OR_DEPARTMENT_OTHER)
Admission: RE | Admit: 2023-05-10 | Discharge: 2023-05-10 | Disposition: A | Discharge status: Home / Self Care | Payer: 59 | Attending: Plastic Surgery | Admitting: Plastic Surgery

## 2023-05-10 DIAGNOSIS — M549 Dorsalgia, unspecified: Secondary | ICD-10-CM | POA: Insufficient documentation

## 2023-05-10 DIAGNOSIS — N62 Hypertrophy of breast: Secondary | ICD-10-CM | POA: Insufficient documentation

## 2023-05-10 DIAGNOSIS — Z98891 History of uterine scar from previous surgery: Secondary | ICD-10-CM | POA: Insufficient documentation

## 2023-05-10 DIAGNOSIS — I10 Essential (primary) hypertension: Secondary | ICD-10-CM | POA: Diagnosis not present

## 2023-05-10 DIAGNOSIS — I719 Aortic aneurysm of unspecified site, without rupture: Secondary | ICD-10-CM | POA: Diagnosis not present

## 2023-05-10 DIAGNOSIS — E039 Hypothyroidism, unspecified: Secondary | ICD-10-CM | POA: Diagnosis not present

## 2023-05-10 DIAGNOSIS — I493 Ventricular premature depolarization: Secondary | ICD-10-CM | POA: Diagnosis not present

## 2023-05-10 DIAGNOSIS — Z8759 Personal history of other complications of pregnancy, childbirth and the puerperium: Secondary | ICD-10-CM | POA: Insufficient documentation

## 2023-05-10 DIAGNOSIS — M542 Cervicalgia: Secondary | ICD-10-CM | POA: Insufficient documentation

## 2023-05-10 HISTORY — PX: BREAST REDUCTION SURGERY: SHX8

## 2023-05-10 SURGERY — BREAST REDUCTION WITH LIPOSUCTION
Anesthesia: General | Site: Breast | Laterality: Bilateral

## 2023-05-10 MED ORDER — DEXAMETHASONE SODIUM PHOSPHATE 4 MG/ML IJ SOLN
INTRAMUSCULAR | Status: DC | PRN
  Administered 2023-05-10: 5 mg via INTRAVENOUS

## 2023-05-10 MED ORDER — FENTANYL CITRATE (PF) 100 MCG/2ML IJ SOLN
INTRAMUSCULAR | Status: DC | PRN
  Administered 2023-05-10: 50 ug via INTRAVENOUS
  Administered 2023-05-10: 100 ug via INTRAVENOUS

## 2023-05-10 MED ORDER — SCOPOLAMINE 1 MG/3DAYS TD PT72: 1.0000 | MEDICATED_PATCH | TRANSDERMAL | Status: DC

## 2023-05-10 MED ORDER — SUCCINYLCHOLINE CHLORIDE 200 MG/10ML IV SOSY
PREFILLED_SYRINGE | INTRAVENOUS | Status: AC
  Filled 2023-05-10: qty 10

## 2023-05-10 MED ORDER — SODIUM CHLORIDE 0.9 % IV SOLN
INTRAVENOUS | Status: DC | PRN
  Administered 2023-05-10: 40 mL

## 2023-05-10 MED ORDER — FENTANYL CITRATE (PF) 100 MCG/2ML IJ SOLN
INTRAMUSCULAR | Status: AC
  Filled 2023-05-10: qty 2

## 2023-05-10 MED ORDER — SODIUM CHLORIDE (PF) 0.9 % IJ SOLN
INTRAMUSCULAR | Status: DC | PRN
  Administered 2023-05-10: 20 mL

## 2023-05-10 MED ORDER — MIDAZOLAM HCL 2 MG/2ML IJ SOLN
INTRAMUSCULAR | Status: AC
  Filled 2023-05-10: qty 2

## 2023-05-10 MED ORDER — LIDOCAINE 2% (20 MG/ML) 5 ML SYRINGE
INTRAMUSCULAR | Status: AC
  Filled 2023-05-10: qty 5

## 2023-05-10 MED ORDER — OXYCODONE HCL 5 MG/5ML PO SOLN: 5.0000 mg | Freq: Once | ORAL | Status: DC | PRN

## 2023-05-10 MED ORDER — VASHE WOUND IRRIGATION OPTIME
TOPICAL | Status: DC | PRN
  Administered 2023-05-10: 34 [oz_av]

## 2023-05-10 MED ORDER — PROPOFOL 10 MG/ML IV BOLUS
INTRAVENOUS | Status: DC | PRN
  Administered 2023-05-10: 200 mg via INTRAVENOUS

## 2023-05-10 MED ORDER — CELECOXIB 200 MG PO CAPS
200.0000 mg | ORAL_CAPSULE | Freq: Once | ORAL | Status: AC
  Administered 2023-05-10: 200 mg via ORAL

## 2023-05-10 MED ORDER — CEFAZOLIN SODIUM-DEXTROSE 2-4 GM/100ML-% IV SOLN
INTRAVENOUS | Status: AC
  Filled 2023-05-10: qty 100

## 2023-05-10 MED ORDER — LIDOCAINE 2% (20 MG/ML) 5 ML SYRINGE
INTRAMUSCULAR | Status: DC | PRN
  Administered 2023-05-10: 60 mg via INTRAVENOUS

## 2023-05-10 MED ORDER — CELECOXIB 200 MG PO CAPS
ORAL_CAPSULE | ORAL | Status: AC
  Filled 2023-05-10: qty 1

## 2023-05-10 MED ORDER — CEFAZOLIN SODIUM-DEXTROSE 2-4 GM/100ML-% IV SOLN
2.0000 g | INTRAVENOUS | Status: AC
  Administered 2023-05-10: 2 g via INTRAVENOUS

## 2023-05-10 MED ORDER — CHLORHEXIDINE GLUCONATE CLOTH 2 % EX PADS: 6.0000 | MEDICATED_PAD | Freq: Once | CUTANEOUS | Status: DC

## 2023-05-10 MED ORDER — ACETAMINOPHEN 500 MG PO TABS
ORAL_TABLET | ORAL | Status: AC
  Filled 2023-05-10: qty 2

## 2023-05-10 MED ORDER — ONDANSETRON HCL 4 MG/2ML IJ SOLN
INTRAMUSCULAR | Status: DC | PRN
  Administered 2023-05-10: 4 mg via INTRAVENOUS

## 2023-05-10 MED ORDER — LIDOCAINE HCL 1 % IJ SOLN
INTRAVENOUS | Status: DC | PRN
  Administered 2023-05-10: 400 mL

## 2023-05-10 MED ORDER — LIDOCAINE-EPINEPHRINE 1 %-1:100000 IJ SOLN
INTRAMUSCULAR | Status: DC | PRN
  Administered 2023-05-10: 50 mL via INTRAMUSCULAR

## 2023-05-10 MED ORDER — ACETAMINOPHEN 500 MG PO TABS
1000.0000 mg | ORAL_TABLET | Freq: Once | ORAL | Status: AC
  Administered 2023-05-10: 1000 mg via ORAL

## 2023-05-10 MED ORDER — OXYCODONE HCL 5 MG PO TABS: 5.0000 mg | ORAL_TABLET | Freq: Once | ORAL | Status: DC | PRN

## 2023-05-10 MED ORDER — ATROPINE SULFATE 0.4 MG/ML IV SOLN
INTRAVENOUS | Status: AC
  Filled 2023-05-10: qty 1

## 2023-05-10 MED ORDER — LACTATED RINGERS IV SOLN: INTRAVENOUS | Status: DC

## 2023-05-10 MED ORDER — EPHEDRINE 5 MG/ML INJ
INTRAVENOUS | Status: AC
  Filled 2023-05-10: qty 5

## 2023-05-10 MED ORDER — AMISULPRIDE (ANTIEMETIC) 5 MG/2ML IV SOLN: 10.0000 mg | Freq: Once | INTRAVENOUS | Status: DC | PRN

## 2023-05-10 MED ORDER — PROPOFOL 10 MG/ML IV BOLUS
INTRAVENOUS | Status: AC
  Filled 2023-05-10: qty 20

## 2023-05-10 MED ORDER — EPHEDRINE SULFATE (PRESSORS) 50 MG/ML IJ SOLN
INTRAMUSCULAR | Status: DC | PRN
  Administered 2023-05-10: 10 mg via INTRAVENOUS

## 2023-05-10 MED ORDER — MIDAZOLAM HCL 5 MG/5ML IJ SOLN
INTRAMUSCULAR | Status: DC | PRN
  Administered 2023-05-10: 2 mg via INTRAVENOUS

## 2023-05-10 MED ORDER — FENTANYL CITRATE (PF) 100 MCG/2ML IJ SOLN: 25.0000 ug | INTRAMUSCULAR | Status: DC | PRN

## 2023-05-10 MED ORDER — PHENYLEPHRINE 80 MCG/ML (10ML) SYRINGE FOR IV PUSH (FOR BLOOD PRESSURE SUPPORT)
PREFILLED_SYRINGE | INTRAVENOUS | Status: AC
  Filled 2023-05-10: qty 10

## 2023-05-10 MED ORDER — SODIUM CHLORIDE 0.9 % IV SOLN: 12.5000 mg | INTRAVENOUS | Status: DC | PRN

## 2023-05-10 SURGICAL SUPPLY — 68 items
ADH SKN CLS APL DERMABOND .7 (GAUZE/BANDAGES/DRESSINGS) ×4
AGENT HMST PWDR BTL CLGN 5GM (Miscellaneous) ×1 IMPLANT
BAG DECANTER FOR FLEXI CONT (MISCELLANEOUS) ×1 IMPLANT
BINDER BREAST LRG (GAUZE/BANDAGES/DRESSINGS) IMPLANT
BINDER BREAST MEDIUM (GAUZE/BANDAGES/DRESSINGS) IMPLANT
BINDER BREAST XLRG (GAUZE/BANDAGES/DRESSINGS) IMPLANT
BINDER BREAST XXLRG (GAUZE/BANDAGES/DRESSINGS) IMPLANT
BIOPATCH RED 1 DISK 7.0 (GAUZE/BANDAGES/DRESSINGS) IMPLANT
BLADE HEX COATED 2.75 (ELECTRODE) IMPLANT
BLADE KNIFE PERSONA 10 (BLADE) ×2 IMPLANT
BLADE SURG 15 STRL LF DISP TIS (BLADE) ×1 IMPLANT
BLADE SURG 15 STRL SS (BLADE) ×1
CANISTER SUCT 1200ML W/VALVE (MISCELLANEOUS) ×1 IMPLANT
CLEANSER WND VASHE 34 (WOUND CARE) IMPLANT
COLLAGEN CELLERATERX 5 GRAM (Miscellaneous) IMPLANT
COVER BACK TABLE 60X90IN (DRAPES) ×1 IMPLANT
COVER MAYO STAND STRL (DRAPES) ×1 IMPLANT
DERMABOND ADVANCED .7 DNX12 (GAUZE/BANDAGES/DRESSINGS) ×2 IMPLANT
DRAIN CHANNEL 19F RND (DRAIN) IMPLANT
DRAPE LAPAROSCOPIC ABDOMINAL (DRAPES) ×1 IMPLANT
DRAPE UTILITY XL STRL (DRAPES) ×1 IMPLANT
DRSG MEPILEX POST OP 4X8 (GAUZE/BANDAGES/DRESSINGS) ×2 IMPLANT
ELECT BLADE 4.0 EZ CLEAN MEGAD (MISCELLANEOUS) ×1
ELECT REM PT RETURN 9FT ADLT (ELECTROSURGICAL) ×1
ELECTRODE BLDE 4.0 EZ CLN MEGD (MISCELLANEOUS) ×1 IMPLANT
ELECTRODE REM PT RTRN 9FT ADLT (ELECTROSURGICAL) ×1 IMPLANT
EVACUATOR SILICONE 100CC (DRAIN) IMPLANT
GAUZE PAD ABD 8X10 STRL (GAUZE/BANDAGES/DRESSINGS) ×2 IMPLANT
GLOVE BIO SURGEON STRL SZ 6.5 (GLOVE) ×3 IMPLANT
GLOVE BIO SURGEON STRL SZ7.5 (GLOVE) ×1 IMPLANT
GLOVE BIOGEL PI IND STRL 7.0 (GLOVE) IMPLANT
GLOVE BIOGEL PI IND STRL 8 (GLOVE) IMPLANT
GOWN STRL REUS W/ TWL LRG LVL3 (GOWN DISPOSABLE) ×2 IMPLANT
GOWN STRL REUS W/ TWL XL LVL3 (GOWN DISPOSABLE) ×1 IMPLANT
GOWN STRL REUS W/TWL LRG LVL3 (GOWN DISPOSABLE) ×4
GOWN STRL REUS W/TWL XL LVL3 (GOWN DISPOSABLE)
NDL FILTER BLUNT 18X1 1/2 (NEEDLE) IMPLANT
NDL HYPO 25X1 1.5 SAFETY (NEEDLE) ×2 IMPLANT
NEEDLE FILTER BLUNT 18X1 1/2 (NEEDLE) ×1
NEEDLE HYPO 25X1 1.5 SAFETY (NEEDLE) ×2
NS IRRIG 1000ML POUR BTL (IV SOLUTION) ×1 IMPLANT
PACK BASIN DAY SURGERY FS (CUSTOM PROCEDURE TRAY) ×1 IMPLANT
PAD ALCOHOL SWAB (MISCELLANEOUS) IMPLANT
PAD FOAM SILICONE BACKED (GAUZE/BANDAGES/DRESSINGS) IMPLANT
PENCIL SMOKE EVACUATOR (MISCELLANEOUS) ×1 IMPLANT
PIN SAFETY STERILE (MISCELLANEOUS) IMPLANT
SLEEVE SCD COMPRESS KNEE MED (STOCKING) ×1 IMPLANT
SPIKE FLUID TRANSFER (MISCELLANEOUS) IMPLANT
SPONGE T-LAP 18X18 ~~LOC~~+RFID (SPONGE) ×2 IMPLANT
STRIP SUTURE WOUND CLOSURE 1/2 (MISCELLANEOUS) ×2 IMPLANT
SUT MNCRL AB 4-0 PS2 18 (SUTURE) ×4 IMPLANT
SUT MON AB 3-0 SH 27 (SUTURE) ×8
SUT MON AB 3-0 SH27 (SUTURE) ×4 IMPLANT
SUT MON AB 5-0 PS2 18 (SUTURE) IMPLANT
SUT PDS 3-0 CT2 (SUTURE) ×6
SUT PDS II 3-0 CT2 27 ABS (SUTURE) ×4 IMPLANT
SUT SILK 3 0 PS 1 (SUTURE) IMPLANT
SYR 50ML LL SCALE MARK (SYRINGE) IMPLANT
SYR BULB IRRIG 60ML STRL (SYRINGE) ×1 IMPLANT
SYR CONTROL 10ML LL (SYRINGE) ×2 IMPLANT
TAPE MEASURE VINYL STERILE (MISCELLANEOUS) IMPLANT
TOWEL GREEN STERILE FF (TOWEL DISPOSABLE) ×3 IMPLANT
TRAY DSU PREP LF (CUSTOM PROCEDURE TRAY) ×1 IMPLANT
TUBE CONNECTING 20X1/4 (TUBING) ×1 IMPLANT
TUBING INFILTRATION IT-10001 (TUBING) IMPLANT
TUBING SET GRADUATE ASPIR 12FT (MISCELLANEOUS) IMPLANT
UNDERPAD 30X36 HEAVY ABSORB (UNDERPADS AND DIAPERS) ×2 IMPLANT
YANKAUER SUCT BULB TIP NO VENT (SUCTIONS) ×1 IMPLANT

## 2023-05-10 NOTE — Anesthesia Procedure Notes (Signed)
Procedure Name: LMA Insertion Date/Time: 05/10/2023 11:30 AM  Performed by: Ronnette Hila, CRNAPre-anesthesia Checklist: Patient identified, Emergency Drugs available, Suction available and Patient being monitored Patient Re-evaluated:Patient Re-evaluated prior to induction Oxygen Delivery Method: Circle System Utilized Preoxygenation: Pre-oxygenation with 100% oxygen Induction Type: IV induction Ventilation: Mask ventilation without difficulty LMA: LMA inserted LMA Size: 4.0 Number of attempts: 1 Airway Equipment and Method: bite block Placement Confirmation: positive ETCO2 Tube secured with: Tape Dental Injury: Teeth and Oropharynx as per pre-operative assessment

## 2023-05-10 NOTE — Anesthesia Preprocedure Evaluation (Addendum)
Anesthesia Evaluation  Patient identified by MRN, date of birth, ID band Patient awake    Reviewed: Allergy & Precautions, NPO status , Patient's Chart, lab work & pertinent test results, reviewed documented beta blocker date and time   History of Anesthesia Complications Negative for: history of anesthetic complications  Airway Mallampati: II  TM Distance: >3 FB Neck ROM: Full    Dental  (+) Dental Advisory Given, Teeth Intact   Pulmonary neg pulmonary ROS   Pulmonary exam normal        Cardiovascular hypertension, Pt. on home beta blockers and Pt. on medications Normal cardiovascular exam   '20 TTE - EF 60-65%. Trivial pericardial effusion is present. No valve d/o. There is mild dilatation of the ascending aorta measuring 40 mm.     Neuro/Psych negative neurological ROS  negative psych ROS   GI/Hepatic Neg liver ROS, hiatal hernia,GERD  Medicated and Controlled,,  Endo/Other  Hypothyroidism   Obesity   Renal/GU negative Renal ROS     Musculoskeletal negative musculoskeletal ROS (+)    Abdominal   Peds  Hematology negative hematology ROS (+)   Anesthesia Other Findings On GLP-1a   Reproductive/Obstetrics                             Anesthesia Physical Anesthesia Plan  ASA: 2  Anesthesia Plan: General   Post-op Pain Management: Tylenol PO (pre-op)* and Celebrex PO (pre-op)*   Induction: Intravenous  PONV Risk Score and Plan: 3 and Treatment may vary due to age or medical condition, Ondansetron, Dexamethasone, Midazolam and Scopolamine patch - Pre-op  Airway Management Planned: LMA  Additional Equipment: None  Intra-op Plan:   Post-operative Plan: Extubation in OR  Informed Consent: I have reviewed the patients History and Physical, chart, labs and discussed the procedure including the risks, benefits and alternatives for the proposed anesthesia with the patient or  authorized representative who has indicated his/her understanding and acceptance.     Dental advisory given  Plan Discussed with: CRNA and Anesthesiologist  Anesthesia Plan Comments:         Anesthesia Quick Evaluation

## 2023-05-10 NOTE — Anesthesia Postprocedure Evaluation (Signed)
Anesthesia Post Note  Patient: Erica Hull  Procedure(s) Performed: BREAST REDUCTION WITH LIPOSUCTION (Bilateral: Breast)     Patient location during evaluation: PACU Anesthesia Type: General Level of consciousness: awake and alert Pain management: pain level controlled Vital Signs Assessment: post-procedure vital signs reviewed and stable Respiratory status: spontaneous breathing, nonlabored ventilation and respiratory function stable Cardiovascular status: stable and blood pressure returned to baseline Anesthetic complications: no   No notable events documented.  Last Vitals:  Vitals:   05/10/23 1400 05/10/23 1436  BP: (!) 143/87 (!) 151/89  Pulse:  88  Resp:  15  Temp:  (!) 36.3 C  SpO2: 95% 95%    Last Pain:  Vitals:   05/10/23 1436  TempSrc: Temporal  PainSc:                  Beryle Lathe

## 2023-05-10 NOTE — Interval H&P Note (Signed)
History and Physical Interval Note:  05/10/2023 10:44 AM  Erica Hull  has presented today for surgery, with the diagnosis of macromastia.  The various methods of treatment have been discussed with the patient and family. After consideration of risks, benefits and other options for treatment, the patient has consented to  Procedure(s): BREAST REDUCTION WITH LIPOSUCTION (Bilateral) as a surgical intervention.  The patient's history has been reviewed, patient examined, no change in status, stable for surgery.  I have reviewed the patient's chart and labs.  Questions were answered to the patient's satisfaction.     Alena Bills Erine Phenix

## 2023-05-10 NOTE — Transfer of Care (Signed)
Immediate Anesthesia Transfer of Care Note  Patient: Lulamae Thielbar Grabel  Procedure(s) Performed: BREAST REDUCTION WITH LIPOSUCTION (Bilateral: Breast)  Patient Location: PACU  Anesthesia Type:General  Level of Consciousness: awake, alert , oriented, drowsy, and patient cooperative  Airway & Oxygen Therapy: Patient Spontanous Breathing and Patient connected to face mask oxygen  Post-op Assessment: Report given to RN and Post -op Vital signs reviewed and stable  Post vital signs: Reviewed and stable  Last Vitals:  Vitals Value Taken Time  BP    Temp    Pulse 94 05/10/23 1340  Resp 12 05/10/23 1340  SpO2 91 % 05/10/23 1340  Vitals shown include unfiled device data.  Last Pain:  Vitals:   05/10/23 0931  TempSrc: Oral  PainSc: 0-No pain      Patients Stated Pain Goal: 3 (05/10/23 0931)  Complications: No notable events documented.

## 2023-05-10 NOTE — Op Note (Signed)
Breast Reduction Op note:    DATE OF PROCEDURE: 05/10/2023  LOCATION: [facility]  SURGEON: Foster Simpson, DO  ASSISTANT: Caroline More, PA  PREOPERATIVE DIAGNOSIS 1. Macromastia 2. Neck Pain 3. Back Pain  POSTOPERATIVE DIAGNOSIS 1. Macromastia 2. Neck Pain 3. Back Pain  PROCEDURES 1. Bilateral breast reduction.  Right reduction 580 g, Left reduction 750 g  COMPLICATIONS: None.  DRAINS: none  INDICATIONS FOR PROCEDURE Erica Hull is a 57 y.o. year-old female born on 11/10/1965,with a history of symptomatic macromastia with concominant back pain, neck pain, shoulder grooving from her bra.   MRN: [id number]  CONSENT Informed consent was obtained directly from the patient. The risks, benefits and alternatives were fully discussed. Specific risks including but not limited to bleeding, infection, hematoma, seroma, scarring, pain, nipple necrosis, asymmetry, poor cosmetic results, and need for further surgery were discussed. The patient's questions were answered.  DESCRIPTION OF PROCEDURE  Patient was brought into the operating room and rested on the operating room table in the supine position.  SCDs were placed and appropriate padding was performed.  Antibiotics were given. The patient underwent general anesthesia and the chest was prepped and draped in a sterile fashion.  A timeout was performed and all information was confirmed to be correct by those in the room. Tumescent was placed in the lateral breast on each side.  Liposuction was done laterally on each breast for improved contour and symmetry.  Right side: Preoperative markings were confirmed.  Incision lines were injected with local containing epinephrine.  After waiting for vasoconstriction, the marked lines were incised with a #15 blade.  A Wise-pattern superomedial breast reduction was performed by de-epithelializing the pedicle, using bovie to create the superomedial pedicle, and removing  breast tissue from the superior, lateral, and inferior portions of the breast.  Experel and Cellerate were placed in the pocket. Care was taken to not undermine the breast pedicle. Hemostasis was achieved.  The nipple was gently rotated into position and the soft tissue closed with 4-0 Monocryl.   The pocket was irrigated and hemostasis confirmed.  The deep tissues were approximated with 3-0 PDS sutures.  The skin was closed with deep dermal 3-0 Monocryl and subcuticular 4-0 Monocryl sutures.  The nipple and skin flaps had good capillary refill at the end of the procedure.    Left side: Preoperative markings were confirmed.  Incision lines were injected with local containing epinephrine.  After waiting for vasoconstriction, the marked lines were incised with a #15 blade.  A Wise-pattern superomedial breast reduction was performed by de-epithelializing the pedicle, using bovie to create the superomedial pedicle, and removing breast tissue from the superior, lateral, and inferior portions of the breast.  Experel and Cellerate were placed in the pocket. Care was taken to not undermine the breast pedicle. Hemostasis was achieved.  The nipple was gently rotated into position and the soft tissue was closed with 4-0 Monocryl.  The patient was sat upright and size and shape symmetry was confirmed.  The pocket was irrigated and hemostasis confirmed.  The deep tissues were approximated with 3-0 PDS sutures. The skin was closed with deep dermal 3-0 Monocryl and subcuticular 4-0 Monocryl sutures.  Dermabond was applied.  A breast binder and ABDs were placed.  The nipple and skin flaps had good capillary refill at the end of the procedure.  The patient tolerated the procedure well. The patient was allowed to wake from anesthesia and taken to the recovery room in satisfactory condition.  The advanced practice practitioner (APP) assisted throughout the case.  The APP was essential in retraction and counter traction when needed  to make the case progress smoothly.  This retraction and assistance made it possible to see the tissue plans for the procedure.  The assistance was needed for blood control, tissue re-approximation and assisted with closure of the incision site.

## 2023-05-10 NOTE — Discharge Instructions (Addendum)
INSTRUCTIONS FOR AFTER BREAST SURGERY   You will likely have some questions about what to expect following your operation.  The following information will help you and your family understand what to expect when you are discharged from the hospital.  It is important to follow these guidelines to help ensure a smooth recovery and reduce complication.  Postoperative instructions include information on: diet, wound care, medications and physical activity.  AFTER SURGERY Expect to go home after the procedure.  In some cases, you may need to spend one night in the hospital for observation.  DIET Breast surgery does not require a specific diet.  However, the healthier you eat the better your body will heal. It is important to increasing your protein intake.  This means limiting the foods with sugar and carbohydrates.  Focus on vegetables and some meat.  If you have liposuction during your procedure be sure to drink water.  If your urine is bright yellow, then it is concentrated, and you need to drink more water.  As a general rule after surgery, you should have 8 ounces of water every hour while awake.  If you find you are persistently nauseated or unable to take in liquids let us know.  NO TOBACCO USE or EXPOSURE.  This will slow your healing process and lead to a wound.  WOUND CARE Leave the binder on for 3 days . Use fragrance free soap like Dial, Dove or [place].   After 3 days you can remove the binder to shower. Once dry apply binder or sports bra. If you have liposuction you will have a soft and spongy dressing (Lipofoam) that helps prevent creases in your skin.  Remove before you shower and then replace it.  It is also available on [employer]. If you have steri-strips / tape directly attached to your skin leave them in place. It is OK to get these wet.   No baths, pools or hot tubs for four weeks. We close your incision to leave the smallest and best-looking scar. No ointment or creams on your incisions  for four weeks.  No Neosporin (Too many skin reactions).  A few weeks after surgery you can use Mederma and start massaging the scar. We ask you to wear your binder or sports bra for the first 6 weeks around the clock, including while sleeping. This provides added comfort and helps reduce the fluid accumulation at the surgery site. NO Ice or heating pads to the operative site.  You have a very high risk of a BURN before you feel the temperature change.  ACTIVITY No heavy lifting until cleared by the doctor.  This usually means no more than a half-gallon of milk.  It is OK to walk and climb stairs. Moving your legs is very important to decrease your risk of a blood clot.  It will also help keep you from getting deconditioned.  Every 1 to 2 hours get up and walk for 5 minutes. This will help with a quicker recovery back to normal.  Let pain be your guide so you don't do too much.  This time is for you to recover.  You will be more comfortable if you sleep and rest with your head elevated either with a few pillows under you or in a recliner.  No stomach sleeping for a three months.  WORK Everyone returns to work at different times. As a rough guide, most people take at least 1 - 2 weeks off prior to returning to work. If  you need documentation for your job, give the forms to the front staff at the clinic.  DRIVING Arrange for someone to bring you home from the hospital after your surgery.  You may be able to drive a few days after surgery but not while taking any narcotics or valium.  BOWEL MOVEMENTS Constipation can occur after anesthesia and while taking pain medication.  It is important to stay ahead for your comfort.  We recommend taking Milk of Magnesia (2 tablespoons; twice a day) while taking the pain pills.  MEDICATIONS You may be prescribed should start after surgery At your preoperative visit for you history and physical you may have been given the following medications: An antibiotic: Start  this medication when you get home and take according to the instructions on the bottle. Zofran 4 mg:  This is to treat nausea and vomiting.  You can take this every 6 hours as needed and only if needed. Valium 2 mg for breast cancer patients: This is for muscle tightness if you have an implant or expander. This will help relax your muscle which also helps with pain control.  This can be taken every 12 hours as needed. Don't drive after taking this medication. Norco (hydrocodone/acetaminophen) 5/325 mg:  This is only to be used after you have taken the Motrin or the Tylenol. Every 8 hours as needed.   Over the counter Medication to take: Ibuprofen (Motrin) 600 mg:  Take this every 6 hours.  If you have additional pain then take 500 mg of the Tylenol every 8 hours.  Only take the Norco after you have tried these two. MiraLAX or Milk of Magnesia: Take this according to the bottle if you take the Norco.  WHEN TO CALL Call your surgeon's office if any of the following occur: Fever 101 degrees F or greater Excessive bleeding or fluid from the incision site. Pain that increases over time without aid from the medications Redness, warmth, or pus draining from incision sites Persistent nausea or inability to take in liquids Severe misshapen area that underwent the operation.  Post Anesthesia Home Care Instructions  Activity: Get plenty of rest for the remainder of the day. A responsible individual must stay with you for 24 hours following the procedure.  For the next 24 hours, DO NOT: -Drive a car -Advertising copywriter -Drink alcoholic beverages -Take any medication unless instructed by your physician -Make any legal decisions or sign important papers.  Meals: Start with liquid foods such as gelatin or soup. Progress to regular foods as tolerated. Avoid greasy, spicy, heavy foods. If nausea and/or vomiting occur, drink only clear liquids until the nausea and/or vomiting subsides. Call your  physician if vomiting continues.  Special Instructions/Symptoms: Your throat may feel dry or sore from the anesthesia or the breathing tube placed in your throat during surgery. If this causes discomfort, gargle with warm salt water. The discomfort should disappear within 24 hours.  If you had a scopolamine patch placed behind your ear for the management of post- operative nausea and/or vomiting:  1. The medication in the patch is effective for 72 hours, after which it should be removed.  Wrap patch in a tissue and discard in the trash. Wash hands thoroughly with soap and water. 2. You may remove the patch earlier than 72 hours if you experience unpleasant side effects which may include dry mouth, dizziness or visual disturbances. 3. Avoid touching the patch. Wash your hands with soap and water after contact with the patch.  No Tylenol until 3:30pm

## 2023-05-11 ENCOUNTER — Encounter (HOSPITAL_BASED_OUTPATIENT_CLINIC_OR_DEPARTMENT_OTHER): Payer: Self-pay | Admitting: Plastic Surgery

## 2023-05-11 NOTE — Progress Notes (Signed)
Patient is a pleasant 57 year old female s/p bilateral breast reduction performed 05/10/2023 Bakkar Dillingham who joins via telephone for postoperative day 2 check-in.  Reviewed operative report and nearly 600 g removed from right breast, 750 g removed from the larger left breast.  Liposuction was done laterally on each breast for improved contour and symmetry.  No drains were placed.    Today, patient is doing exceedingly well.  She is a bit sore, but has only required periodic Tylenol and ibuprofen for pain control as needed.  She has not required any of the prescription analgesics.  She is tolerating p.o. intake without difficulty, voiding.  Ambulatory at home.  Denies any leg swelling, chest pain, difficulty breathing, fevers, or other symptoms.  Her husband is at home assisting with her postoperative recovery.  Plan for in person evaluation and follow-up next week, as scheduled.  She can call the office should she have any questions or concerns in the interim.

## 2023-05-12 ENCOUNTER — Ambulatory Visit (INDEPENDENT_AMBULATORY_CARE_PROVIDER_SITE_OTHER): Payer: 59 | Admitting: Physician Assistant

## 2023-05-12 DIAGNOSIS — Z9889 Other specified postprocedural states: Secondary | ICD-10-CM

## 2023-05-12 LAB — SURGICAL PATHOLOGY

## 2023-05-19 ENCOUNTER — Ambulatory Visit (INDEPENDENT_AMBULATORY_CARE_PROVIDER_SITE_OTHER): Payer: 59 | Admitting: Plastic Surgery

## 2023-05-19 ENCOUNTER — Encounter: Payer: Self-pay | Admitting: Plastic Surgery

## 2023-05-19 VITALS — BP 139/89 | HR 77

## 2023-05-19 DIAGNOSIS — N62 Hypertrophy of breast: Secondary | ICD-10-CM

## 2023-05-19 NOTE — Progress Notes (Signed)
   Subjective:    Patient ID: Erica Hull, female    DOB: 18-Aug-1965, 57 y.o.   MRN: [id number]  The patient is a 57 year old female here for follow-up after undergoing bilateral breast reduction October 30.  She had over 600 g removed from both breasts.  She does not have any sign of seroma, hematoma or infection.  I took the lower bandage off today.  Overall she is doing really well and the nipple areolas have good color and capillary refill.  Her bowels are moving.  Her pain is extremely well-controlled and she has not had to take any of the OxyContin.    Review of Systems  Constitutional: Negative.   Eyes: Negative.   Respiratory: Negative.    Cardiovascular: Negative.   Gastrointestinal: Negative.        Objective:   Physical Exam Constitutional:      Appearance: Normal appearance.  Cardiovascular:     Pulses: Normal pulses.  Musculoskeletal:        General: Swelling and tenderness present.  Skin:    General: Skin is warm.     Capillary Refill: Capillary refill takes less than 2 seconds.     Coloration: Skin is not jaundiced.     Findings: Bruising present.  Neurological:     Mental Status: She is oriented to person, place, and time.  Psychiatric:        Mood and Affect: Mood normal.        Behavior: Behavior normal.        Thought Content: Thought content normal.        Judgment: Judgment normal.         Assessment & Plan:   Continue with the sports bra.  Let the Steri-Strips fall off over time.  Follow-up in 2 to 3 weeks.  Pictures were obtained of the patient and placed in the chart with the patient's or guardian's permission.

## 2023-05-26 ENCOUNTER — Encounter: Payer: Self-pay | Admitting: Student

## 2023-05-26 NOTE — Progress Notes (Signed)
Patient called our office with concerns about blister to her left breast.  She states that she has some tenderness to the left breast and a blister to the lower part.  She also does report she has a very minimal amount of drainage from her left breast incisions.  Denies any purulent drainage.  She denies any significant swelling to the left breast in comparison to the right.  She denies any overlying redness.  Denies any fevers or chills.  Denies any warmth to palpation to her left breast.  She also has a little bit of irritation around the Steri-Strips.  Recommended to the patient that apply small amount of Vaseline to the blister daily and continue to monitor it.  Also recommended that she can apply Vaseline to the sites of irritation around the Steri-Strips.  I also told her that if she is comfortable, she may remove the Steri-Strips, but she did not feel comfortable removing the Steri-Strips.  Also recommended that she can take some antihistamine such as Claritin, Zyrtec or Allegra during the day and Benadryl at night.  Patient expressed understanding.  Discussed with patient to call us if she has any questions or concerns about anything.  Discussed with her that if she has any worsening symptoms such as swelling, redness, increased pain, fevers, chills to call the on-call service provider.  Otherwise we will see the patient on Monday.  Patient expressed understanding.

## 2023-05-27 ENCOUNTER — Other Ambulatory Visit (HOSPITAL_BASED_OUTPATIENT_CLINIC_OR_DEPARTMENT_OTHER): Payer: Self-pay

## 2023-05-29 ENCOUNTER — Other Ambulatory Visit: Payer: Self-pay

## 2023-05-29 ENCOUNTER — Encounter: Payer: Self-pay | Admitting: Surgical

## 2023-05-29 ENCOUNTER — Other Ambulatory Visit (HOSPITAL_BASED_OUTPATIENT_CLINIC_OR_DEPARTMENT_OTHER): Payer: Self-pay

## 2023-05-29 ENCOUNTER — Encounter: Payer: 59 | Admitting: Student

## 2023-05-29 ENCOUNTER — Ambulatory Visit (INDEPENDENT_AMBULATORY_CARE_PROVIDER_SITE_OTHER): Payer: 59 | Admitting: Surgical

## 2023-05-29 VITALS — BP 129/85 | HR 74

## 2023-05-29 DIAGNOSIS — N62 Hypertrophy of breast: Secondary | ICD-10-CM

## 2023-05-29 DIAGNOSIS — G8929 Other chronic pain: Secondary | ICD-10-CM

## 2023-05-29 DIAGNOSIS — M546 Pain in thoracic spine: Secondary | ICD-10-CM

## 2023-05-29 DIAGNOSIS — Z9889 Other specified postprocedural states: Secondary | ICD-10-CM

## 2023-05-29 MED ORDER — CELECOXIB 200 MG PO CAPS
200.0000 mg | ORAL_CAPSULE | Freq: Every day | ORAL | 1 refills | Status: DC
  Filled 2023-05-29: qty 30, 30d supply, fill #0
  Filled 2023-07-11: qty 30, 30d supply, fill #1

## 2023-05-29 NOTE — Progress Notes (Signed)
Patient is a  57 y.o.-year-old female status post bilateral breast reduction with Dr.  Ulice Bold. Patient is 2.5 weeks postop.  Patient reports overall she is doing really well, she does report that she has had some blisters on the left breast from the Steri-Strips.  She has been applying Vaseline to these areas.  She has not noticed any worsening blistering, or any additional ones.  She is not having any infectious symptoms after surgery, she is overall doing really well.  Chaperone present on exam Bilateral NAC's are viable, bilateral breast incisions are intact. There is no erythema or cellulitic changes noted. No subcutaneous fluid collections noted with palpation.  Small blisters noted on left lateral breast just adjacent to the vertical limb.  There is no surrounding erythema or cellulitic changes noted.  Bilateral breasts are symmetric.  A/P: Recommend continuing with compressive garment 24/7 until 6 weeks post-op,  avoiding strenuous activity/heavy lifting until 6 weeks post-op  Patient has an appointment for follow-up in a few days, discussed with her that if she is doing well she can cancel this appointment.  We will schedule her to follow-up in 2 weeks for reevaluation.  There is no signs of infection or concern on exam.  Recommend continuing with Vaseline to the left breast blistering  All the patient's questions were answered to their content. Recommend calling with any questions or concerns.

## 2023-06-01 ENCOUNTER — Ambulatory Visit: Payer: 59 | Admitting: Student

## 2023-06-01 ENCOUNTER — Encounter: Payer: Self-pay | Admitting: Student

## 2023-06-01 VITALS — BP 120/83 | HR 90

## 2023-06-01 DIAGNOSIS — N62 Hypertrophy of breast: Secondary | ICD-10-CM

## 2023-06-01 NOTE — Progress Notes (Signed)
Patient is a 57 year old female who underwent bilateral breast reduction with Dr. Ulice Bold on 05/10/2023.  She is 3 weeks postop.  She presents to the clinic today for postoperative follow-up.  Patient was last seen in the clinic on 05/29/2023.  At this visit, patient was doing well.  She reported that she had some blisters on the left breast from the Steri-Strips.  She had been applying Vaseline to the areas.  She had no infectious symptoms.  On exam, bilateral NAC's were viable, bilateral breast incisions were intact.  There is no cellulitic changes noted, no fluid collections palpated on exam.  There were small blisters noted on the left lateral breast just adjacent to the vertical limb.  No surrounding erythema or cellulitic changes.  Today, patient reports she is doing well.  She states that she still has a little bit of numbness and pain on the sides, but overall it is controlled with Tylenol.  She reports the blisters appear to be resolving.  Denies any other issues or concerns at this time.  Denies any fevers or chills.  Chaperone present on exam.  On exam, patient is sitting upright in no acute distress.  Breasts are soft and symmetric.  There is no overlying erythema.  No fluid collections palpated on exam.  There is a little bit of firmness noted to the inferior right breast and medial left breast, consistent with some scar tissue.  No overlying skin changes.  NAC's are healthy bilaterally.  Incisions are intact and healing well.  There is some residual Dermabond noted to the incisions bilaterally.  To the left breast, there are some superficial wounds from where the blisters appeared to be before.  No signs of infection on exam.  Several suture knots were cut and removed at today's visit.  Discussed with patient to continue applying Vaseline to her incisions daily.  Discussed with her that this should help get some of the residual Dermabond off.  Also recommended she continue to apply Vaseline  to the wounds where the blisters were.  Patient expressed understanding.  Instructed patient to continue to avoid vigorous activities and continue wearing her compression at all times.  Patient to follow back up in 2 weeks.  Instructed her to call in the meantime she is any questions or concerns about anything.

## 2023-06-12 ENCOUNTER — Encounter: Payer: 59 | Admitting: Student

## 2023-06-14 ENCOUNTER — Ambulatory Visit: Payer: 59 | Admitting: Student

## 2023-06-14 ENCOUNTER — Encounter: Payer: Self-pay | Admitting: Student

## 2023-06-14 VITALS — BP 125/86 | HR 67 | Ht 67.0 in | Wt 195.0 lb

## 2023-06-14 DIAGNOSIS — N62 Hypertrophy of breast: Secondary | ICD-10-CM

## 2023-06-14 NOTE — Progress Notes (Signed)
Patient is a 57 year old female who underwent bilateral breast reduction with Dr. Ulice Bold on 05/10/2023.  She is 5 weeks postop.  She presents to the clinic in today for postoperative follow-up.  Patient was most recently seen in the clinic on 06/01/2023.  At this visit, patient was doing well.  On exam, breasts were soft and symmetric.  Incisions were overall intact and healing well.  There were some superficial wounds from where blisters were present to the left breast.  Today, patient reports she is doing well.  She states that she still sometimes has some pain to her left lateral axillary area and sometimes has a sharp intermittent tingling sensation to her breast.  Otherwise denies any other issues or concerns.  States that she is happy with her outcome.  Chaperone present on exam.  On exam, patient is sitting upright in no acute distress.  Breasts are soft and symmetric.  There is no overlying erythema.  No fluid collections palpated on exam.  NAC's are healthy bilaterally.  Incisions appear to be overall intact and healing well.  There is still little bit of residual Dermabond.  There is some small superficial scabbing noted to the inferior T-zone of the right breast.  No signs of infection on exam.  Discussed with patient that I would like her to continue to apply Vaseline to her incision daily for the next week or so, and then she may transition to scar creams.  Discussed with her that she should wear compression for another week and then she may transition to a regular bra with no underwire.  Discussed with her she may start gradually increasing her activities next week.  Patient expressed understanding.  Discussed with her that her pain is most likely from the liposuction and some nerve pain as well.  Discussed with her that this should improve with time, but if she continues to have issues over the next several months, to let us know.  Patient expressed understanding.  Patient to follow-up  as needed.  Instructed her to call us if she has any questions or concerns  Pictures were obtained of the patient and placed in the chart with the patient's or guardian's permission.

## 2023-06-22 ENCOUNTER — Other Ambulatory Visit: Payer: Self-pay

## 2023-07-07 ENCOUNTER — Other Ambulatory Visit (HOSPITAL_BASED_OUTPATIENT_CLINIC_OR_DEPARTMENT_OTHER): Payer: Self-pay

## 2023-07-10 ENCOUNTER — Other Ambulatory Visit (HOSPITAL_BASED_OUTPATIENT_CLINIC_OR_DEPARTMENT_OTHER): Payer: Self-pay

## 2023-08-28 ENCOUNTER — Other Ambulatory Visit: Payer: Self-pay | Admitting: Nurse Practitioner

## 2023-08-28 DIAGNOSIS — E038 Other specified hypothyroidism: Secondary | ICD-10-CM

## 2023-08-29 ENCOUNTER — Other Ambulatory Visit: Payer: Self-pay

## 2023-08-29 DIAGNOSIS — E038 Other specified hypothyroidism: Secondary | ICD-10-CM

## 2023-08-29 MED ORDER — LEVOTHYROXINE SODIUM 88 MCG PO TABS: ORAL_TABLET | ORAL | 0 refills | Status: DC

## 2023-08-30 ENCOUNTER — Other Ambulatory Visit (HOSPITAL_BASED_OUTPATIENT_CLINIC_OR_DEPARTMENT_OTHER): Payer: Self-pay | Admitting: Nurse Practitioner

## 2023-08-30 DIAGNOSIS — E782 Mixed hyperlipidemia: Secondary | ICD-10-CM

## 2023-08-30 DIAGNOSIS — Z6836 Body mass index (BMI) 36.0-36.9, adult: Secondary | ICD-10-CM

## 2023-08-31 ENCOUNTER — Other Ambulatory Visit (HOSPITAL_BASED_OUTPATIENT_CLINIC_OR_DEPARTMENT_OTHER): Payer: Self-pay

## 2023-08-31 ENCOUNTER — Other Ambulatory Visit: Payer: Self-pay

## 2023-08-31 MED ORDER — WEGOVY 2.4 MG/0.75ML ~~LOC~~ SOAJ
2.4000 mg | SUBCUTANEOUS | 0 refills | Status: DC
  Filled 2023-08-31: qty 3, 28d supply, fill #0
  Filled 2023-09-28: qty 3, 28d supply, fill #1
  Filled 2023-10-31: qty 3, 28d supply, fill #2

## 2023-11-01 ENCOUNTER — Other Ambulatory Visit: Payer: Self-pay | Admitting: Nurse Practitioner

## 2023-11-01 DIAGNOSIS — E038 Other specified hypothyroidism: Secondary | ICD-10-CM

## 2023-11-21 ENCOUNTER — Ambulatory Visit (INDEPENDENT_AMBULATORY_CARE_PROVIDER_SITE_OTHER): Admitting: Obstetrics and Gynecology

## 2023-11-21 ENCOUNTER — Encounter: Payer: Self-pay | Admitting: Obstetrics and Gynecology

## 2023-11-21 VITALS — BP 110/76 | HR 80 | Temp 98.3°F | Ht 66.25 in | Wt 193.6 lb

## 2023-11-21 DIAGNOSIS — Z1331 Encounter for screening for depression: Secondary | ICD-10-CM

## 2023-11-21 DIAGNOSIS — Z01419 Encounter for gynecological examination (general) (routine) without abnormal findings: Secondary | ICD-10-CM | POA: Insufficient documentation

## 2023-11-21 DIAGNOSIS — Z0000 Encounter for general adult medical examination without abnormal findings: Secondary | ICD-10-CM | POA: Insufficient documentation

## 2023-11-21 NOTE — Assessment & Plan Note (Signed)
 Cervical cancer screening performed according to ASCCP guidelines. Encouraged annual mammogram screening Colonoscopy UTD DXA N/A Labs and immunizations with her primary Encouraged safe sexual practices as indicated Encouraged healthy lifestyle practices with diet and exercise For patients under 50-58yo, I recommend 1200mg  calcium daily and 600IU of vitamin D daily.

## 2023-11-21 NOTE — Progress Notes (Signed)
 58 y.o. [id number] female s/p BTL with AUB, fibroids, prior endometrial ablation, mammoplasty 2024 here for annual exam. Married.  No concerns today.  Occasional hot flashes, managing with Estroven. 2024 w/u for PMB with normal TVUS. No bleeding since. Had mammoplasty last fall  Abnormal bleeding: none Pelvic discharge or pain: none Breast mass, nipple discharge or skin changes : None  Last PAP:     Component Value Date/Time   DIAGPAP  11/11/2022 0952    - Negative for intraepithelial lesion or malignancy (NILM)   ADEQPAP  11/11/2022 0952    Satisfactory for evaluation; transformation zone component PRESENT.   Last mammogram: 04/11/23 BIRADS 1, density b Last colonoscopy: 12/21/22, q59yr Sexually active: yes  Exercising: walking Smoker: no  Flowsheet Row Office Visit from 11/21/2023 in [facility]  PHQ-2 Total Score 0       Flowsheet Row Office Visit from 01/25/2022 in [facility]  PHQ-9 Total Score 0       GYN HISTORY: AS noted above  OB History  Gravida Para Term Preterm AB Living  4 2 2  2 2   SAB IAB Ectopic Multiple Live Births      2    # Outcome Date GA Lbr Len/2nd Weight Sex Type Anes PTL Lv  4 AB           3 AB           2 Term     F CS-Unspec  N LIV  1 Term     M CS-Unspec  N LIV    Past Medical History:  Diagnosis Date   Anemia    Elective abortion    X2   Enlarged aorta (HCC)    H/O peripheral vascular disease 2006   Heart murmur    Hx of cystitis 2006   Hypothyroidism    Missed ab     Past Surgical History:  Procedure Laterality Date   BREAST REDUCTION SURGERY Bilateral 05/10/2023   Procedure: BREAST REDUCTION WITH LIPOSUCTION;  Surgeon: Thornell Flirt, DO;  Location: [facility];  Service: Plastics;  Laterality: Bilateral;   CESAREAN SECTION     CESAREAN SECTION W/BTL     COLONOSCOPY     DILATION AND CURETTAGE OF UTERUS      ENDOMETRIAL ABLATION     HTA   ENDOSCOPIC VEIN LASER TREATMENT     UPPER GASTROINTESTINAL ENDOSCOPY     vein closure procedure Left 07/11/2004    Current Outpatient Medications on File Prior to Visit  Medication Sig Dispense Refill   B Complex-C-Folic Acid (SUPER B COMPLEX/FA/VIT C PO) Take 1 tablet by mouth daily.     Cholecalciferol (D3-1000) 25 MCG (1000 UT) tablet      Cyanocobalamin (VITAMIN B 12 PO) Take 1,000 mcg by mouth daily.     Ginkgo Biloba 120 MG CAPS Take 1 tablet by mouth daily.     levothyroxine  (SYNTHROID ) 88 MCG tablet TAKE 1 TABLET BY MOUTH 5 DAYS  PER WEEK AND 1 AND 1/2 TABLETS  BY MOUTH 2 DAYS WEEKLY 100 tablet 3   metoprolol  succinate (TOPROL -XL) 25 MG 24 hr tablet TAKE 1 TABLET BY MOUTH ONCE  DAILY 90 tablet 3   omeprazole (PRILOSEC) 20 MG capsule Take 20 mg by mouth daily.     ondansetron  (ZOFRAN ) 8 MG tablet Take 1 tablet (8 mg total) by mouth every 8 (eight) hours as needed for nausea or  vomiting. 20 tablet 3   Rhubarb (ESTROVEN COMPLETE PO) Take 1 capsule by mouth daily.     Semaglutide -Weight Management (WEGOVY ) 2.4 MG/0.75ML SOAJ Inject 2.4 mg into the skin once a week. 9 mL 0   TURMERIC CURCUMIN PO Take 2,250 mg by mouth daily.     vitamin E 400 UNIT capsule Take 400 Units by mouth daily.     No current facility-administered medications on file prior to visit.    Social History   Socioeconomic History   Marital status: Married    Spouse name: Not on file   Number of children: 2   Years of education: Not on file   Highest education level: Not on file  Occupational History   Not on file  Tobacco Use   Smoking status: Never   Smokeless tobacco: Never  Vaping Use   Vaping status: Never Used  Substance and Sexual Activity   Alcohol use: Not Currently   Drug use: No   Sexual activity: Yes    Partners: Male    Birth control/protection: Surgical    Comment: 1ST Intercourse- 20, partners- 3, btl  Other Topics Concern   Not on file  Social History  Narrative   Not on file   Social Drivers of Health   Financial Resource Strain: Not on file  Food Insecurity: Not on file  Transportation Needs: Not on file  Physical Activity: Not on file  Stress: Not on file  Social Connections: Not on file  Intimate Partner Violence: Not on file    Family History  Problem Relation Age of Onset   Rheum arthritis Mother    COPD Mother    Dementia Mother    Hypertension Father    Cancer Father 16       ureter   Atrial fibrillation Father    Colon polyps Father    Colon cancer Neg Hx    Stomach cancer Neg Hx    Esophageal cancer Neg Hx    Rectal cancer Neg Hx    Inflammatory bowel disease Neg Hx    Liver disease Neg Hx    Pancreatic cancer Neg Hx     No Known Allergies    PE Today's Vitals   11/21/23 1139  BP: 110/76  Pulse: 80  Temp: 98.3 F (36.8 C)  TempSrc: Oral  SpO2: 98%  Weight: 193 lb 9.6 oz (87.8 kg)  Height: 5' 6.25" (1.683 m)   Body mass index is 31.01 kg/m.  Physical Exam Vitals reviewed. Exam conducted with a chaperone present.  Constitutional:      General: She is not in acute distress.    Appearance: Normal appearance.  HENT:     Head: Normocephalic and atraumatic.     Nose: Nose normal.  Eyes:     Extraocular Movements: Extraocular movements intact.     Conjunctiva/sclera: Conjunctivae normal.  Neck:     Thyroid: No thyroid mass, thyromegaly or thyroid tenderness.  Pulmonary:     Effort: Pulmonary effort is normal.  Chest:     Chest wall: No mass or tenderness.  Breasts:    Right: Normal. No swelling, mass, nipple discharge, skin change or tenderness.     Left: Normal. No swelling, mass, nipple discharge, skin change or tenderness.  Abdominal:     General: There is no distension.     Palpations: Abdomen is soft.     Tenderness: There is no abdominal tenderness.  Genitourinary:    General: Normal vulva.     Exam  position: Lithotomy position.     Urethra: No prolapse.     Vagina: Normal. No  vaginal discharge or bleeding.     Cervix: Normal. No lesion.     Uterus: Normal. Not enlarged and not tender.      Adnexa: Right adnexa normal and left adnexa normal.  Musculoskeletal:        General: Normal range of motion.     Cervical back: Normal range of motion.  Lymphadenopathy:     Upper Body:     Right upper body: No axillary adenopathy.     Left upper body: No axillary adenopathy.     Lower Body: No right inguinal adenopathy. No left inguinal adenopathy.  Skin:    General: Skin is warm and dry.  Neurological:     General: No focal deficit present.     Mental Status: She is alert.  Psychiatric:        Mood and Affect: Mood normal.        Behavior: Behavior normal.      Assessment and Plan:        Well woman exam with routine gynecological exam Assessment & Plan: Cervical cancer screening performed according to ASCCP guidelines. Encouraged annual mammogram screening Colonoscopy UTD DXA N/A Labs and immunizations with her primary Encouraged safe sexual practices as indicated Encouraged healthy lifestyle practices with diet and exercise For patients under 50-70yo, I recommend 1200mg  calcium daily and 600IU of vitamin D  daily.    Negative depression screening   Romaine Closs, MD

## 2023-11-21 NOTE — Patient Instructions (Signed)

## 2023-12-05 ENCOUNTER — Other Ambulatory Visit (HOSPITAL_BASED_OUTPATIENT_CLINIC_OR_DEPARTMENT_OTHER): Payer: Self-pay | Admitting: Nurse Practitioner

## 2023-12-05 DIAGNOSIS — E782 Mixed hyperlipidemia: Secondary | ICD-10-CM

## 2023-12-05 DIAGNOSIS — Z6836 Body mass index (BMI) 36.0-36.9, adult: Secondary | ICD-10-CM

## 2023-12-06 ENCOUNTER — Other Ambulatory Visit (HOSPITAL_BASED_OUTPATIENT_CLINIC_OR_DEPARTMENT_OTHER): Payer: Self-pay

## 2023-12-06 MED ORDER — WEGOVY 2.4 MG/0.75ML ~~LOC~~ SOAJ
2.4000 mg | SUBCUTANEOUS | 0 refills | Status: DC
  Filled 2023-12-06: qty 3, 28d supply, fill #0

## 2024-01-02 ENCOUNTER — Ambulatory Visit: Admitting: Nurse Practitioner

## 2024-01-02 ENCOUNTER — Other Ambulatory Visit (HOSPITAL_BASED_OUTPATIENT_CLINIC_OR_DEPARTMENT_OTHER): Payer: Self-pay

## 2024-01-02 ENCOUNTER — Encounter: Payer: Self-pay | Admitting: Nurse Practitioner

## 2024-01-02 VITALS — BP 128/74 | HR 70 | Ht 66.5 in | Wt 197.4 lb

## 2024-01-02 DIAGNOSIS — M47816 Spondylosis without myelopathy or radiculopathy, lumbar region: Secondary | ICD-10-CM

## 2024-01-02 DIAGNOSIS — E039 Hypothyroidism, unspecified: Secondary | ICD-10-CM | POA: Diagnosis not present

## 2024-01-02 DIAGNOSIS — I998 Other disorder of circulatory system: Secondary | ICD-10-CM

## 2024-01-02 DIAGNOSIS — K219 Gastro-esophageal reflux disease without esophagitis: Secondary | ICD-10-CM

## 2024-01-02 DIAGNOSIS — M533 Sacrococcygeal disorders, not elsewhere classified: Secondary | ICD-10-CM

## 2024-01-02 DIAGNOSIS — Z0000 Encounter for general adult medical examination without abnormal findings: Secondary | ICD-10-CM | POA: Diagnosis not present

## 2024-01-02 DIAGNOSIS — R002 Palpitations: Secondary | ICD-10-CM

## 2024-01-02 DIAGNOSIS — Z23 Encounter for immunization: Secondary | ICD-10-CM | POA: Diagnosis not present

## 2024-01-02 DIAGNOSIS — Z6836 Body mass index (BMI) 36.0-36.9, adult: Secondary | ICD-10-CM | POA: Diagnosis not present

## 2024-01-02 DIAGNOSIS — G8929 Other chronic pain: Secondary | ICD-10-CM

## 2024-01-02 DIAGNOSIS — E782 Mixed hyperlipidemia: Secondary | ICD-10-CM

## 2024-01-02 MED ORDER — WEGOVY 2.4 MG/0.75ML ~~LOC~~ SOAJ
2.4000 mg | SUBCUTANEOUS | 3 refills | Status: AC
  Filled 2024-01-02: qty 3, 28d supply, fill #0
  Filled 2024-02-01: qty 3, 28d supply, fill #1
  Filled 2024-02-29: qty 3, 28d supply, fill #2
  Filled 2024-05-13: qty 3, 28d supply, fill #3
  Filled 2024-06-19: qty 3, 28d supply, fill #4

## 2024-01-02 NOTE — Assessment & Plan Note (Signed)

## 2024-01-02 NOTE — Assessment & Plan Note (Signed)
 Chronic back pain persists despite previous radiofrequency treatment, which was ineffective. Physical therapy and dry needling provided the best relief in the past. Pain is not debilitating but is a constant annoyance. She plans to resume physical therapy if pain worsens and uses Aleve for symptomatic relief as needed. - Consider resuming physical therapy if pain worsens - Use Aleve for symptomatic relief as needed

## 2024-01-02 NOTE — Assessment & Plan Note (Signed)
 Managed with metoprolol . No concerns at this time.

## 2024-01-02 NOTE — Assessment & Plan Note (Signed)
 Managed with daily omeprazole. Previously had H. pylori infection, which was treated. Reports significant improvement in symptoms post-treatment. Continues to take omeprazole daily as advised by her gastroenterologist. - Continue daily omeprazole

## 2024-01-02 NOTE — Progress Notes (Signed)
 [name], DNP, AGNP-c [facility] [address] [place], [place] [address] Main Office [phone]  BP 128/74   Pulse 70   Ht 5' 6.5 (1.689 m)   Wt 197 lb 6.4 oz (89.5 kg)   LMP 04/09/2021 (Exact Date) Comment: sexually active,btl  BMI 31.38 kg/m    Subjective:    Patient ID: Erica Hull, female    DOB: [birth year], 58 y.o.   MRN: [id number]  HPI: History of Present Illness Erica Hull is a 58 year old female who presents for a routine follow-up visit.  She manages her hypothyroidism with levothyroxine , taking one tablet five days a week and one and a half tablets two days a week. She experiences fatigue, particularly a 'three o'clock slump' in the afternoon, but notes it is not as severe as when she had anemia. She describes herself as 'immune to caffeine' and sometimes feels unsafe driving due to tiredness.  She is currently using Wegovy  for weight management but notes that its appetite suppressant effect seems to have plateaued, as she has not lost weight in the past year despite being on the highest dose. She has increased her physical activity by purchasing a walking pad. She has lost forty pounds and feels better, noting improved ease in activities such as climbing stairs.  She has a history of anemia and underwent treatment for H. pylori, which she believes improved her gastrointestinal symptoms. She takes omeprazole daily for GERD and has a small hiatal hernia. She experienced significant discomfort when she had to stop omeprazole for two weeks during H. pylori treatment.  She reports chronic back pain, which persists despite previous radiofrequency treatment. Physical therapy and dry needling provided the most relief, but she finds it difficult to maintain regular sessions. She manages the pain with stretching exercises and occasional Aleve.  She has a history of varicose veins and underwent ablation on her legs. She uses compression socks to  manage symptoms of poor circulation, which she describes as hereditary, noting her mother also had similar issues.  No recent changes in bowel or bladder habits, shortness of breath, or significant palpitations. She has not experienced any menstrual periods for over a year following a normal gynecological workup after a single abnormal period.    Pertinent items are noted in HPI.   Most Recent Depression Screen:     01/02/2024    9:49 AM 11/21/2023   11:33 AM 01/25/2022    9:03 AM 10/29/2021   10:09 AM  Depression screen PHQ 2/9  Decreased Interest 0 0 0 0  Down, Depressed, Hopeless 0 0 0 0  PHQ - 2 Score 0 0 0 0  Altered sleeping   0   Tired, decreased energy   0   Change in appetite   0   Feeling bad or failure about yourself    0   Trouble concentrating   0   Moving slowly or fidgety/restless   0   Suicidal thoughts   0   PHQ-9 Score   0   Difficult doing work/chores   Not difficult at all    Most Recent Anxiety Screen:      No data to display         Most Recent Fall Screen:    01/02/2024    9:49 AM 11/21/2023   11:32 AM 11/11/2022    9:06 AM 10/21/2022    1:25 PM 09/19/2022    9:22 AM  Fall Risk   Falls in the past  year? 0 0 0 0 0  Number falls in past yr: 0 0 0 0 0  Injury with Fall? 0 0 0 0 0  Risk for fall due to : No Fall Risks No Fall Risks No Fall Risks No Fall Risks No Fall Risks  Follow up Falls evaluation completed Falls evaluation completed Falls evaluation completed Falls evaluation completed Falls evaluation completed    Past medical history, surgical history, medications, allergies, family history and social history reviewed with patient today and changes made to appropriate areas of the chart.  Past Medical History:  Past Medical History:  Diagnosis Date   Anemia    Constipation 04/28/2023   DUB (dysfunctional uterine bleeding) 01/29/2014   Elective abortion    X2   Enlarged aorta (HCC)    Fibroids, intramural 05/31/2011   H/O peripheral  vascular disease 2006   Heart murmur    History of Helicobacter pylori infection 04/28/2023   Hx of cystitis 2006   Hypothyroidism    Iron  deficiency 09/19/2022   Loss of weight 04/28/2023   Missed ab    Medications:  Current Outpatient Medications on File Prior to Visit  Medication Sig   B Complex-C-Folic Acid (SUPER B COMPLEX/FA/VIT C PO) Take 1 tablet by mouth daily.   Cholecalciferol (D3-1000) 25 MCG (1000 UT) tablet    Cyanocobalamin (VITAMIN B 12 PO) Take 1,000 mcg by mouth daily.   Ginkgo Biloba 120 MG CAPS Take 1 tablet by mouth daily.   levothyroxine  (SYNTHROID ) 88 MCG tablet TAKE 1 TABLET BY MOUTH 5 DAYS  PER WEEK AND 1 AND 1/2 TABLETS  BY MOUTH 2 DAYS WEEKLY   metoprolol  succinate (TOPROL -XL) 25 MG 24 hr tablet TAKE 1 TABLET BY MOUTH ONCE  DAILY   omeprazole (PRILOSEC) 20 MG capsule Take 20 mg by mouth daily.   ondansetron  (ZOFRAN ) 8 MG tablet Take 1 tablet (8 mg total) by mouth every 8 (eight) hours as needed for nausea or vomiting.   Rhubarb (ESTROVEN COMPLETE PO) Take 1 capsule by mouth daily.   TURMERIC CURCUMIN PO Take 2,250 mg by mouth daily.   vitamin E 400 UNIT capsule Take 400 Units by mouth daily.   No current facility-administered medications on file prior to visit.   Surgical History:  Past Surgical History:  Procedure Laterality Date   BREAST REDUCTION SURGERY Bilateral 05/10/2023   Procedure: BREAST REDUCTION WITH LIPOSUCTION;  Surgeon: Lowery Estefana RAMAN, DO;  Location:  [facility];  Service: Plastics;  Laterality: Bilateral;   CESAREAN SECTION     CESAREAN SECTION W/BTL     COLONOSCOPY     DILATION AND CURETTAGE OF UTERUS     ENDOMETRIAL ABLATION     HTA   ENDOSCOPIC VEIN LASER TREATMENT     UPPER GASTROINTESTINAL ENDOSCOPY     vein closure procedure Left 07/11/2004   Allergies:  No Known Allergies Family History:  Family History  Problem Relation Age of Onset   Rheum arthritis Mother    COPD Mother    Dementia Mother     Hypertension Father    Cancer Father 2       ureter   Atrial fibrillation Father    Colon polyps Father    Colon cancer Neg Hx    Stomach cancer Neg Hx    Esophageal cancer Neg Hx    Rectal cancer Neg Hx    Inflammatory bowel disease Neg Hx    Liver disease Neg Hx    Pancreatic cancer Neg Hx  Objective:    BP 128/74   Pulse 70   Ht 5' 6.5 (1.689 m)   Wt 197 lb 6.4 oz (89.5 kg)   LMP 04/09/2021 (Exact Date) Comment: sexually active,btl  BMI 31.38 kg/m   Wt Readings from Last 3 Encounters:  01/02/24 197 lb 6.4 oz (89.5 kg)  11/21/23 193 lb 9.6 oz (87.8 kg)  06/14/23 195 lb (88.5 kg)    Physical Exam Constitutional:      General: She is not in acute distress.    Appearance: Normal appearance.  HENT:     Head: Normocephalic.     Right Ear: Tympanic membrane normal.     Left Ear: Tympanic membrane normal.     Nose: Nose normal.     Mouth/Throat:     Mouth: Mucous membranes are moist.     Pharynx: Oropharynx is clear.   Eyes:     Conjunctiva/sclera: Conjunctivae normal.     Pupils: Pupils are equal, round, and reactive to light.   Neck:     Vascular: No carotid bruit.   Cardiovascular:     Rate and Rhythm: Normal rate and regular rhythm.     Pulses: Normal pulses.     Heart sounds: Normal heart sounds.  Pulmonary:     Effort: Pulmonary effort is normal.     Breath sounds: Normal breath sounds.  Abdominal:     General: Abdomen is flat. There is no distension.     Palpations: Abdomen is soft. There is no mass.     Tenderness: There is no abdominal tenderness. There is no right CVA tenderness, left CVA tenderness or guarding.   Musculoskeletal:        General: Swelling present.     Cervical back: Normal range of motion. No tenderness.     Comments: Left leg vascular insufficiency worse than right with history of ablation and sclerotherapy.   Lymphadenopathy:     Cervical: No cervical adenopathy.   Skin:    General: Skin is warm and dry.      Capillary Refill: Capillary refill takes less than 2 seconds.   Neurological:     General: No focal deficit present.     Mental Status: She is alert and oriented to person, place, and time.     Sensory: No sensory deficit.     Motor: No weakness.     Gait: Gait normal.   Psychiatric:        Mood and Affect: Mood normal.        Behavior: Behavior normal.      Results for orders placed or performed during the hospital encounter of 05/10/23  Surgical pathology   Collection Time: 05/10/23 10:15 AM  Result Value Ref Range   SURGICAL PATHOLOGY      SURGICAL PATHOLOGY CASE: [id number] PATIENT: JON DYKES Surgical Pathology Report     Clinical History: macromastia (cm)     FINAL MICROSCOPIC DIAGNOSIS:  A. BREAST, LEFT, MAMMOPLASTY: - Mild fibrocystic change - Negative for carcinoma  B. BREAST, RIGHT, MAMMOPLASTY: - Mild fibrocystic change - Negative for carcinoma      GROSS DESCRIPTION:  A.  Left breast tissue and skin, received fresh and placed in formalin Weight: 546 g (fresh) Size in aggregate: 19.5 x 16.8 x 4.3 cm Skin: White-tan and smooth with a 0.3 x 0.3 flat, ill-defined area of pink-brown discoloration.  Additional lesions and scars are not identified. Cut Surface: 10% mildly edematous, white-pink fibrous tissue; 90% soft, lobulated adipose tissue.  Discrete masses  and lesions are not identified. Block Summary: A1: Skin lesion (representative); fibrous tissue (representative) A2: Fibrous tissue (representative)   B.  Right breast tissue and skin, received fresh and place d in formalin Weight: 574 g (fresh) Size in aggregate: 17.5 x 17.5 x 4.2 cm Skin: White-tan and mildly wrinkled without discrete scars or lesions. Cut Surface: 10% thin, white-pink fibrous tissue; 90% soft, lobulated adipose tissue.  Discrete masses and lesions are not identified. Block Summary: B1-2: Representative sections  AMG 10.31.2024  Final Diagnosis  performed by Katrine Muskrat, MD.   Electronically signed 05/12/2023 Technical component performed at [facility]. [facility], [address] [place], [place] [address].  Professional component performed at [facility], [address] [place], [place] [address].  Immunohistochemistry Technical component (if applicable) was performed at [facility]. [address], [place], [place] [address].   IMMUNOHISTOCHEMISTRY DISCLAIMER (if applicable): Some of these immunohistochemical stains may have been developed and the performance characteristics determine by [facility]. Some may not have been cleared or approved by the U.S. Food and Drug Administration. The FDA has determined that such clearance or approval is not necessary. This test is used for clinical purposes. It should not be regarded as investigational or for research. This laboratory is certified under the Clinical Laboratory Improvement Amendments of 1988 (CLIA-88) as qualified to perform high complexity clinical laboratory testing.  The controls stained appropriately.   IHC stains are performed on formalin fixed, paraffin embedded tissue using a 3,3diaminobenzidine (DAB) chromogen and Leica Bond Autostainer System. The staining intensity of the nucleus is score manually and is reported as the percentage of tumor cell nuclei demonstrating specific nuclear staining. The specimens are fixed in 10% Neutral Formalin for at least 6 hours and up to 72hrs. These tests are validated on decalcified tissue. Results should be interpreted with caution given the possi bility of false negative results on decalcified specimens. Antibody Clones are as follows ER-clone 20F, PR-clone 16, Ki67- clone MM1. Some of these immunohistochemical stains may have been developed and the performance characteristics determined by [facility].        Assessment & Plan:   Problem  List Items Addressed This Visit     Hypothyroidism   Managed with levothyroxine , one tablet five days a week and one and a half tablets two days a week. She reports persistent fatigue, particularly in the afternoon, but it is less severe than when she had anemia. The current dosage effectively manages symptoms. - Continue current levothyroxine  regimen       Relevant Orders   CBC with Differential/Platelet   TSH   Vascular insufficiency of extremity   Venous insufficiency with previous procedures on both legs. Recently experienced pain and throbbing in the leg, leading to further treatment. Compression socks provide symptomatic relief. Hereditary factors contribute to her condition. - Continue use of compression socks for symptomatic relief      BMI 36.0-36.9,adult   She is on Wegovy  for weight management. BMI down to 31. The medication's appetite suppressant effect has plateaued, and she has not lost weight in the past year despite being on the highest dose. She is increasing physical activity to aid weight loss. No side effects noted upon resuming the medication after breaks. - Encourage increased physical activity to aid weight loss- she is doing GREAT!      Relevant Medications   Semaglutide -Weight Management (WEGOVY ) 2.4 MG/0.75ML SOAJ   Other Relevant Orders   Hemoglobin A1c  CBC with Differential/Platelet   Comprehensive metabolic panel with GFR   Lipid panel   Chronic right SI joint pain   Chronic back pain persists despite previous radiofrequency treatment, which was ineffective. Physical therapy and dry needling provided the best relief in the past. Pain is not debilitating but is a constant annoyance. She plans to resume physical therapy if pain worsens and uses Aleve for symptomatic relief as needed. - Consider resuming physical therapy if pain worsens - Use Aleve for symptomatic relief as needed      Moderate mixed hyperlipidemia not requiring statin therapy   Will  recheck today. Currently diet and exercise managed.       Relevant Medications   Semaglutide -Weight Management (WEGOVY ) 2.4 MG/0.75ML SOAJ   Other Relevant Orders   Hemoglobin A1c   CBC with Differential/Platelet   Comprehensive metabolic panel with GFR   Lipid panel   Intermittent palpitations   Managed with metoprolol . No concerns at this time.       Gastroesophageal reflux disease without esophagitis   Managed with daily omeprazole. Previously had H. pylori infection, which was treated. Reports significant improvement in symptoms post-treatment. Continues to take omeprazole daily as advised by her gastroenterologist. - Continue daily omeprazole      Encounter for annual physical exam - Primary   CPE completed today. Review of HM activities and recommendations discussed and provided on AVS. Anticipatory guidance, diet, and exercise recommendations provided. Medications, allergies, and hx reviewed and updated as necessary. Orders placed as listed below.  Plan: - Labs ordered. Will make changes as necessary based on results.  - I will review these results and send recommendations via MyChart or a telephone call.  - F/U with CPE in 1 year or sooner for acute/chronic health needs as directed.        Lumbar spondylosis   Chronic back pain persists despite previous radiofrequency treatment, which was ineffective. Physical therapy and dry needling provided the best relief in the past. Pain is not debilitating but is a constant annoyance. She plans to resume physical therapy if pain worsens and uses Aleve for symptomatic relief as needed. - Consider resuming physical therapy if pain worsens - Use Aleve for symptomatic relief as needed      Other Visit Diagnoses       Need for Tdap vaccination       Relevant Orders   Tdap vaccine greater than or equal to 7yo IM (Completed)     Need for shingles vaccine       Relevant Orders   Varicella-zoster vaccine IM (Completed)          Follow up plan: Return in about 1 year (around 01/01/2025) for CPE.  NEXT PREVENTATIVE PHYSICAL DUE IN 1 YEAR.  PATIENT COUNSELING PROVIDED FOR ALL ADULT PATIENTS: A well balanced diet low in saturated fats, cholesterol, and moderation in carbohydrates.  This can be as simple as monitoring portion sizes and cutting back on sugary beverages such as soda and juice to start with.    Daily water consumption of at least 64 ounces.  Physical activity at least 180 minutes per week.  If just starting out, start 10 minutes a day and work your way up.   This can be as simple as taking the stairs instead of the elevator and walking 2-3 laps around the office  purposefully every day.   STD protection, partner selection, and regular testing if high risk.  Limited consumption of alcoholic beverages if alcohol is consumed. For men,  I recommend no more than 14 alcoholic beverages per week, spread out throughout the week (max 2 per day). Avoid binge drinking or consuming large quantities of alcohol in one setting.  Please let me know if you feel you may need help with reduction or quitting alcohol consumption.   Avoidance of nicotine, if used. Please let me know if you feel you may need help with reduction or quitting nicotine use.   Daily mental health attention. This can be in the form of 5 minute daily meditation, prayer, journaling, yoga, reflection, etc.  Purposeful attention to your emotions and mental state can significantly improve your overall wellbeing  and  Health.  Please know that I am here to help you with all of your health care goals and am happy to work with you to find a solution that works best for you.  The greatest advice I have received with any changes in life are to take it one step at a time, that even means if all you can focus on is the next 60 seconds, then do that and celebrate your victories.  With any changes in life, you will have set backs, and that is OK. The  important thing to remember is, if you have a set back, it is not a failure, it is an opportunity to try again! Screening Testing Mammogram Every 1 -2 years based on history and risk factors Starting at age 81 Pap Smear Ages 21-39 every 3 years Ages 7-65 every 5 years with HPV testing More frequent testing may be required based on results and history Colon Cancer Screening Every 1-10 years based on test performed, risk factors, and history Starting at age 57 Bone Density Screening Every 2-10 years based on history Starting at age 74 for women Recommendations for men differ based on medication usage, history, and risk factors AAA Screening One time ultrasound Men 63-61 years old who have every smoked Lung Cancer Screening Low Dose Lung CT every 12 months Age 68-80 years with a 30 pack-year smoking history who still smoke or who have quit within the last 15 years   Screening Labs Routine  Labs: Complete Blood Count (CBC), Complete Metabolic Panel (CMP), Cholesterol (Lipid Panel) Every 6-12 months based on history and medications May be recommended more frequently based on current conditions or previous results Hemoglobin A1c Lab Every 3-12 months based on history and previous results Starting at age 82 or earlier with diagnosis of diabetes, high cholesterol, BMI >26, and/or risk factors Frequent monitoring for patients with diabetes to ensure blood sugar control Thyroid Panel (TSH) Every 6 months based on history, symptoms, and risk factors May be repeated more often if on medication HIV One time testing for all patients 22 and older May be repeated more frequently for patients with increased risk factors or exposure Hepatitis C One time testing for all patients 33 and older May be repeated more frequently for patients with increased risk factors or exposure Gonorrhea, Chlamydia Every 12 months for all sexually active persons 13-24 years Additional monitoring may be  recommended for those who are considered high risk or who have symptoms Every 12 months for any woman on birth control, regardless of sexual activity PSA Men 28-64 years old with risk factors Additional screening may be recommended from age 46-69 based on risk factors, symptoms, and history  Vaccine Recommendations Tetanus Booster All adults every 10 years Flu Vaccine All patients 6 months and older every year COVID Vaccine All patients 12 years and older  Initial dosing with booster May recommend additional booster based on age and health history HPV Vaccine 2 doses all patients age 52-26 Dosing may be considered for patients over 26 Shingles Vaccine (Shingrix) 2 doses all adults 55 years and older Pneumonia (Pneumovax 23) All adults 65 years and older May recommend earlier dosing based on health history One year apart from Prevnar 13 Pneumonia (Prevnar 4) All adults 65 years and older Dosed 1 year after Pneumovax 23 Pneumonia (Prevnar 20) One time alternative to the two dosing of 13 and 23 For all adults with initial dose of 23, 20 is recommended 1 year later For all adults with initial dose of 13, 23 is still recommended as second option 1 year later

## 2024-01-02 NOTE — Assessment & Plan Note (Signed)
 Managed with levothyroxine , one tablet five days a week and one and a half tablets two days a week. She reports persistent fatigue, particularly in the afternoon, but it is less severe than when she had anemia. The current dosage effectively manages symptoms. - Continue current levothyroxine  regimen

## 2024-01-02 NOTE — Assessment & Plan Note (Signed)
 Will recheck today. Currently diet and exercise managed.

## 2024-01-02 NOTE — Patient Instructions (Addendum)
 Keep up the great work with your weight! You are really doing amazing! Hopefully you see even more success with the walking pad.

## 2024-01-02 NOTE — Assessment & Plan Note (Signed)
 She is on Wegovy  for weight management. BMI down to 31. The medication's appetite suppressant effect has plateaued, and she has not lost weight in the past year despite being on the highest dose. She is increasing physical activity to aid weight loss. No side effects noted upon resuming the medication after breaks. - Encourage increased physical activity to aid weight loss- she is doing GREAT!

## 2024-01-02 NOTE — Assessment & Plan Note (Signed)
 Venous insufficiency with previous procedures on both legs. Recently experienced pain and throbbing in the leg, leading to further treatment. Compression socks provide symptomatic relief. Hereditary factors contribute to her condition. - Continue use of compression socks for symptomatic relief

## 2024-01-03 ENCOUNTER — Ambulatory Visit: Payer: Self-pay | Admitting: Nurse Practitioner

## 2024-01-03 LAB — COMPREHENSIVE METABOLIC PANEL WITH GFR
ALT: 13 IU/L (ref 0–32)
AST: 22 IU/L (ref 0–40)
Albumin: 4.4 g/dL (ref 3.8–4.9)
Alkaline Phosphatase: 86 IU/L (ref 44–121)
BUN/Creatinine Ratio: 20 (ref 9–23)
BUN: 15 mg/dL (ref 6–24)
Bilirubin Total: 0.5 mg/dL (ref 0.0–1.2)
CO2: 21 mmol/L (ref 20–29)
Calcium: 10.3 mg/dL — ABNORMAL HIGH (ref 8.7–10.2)
Chloride: 103 mmol/L (ref 96–106)
Creatinine, Ser: 0.74 mg/dL (ref 0.57–1.00)
Globulin, Total: 2.1 g/dL (ref 1.5–4.5)
Glucose: 83 mg/dL (ref 70–99)
Potassium: 4.4 mmol/L (ref 3.5–5.2)
Sodium: 140 mmol/L (ref 134–144)
Total Protein: 6.5 g/dL (ref 6.0–8.5)
eGFR: 94 mL/min/{1.73_m2} (ref >59)

## 2024-01-03 LAB — LIPID PANEL
Chol/HDL Ratio: 3.2 ratio (ref 0.0–4.4)
Cholesterol, Total: 199 mg/dL (ref 100–199)
HDL: 63 mg/dL (ref >39)
LDL Chol Calc (NIH): 124 mg/dL — ABNORMAL HIGH (ref 0–99)
Triglycerides: 63 mg/dL (ref 0–149)
VLDL Cholesterol Cal: 12 mg/dL (ref 5–40)

## 2024-01-03 LAB — CBC WITH DIFFERENTIAL/PLATELET
Basophils Absolute: 0.1 10*3/uL (ref 0.0–0.2)
Basos: 2 %
EOS (ABSOLUTE): 0.4 10*3/uL (ref 0.0–0.4)
Eos: 8 %
Hematocrit: 37.4 % (ref 34.0–46.6)
Hemoglobin: 11.1 g/dL (ref 11.1–15.9)
Immature Grans (Abs): 0 10*3/uL (ref 0.0–0.1)
Immature Granulocytes: 0 %
Lymphocytes Absolute: 1.7 10*3/uL (ref 0.7–3.1)
Lymphs: 37 %
MCH: 23.1 pg — ABNORMAL LOW (ref 26.6–33.0)
MCHC: 29.7 g/dL — ABNORMAL LOW (ref 31.5–35.7)
MCV: 78 fL — ABNORMAL LOW (ref 79–97)
Monocytes Absolute: 0.5 10*3/uL (ref 0.1–0.9)
Monocytes: 10 %
Neutrophils Absolute: 2 10*3/uL (ref 1.4–7.0)
Neutrophils: 43 %
Platelets: 263 10*3/uL (ref 150–450)
RBC: 4.81 x10E6/uL (ref 3.77–5.28)
RDW: 14.3 % (ref 11.7–15.4)
WBC: 4.7 10*3/uL (ref 3.4–10.8)

## 2024-01-03 LAB — HEMOGLOBIN A1C
Est. average glucose Bld gHb Est-mCnc: 114 mg/dL
Hgb A1c MFr Bld: 5.6 % (ref 4.8–5.6)

## 2024-01-03 LAB — TSH: TSH: 1.68 u[IU]/mL (ref 0.450–4.500)

## 2024-01-04 LAB — SPECIMEN STATUS REPORT

## 2024-01-04 LAB — IRON AND TIBC

## 2024-01-05 LAB — IRON AND TIBC
Iron Saturation: 5 — AB (ref 15–55)
Iron: 23 ug/dL — AB (ref 27–159)
UIBC: 407 ug/dL (ref 131–425)

## 2024-01-16 ENCOUNTER — Other Ambulatory Visit: Payer: Self-pay

## 2024-01-16 DIAGNOSIS — E611 Iron deficiency: Secondary | ICD-10-CM

## 2024-01-16 DIAGNOSIS — E039 Hypothyroidism, unspecified: Secondary | ICD-10-CM

## 2024-01-26 ENCOUNTER — Other Ambulatory Visit (HOSPITAL_BASED_OUTPATIENT_CLINIC_OR_DEPARTMENT_OTHER): Payer: Self-pay | Admitting: Family

## 2024-01-26 DIAGNOSIS — R002 Palpitations: Secondary | ICD-10-CM

## 2024-02-12 ENCOUNTER — Telehealth: Payer: Self-pay

## 2024-02-12 NOTE — Telephone Encounter (Signed)
 Called pt. Had to LM.   Copied from CRM #[id number]. Topic: Appointments - Scheduling Inquiry for Clinic >> Feb 12, 2024 12:30 PM Jasmin G wrote: Reason for CRM: Pt called to reschedule lab appt now on Sep 9th at 9:00 a.m, and she was inquiring about being able to get PTH testing and an iron  panel done on the same visit, please call her back ASAP to schedule at [phone]

## 2024-02-14 ENCOUNTER — Ambulatory Visit (INDEPENDENT_AMBULATORY_CARE_PROVIDER_SITE_OTHER): Admitting: Podiatry

## 2024-02-14 ENCOUNTER — Other Ambulatory Visit (HOSPITAL_BASED_OUTPATIENT_CLINIC_OR_DEPARTMENT_OTHER): Payer: Self-pay

## 2024-02-14 DIAGNOSIS — M19071 Primary osteoarthritis, right ankle and foot: Secondary | ICD-10-CM | POA: Diagnosis not present

## 2024-02-14 DIAGNOSIS — M19072 Primary osteoarthritis, left ankle and foot: Secondary | ICD-10-CM

## 2024-02-14 MED ORDER — MELOXICAM 15 MG PO TABS
15.0000 mg | ORAL_TABLET | Freq: Every day | ORAL | 0 refills | Status: DC
  Filled 2024-02-14: qty 30, 30d supply, fill #0

## 2024-02-14 MED ORDER — METHYLPREDNISOLONE 4 MG PO TBPK
ORAL_TABLET | ORAL | 0 refills | Status: DC
  Filled 2024-02-14: qty 21, 6d supply, fill #0

## 2024-02-14 NOTE — Progress Notes (Signed)
 Subjective:  Patient ID: Erica Hull, female    DOB: 03-May-1966,  MRN: [id number]  Chief Complaint  Patient presents with   Foot Pain    Pt stated that she has been getting random pains in the top of her feet she stated that it comes and goes no recent injuries     58 y.o. female presents with the above complaint.  Patient presents with complaint bilateral midfoot pain hurts with ambulation is with pressure she states to be causing a lot of discomfort she denies any injury.  Pain scale is 5 out of 10 dull aching nature she would like to discuss treatment options for this.  She would like to do steroid injection.   Review of Systems: Negative except as noted in the HPI. Denies N/V/F/Ch.  Past Medical History:  Diagnosis Date   Anemia    Constipation 04/28/2023   DUB (dysfunctional uterine bleeding) 01/29/2014   Elective abortion    X2   Enlarged aorta (HCC)    Fibroids, intramural 05/31/2011   H/O peripheral vascular disease 2006   Heart murmur    History of Helicobacter pylori infection 04/28/2023   Hx of cystitis 2006   Hypothyroidism    Iron  deficiency 09/19/2022   Loss of weight 04/28/2023   Missed ab     Current Outpatient Medications:    meloxicam  (MOBIC ) 15 MG tablet, Take 1 tablet (15 mg total) by mouth daily., Disp: 30 tablet, Rfl: 0   methylPREDNISolone  (MEDROL  DOSEPAK) 4 MG TBPK tablet, Take as directed, Disp: 21 each, Rfl: 0   B Complex-C-Folic Acid (SUPER B COMPLEX/FA/VIT C PO), Take 1 tablet by mouth daily., Disp: , Rfl:    Cholecalciferol (D3-1000) 25 MCG (1000 UT) tablet, , Disp: , Rfl:    Cyanocobalamin (VITAMIN B 12 PO), Take 1,000 mcg by mouth daily., Disp: , Rfl:    Ginkgo Biloba 120 MG CAPS, Take 1 tablet by mouth daily., Disp: , Rfl:    levothyroxine  (SYNTHROID ) 88 MCG tablet, TAKE 1 TABLET BY MOUTH 5 DAYS  PER WEEK AND 1 AND 1/2 TABLETS  BY MOUTH 2 DAYS WEEKLY, Disp: 100 tablet, Rfl: 3   metoprolol  succinate (TOPROL -XL) 25 MG 24 hr tablet, Take 1  tablet (25 mg total) by mouth daily. PLEASE CALL [phone] TO SCHEDULE YEARLY VISIT PRIOR TO NEXT REFILL REQUEST. THANK YOU., Disp: 30 tablet, Rfl: 0   omeprazole (PRILOSEC) 20 MG capsule, Take 20 mg by mouth daily., Disp: , Rfl:    ondansetron  (ZOFRAN ) 8 MG tablet, Take 1 tablet (8 mg total) by mouth every 8 (eight) hours as needed for nausea or vomiting., Disp: 20 tablet, Rfl: 3   Rhubarb (ESTROVEN COMPLETE PO), Take 1 capsule by mouth daily., Disp: , Rfl:    Semaglutide -Weight Management (WEGOVY ) 2.4 MG/0.75ML SOAJ, Inject 2.4 mg into the skin once a week., Disp: 9 mL, Rfl: 3   TURMERIC CURCUMIN PO, Take 2,250 mg by mouth daily., Disp: , Rfl:    vitamin E 400 UNIT capsule, Take 400 Units by mouth daily., Disp: , Rfl:   Social History   Tobacco Use  Smoking Status Never  Smokeless Tobacco Never    No Known Allergies Objective:  There were no vitals filed for this visit. There is no height or weight on file to calculate BMI. Constitutional Well developed. Well nourished.  Vascular Dorsalis pedis pulses palpable bilaterally. Posterior tibial pulses palpable bilaterally. Capillary refill normal to all digits.  No cyanosis or clubbing noted. Pedal hair growth normal.  Neurologic Normal speech. Oriented to person, place, and time. Epicritic sensation to light touch grossly present bilaterally.  Dermatologic Nails well groomed and normal in appearance. No open wounds. No skin lesions.  Orthopedic: Pain on palpation of bilateral midfoot pain with range of motion of the tarsometatarsal joint pain across the midfoot joint.  No pain at the Lisfranc interval. Clinically able to palpate the midfoot arthritis without event jagged edges   Radiographs: None Assessment:   1. Arthritis of right midfoot   2. Arthritis of left midfoot    Plan:  Patient was evaluated and treated and all questions answered.  Bilateral midfoot arthritis - All questions or concerns were discussed with the  patient in extensive detail given the amount of pain that she is experiencing she would benefit from a steroid injection to help decrease inflammatory component surgical pain.  Patient agrees with plan elected proceed with steroid injection. -A steroid injection was performed at bilateral midfoot using 1% plain Lidocaine  and 10 mg of Kenalog . This was well tolerated.   No follow-ups on file.

## 2024-02-29 ENCOUNTER — Other Ambulatory Visit (HOSPITAL_COMMUNITY): Payer: Self-pay

## 2024-02-29 ENCOUNTER — Other Ambulatory Visit (HOSPITAL_BASED_OUTPATIENT_CLINIC_OR_DEPARTMENT_OTHER): Payer: Self-pay

## 2024-03-01 ENCOUNTER — Telehealth: Payer: Self-pay

## 2024-03-01 NOTE — Telephone Encounter (Signed)
 Pharmacy Patient Advocate Encounter  Received notification from [employer] that Prior Authorization for WEGOVY  2.4MG /0.5ML has been APPROVED to 8.21.26. Ran test claim, . This test claim was processed through [facility]- copay amounts may vary at other pharmacies due to pharmacy/plan contracts, or as the patient moves through the different stages of their insurance plan.   PA #/Case ID/Reference #: [id number]

## 2024-03-05 ENCOUNTER — Other Ambulatory Visit (HOSPITAL_BASED_OUTPATIENT_CLINIC_OR_DEPARTMENT_OTHER): Payer: Self-pay

## 2024-03-11 ENCOUNTER — Other Ambulatory Visit (HOSPITAL_BASED_OUTPATIENT_CLINIC_OR_DEPARTMENT_OTHER): Payer: Self-pay | Admitting: Family

## 2024-03-11 DIAGNOSIS — R002 Palpitations: Secondary | ICD-10-CM

## 2024-03-19 ENCOUNTER — Other Ambulatory Visit (INDEPENDENT_AMBULATORY_CARE_PROVIDER_SITE_OTHER)

## 2024-03-19 DIAGNOSIS — E039 Hypothyroidism, unspecified: Secondary | ICD-10-CM

## 2024-03-19 DIAGNOSIS — E611 Iron deficiency: Secondary | ICD-10-CM

## 2024-03-19 DIAGNOSIS — Z23 Encounter for immunization: Secondary | ICD-10-CM | POA: Diagnosis not present

## 2024-03-20 ENCOUNTER — Ambulatory Visit: Payer: Self-pay | Admitting: Nurse Practitioner

## 2024-03-20 LAB — IRON AND TIBC
Iron Saturation: 17 % (ref 15–55)
Iron: 61 ug/dL (ref 27–159)
Total Iron Binding Capacity: 361 ug/dL (ref 250–450)
UIBC: 300 ug/dL (ref 131–425)

## 2024-03-20 LAB — FERRITIN: Ferritin: 20 ng/mL (ref 15–150)

## 2024-03-20 LAB — IRON,TIBC AND FERRITIN PANEL
Ferritin: 19 ng/mL (ref 15–150)
Iron Saturation: 17 % (ref 15–55)
Iron: 60 ug/dL (ref 27–159)
Total Iron Binding Capacity: 360 ug/dL (ref 250–450)
UIBC: 300 ug/dL (ref 131–425)

## 2024-03-20 LAB — PARATHYROID HORMONE, INTACT (NO CA): PTH: 28 pg/mL (ref 15–65)

## 2024-03-25 ENCOUNTER — Ambulatory Visit (HOSPITAL_BASED_OUTPATIENT_CLINIC_OR_DEPARTMENT_OTHER): Admitting: Family

## 2024-03-26 ENCOUNTER — Other Ambulatory Visit

## 2024-05-13 ENCOUNTER — Ambulatory Visit (HOSPITAL_BASED_OUTPATIENT_CLINIC_OR_DEPARTMENT_OTHER): Admitting: Family

## 2024-05-13 VITALS — BP 112/72 | HR 61 | Ht 67.0 in | Wt 197.0 lb

## 2024-05-13 DIAGNOSIS — I10 Essential (primary) hypertension: Secondary | ICD-10-CM | POA: Diagnosis not present

## 2024-05-13 DIAGNOSIS — R002 Palpitations: Secondary | ICD-10-CM

## 2024-05-13 NOTE — Progress Notes (Signed)
 Cardiology Office Note:  .   Date:  05/13/2024  ID:  Erica Hull, DOB [birth year], MRN [id number] PCP: Oris Camie BRAVO, NP  [facility] Providers Cardiologist:  None Cardiology APP:  Vannie Reche RAMAN, NP    History of Present Illness: .   Erica Hull is a 58 y.o. 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   She previously followed with Katheryn Jude, NP.  Katheryn also cared for her father and mother.  She wore a monitor due to palpitations and had an echocardiogram due to a previously reported murmur.  Monitor revealed 2 runs of NSVT and metoprolol  added with improvement in symptoms.  Echocardiogram 03/2019 showed normal LVEF 60 to 65%, mild dilation ascending aorta 40 mm.  Subsequent echo 03/2019 with ascending thoracic aorta maximum measured transverse diameter 3.8 x 3.1 cm, small hiatal hernia.  Given aorta prominent in size according to age plan for repeat study in 2 years.   Seen 07/2021 doing well from cardiac perspective. Updated CT aorta 08/20/2021 with stable 3.8 cm ascending thoracic aorta.  No follow-up imaging recommended.  She was last seen 02/2023 with successful weight loss of 40 lbs on Wegovy . She noted intermittent palpitations and was advised to take extra half tablet of Toprol  PRN.  Presents today for follow up. She is doing well from a cardiac standpoint, denies chest pain, shortness of breath, lightheadedness, dizziness. Has occasional palpitations but self limiting and has not required additional PRN Metoprolol . Has been using walking pad and felt pulling sensation in chest, not limiting exercise. This does not occur with walking for leisure or walking 30k+ steps at [place]. Discussed may be related to posture. She has LE edema at baseline, with h/o venous ablation, unchanged from prior. She is looking forward to a trip to [facility] in May with her family. Lab work monitored by PCP.   ROS: Please see the history of present  illness.    All other systems reviewed and are negative.   Studies Reviewed: SABRA   EKG Interpretation Date/Time:  Monday May 13 2024 13:05:30 EST Ventricular Rate:  61 PR Interval:  134 QRS Duration:  86 QT Interval:  388 QTC Calculation: 390 R Axis:   27  Text Interpretation: Normal sinus rhythm Normal ECG Confirmed by Vannie Reche ([id number]) on 05/13/2024 1:11:17 PM    Cardiac Studies & Procedures   ______________________________________________________________________________________________     ECHOCARDIOGRAM  ECHOCARDIOGRAM COMPLETE 03/22/2019  Narrative ECHOCARDIOGRAM REPORT    Patient Name:   Erica Hull Date of Exam: 03/22/2019 Medical Rec #:  [id number]       Height:       67.0 in Accession #:    [id number]      Weight:       215.0 lb Date of Birth:  1965-10-23      BSA:          2.09 m Patient Age:    52 years        BP:           125/66 mmHg Patient Gender: F               HR:           66 bpm. Exam Location:  [address]   Procedure: 2D Echo, 3D Echo, Cardiac Doppler, Color Doppler and Strain Analysis  Indications:    R01.1 Murmur R00.2 Palpitations  History:        Patient  has no prior history of Echocardiogram examinations. Risk Factors: Hypertension. Murmur. Hypothyroidism.  Sonographer:    NaTashia Rodgers-Jones RDCS Referring Phys: [id number] LORI C GERHARDT  IMPRESSIONS   1. The left ventricle has normal systolic function with an ejection fraction of 60-65%. The cavity size was normal. Left ventricular diastolic parameters were normal. No evidence of left ventricular regional wall motion abnormalities. 2. The right ventricle has normal systolic function. The cavity was normal. There is no increase in right ventricular wall thickness. Right ventricular systolic pressure could not be assessed. 3. Trivial pericardial effusion is present. 4. No evidence of mitral valve stenosis. 5. The aortic valve is tricuspid. No stenosis of the aortic valve. 6.  The aorta is normal unless otherwise noted. 7. There is mild dilatation of the ascending aorta measuring 40 mm.  FINDINGS Left Ventricle: The left ventricle has normal systolic function, with an ejection fraction of 60-65%. The cavity size was normal. There is no increase in left ventricular wall thickness. Left ventricular diastolic parameters were normal. No evidence of left ventricular regional wall motion abnormalities..  Right Ventricle: The right ventricle has normal systolic function. The cavity was normal. There is no increase in right ventricular wall thickness. Right ventricular systolic pressure could not be assessed.  Left Atrium: Left atrial size was normal in size.  Right Atrium: Right atrial size was normal in size.  Interatrial Septum: No atrial level shunt detected by color flow Doppler.  Pericardium: Trivial pericardial effusion is present.  Mitral Valve: The mitral valve is normal in structure. Mitral valve regurgitation is mild by color flow Doppler. No evidence of mitral valve stenosis.  Tricuspid Valve: The tricuspid valve is normal in structure. Tricuspid valve regurgitation was not visualized by color flow Doppler.  Aortic Valve: The aortic valve is tricuspid Aortic valve regurgitation was not visualized by color flow Doppler. There is No stenosis of the aortic valve.  Pulmonic Valve: The pulmonic valve was grossly normal. Pulmonic valve regurgitation is not visualized by color flow Doppler. No evidence of pulmonic stenosis.  Aorta: The aorta is normal unless otherwise noted. There is mild dilatation of the ascending aorta measuring 40 mm.  Pulmonary Artery: The pulmonary artery is of normal size and origin.  Venous: The inferior vena cava is normal in size with greater than 50% respiratory variability.   +--------------+--------++ LEFT VENTRICLE         +----------------+---------++ +--------------+--------++ Diastology                PLAX 2D                 +----------------+---------++ +--------------+--------++ LV e' lateral:  9.57 cm/s LVIDd:        4.80 cm  +----------------+---------++ +--------------+--------++ LV E/e' lateral:9.3       LVIDs:        3.30 cm  +----------------+---------++ +--------------+--------++ LV e' medial:   7.72 cm/s LV PW:        0.90 cm  +----------------+---------++ +--------------+--------++ LV E/e' medial: 11.5      LV IVS:       0.80 cm  +----------------+---------++ +--------------+--------++ LVOT diam:    1.90 cm  +----------------------+-------++ +--------------+--------++ 2D Longitudinal Strain        LV SV:        63 ml    +----------------------+-------++ +--------------+--------++ 2D Strain GLS (A2C):  -18.0 % LV SV Index:  29.04    +----------------------+-------++ +--------------+--------++ 2D Strain GLS (A3C):  -18.7 % LVOT Area:    2.84 cm +----------------------+-------++ +--------------+--------++  2D Strain GLS (A4C):  -18.5 %                        +----------------------+-------++ +--------------+--------++ 2D Strain GLS Avg:    -18.4 % +----------------------+-------++  +-------------+------++ 3D Volume EF:       +-------------+------++ 3D EF:       63 %   +-------------+------++ LV EDV:      129 ml +-------------+------++ LV ESV:      48 ml  +-------------+------++ LV SV:       81 ml  +-------------+------++  +---------------+----------++ RIGHT VENTRICLE           +---------------+----------++ RV Basal diam: 3.80 cm    +---------------+----------++ RV S prime:    15.55 cm/s +---------------+----------++ TAPSE (M-mode):2.2 cm     +---------------+----------++  +---------------+-------++-----------++ LEFT ATRIUM           Index       +---------------+-------++-----------++ LA diam:       3.90 cm1.87 cm/m   +---------------+-------++-----------++ LA Vol (A2C):  50.3 ml24.12 ml/m +---------------+-------++-----------++ LA Vol (A4C):  47.6 ml22.83 ml/m +---------------+-------++-----------++ LA Biplane Vol:50.8 ml24.36 ml/m +---------------+-------++-----------++ +------------+---------++-----------++ RIGHT ATRIUM         Index       +------------+---------++-----------++ RA Pressure:3.00 mmHg            +------------+---------++-----------++ RA Area:    11.80 cm            +------------+---------++-----------++ RA Volume:  27.90 ml 13.38 ml/m +------------+---------++-----------++ +------------+-----------++ AORTIC VALVE            +------------+-----------++ LVOT Vmax:  82.45 cm/s  +------------+-----------++ LVOT Vmean: 60.850 cm/s +------------+-----------++ LVOT VTI:   0.190 m     +------------+-----------++  +-------------+-------++ AORTA                +-------------+-------++ Ao Root diam:3.20 cm +-------------+-------++ Ao Asc diam: 4.00 cm +-------------+-------++  +--------------+----------++  +---------------+---------++ MITRAL VALVE              TRICUSPID VALVE          +--------------+----------++  +---------------+---------++ MV Area (PHT):3.53 cm    Estimated RAP: 3.00 mmHg +--------------+----------++  +---------------+---------++ MV PHT:       62.35 msec +--------------+----------++  +--------------+-------+ MV Decel Time:215 msec    SHUNTS                +--------------+----------++  +--------------+-------+ +--------------+-----------++ Systemic VTI: 0.19 m  MV E velocity:88.80 cm/s  +--------------+-------+ +--------------+-----------++ Systemic Diam:1.90 cm MV A velocity:103.00 cm/s +--------------+-------+ +--------------+-----------++ MV E/A ratio: 0.86        +--------------+-----------++   Shelda Bruckner  MD Electronically signed by Shelda Bruckner MD Signature Date/Time: 03/22/2019/6:10:02 PM    Final    MONITORS  LONG TERM MONITOR (3-14 DAYS) 04/15/2019  Narrative  Basic rhythm is NSR  Two non sustained SVT episodes, longet 11 beats not identified by patient  Triggered events did not correlated with rhythm disturbance.  Rare PVC's are noted.       ______________________________________________________________________________________________        Risk Assessment/Calculations:             Physical Exam:   VS:  BP 112/72 (BP Location: Left Arm, Patient Position: Sitting, Cuff Size: Normal)   Pulse 61   Ht 5' 7 (1.702 m)   Wt 197 lb (89.4 kg)   LMP 04/09/2021 (Exact Date) Comment: sexually active,btl  SpO2 93%   BMI 30.85 kg/m     Wt Readings  from Last 3 Encounters:  05/13/24 197 lb (89.4 kg)  01/02/24 197 lb 6.4 oz (89.5 kg)  11/21/23 193 lb 9.6 oz (87.8 kg)    GEN: Well nourished, well developed in no acute distress NECK: No JVD; No carotid bruits CARDIAC: RRR, no murmurs, rubs, gallops; pulses 2+ radial, PT bilaterally  RESPIRATORY:  Clear to auscultation without rales, wheezing or rhonchi  ABDOMEN: Soft, non-tender, non-distended EXTREMITIES:  No edema; No deformity   ASSESSMENT AND PLAN: .    Palpitations - stable, controlled on toprol  XL 25mg  daily; EKG today: NSR @ 61bpm Continue therapy as above; may take additional half-tablet of toprol  XL prn breakthrough palpitations Hydrate adequately, limit caffeine intake   Prominent aorta / Ascending aorta dilation - Echo 03/2019 40 mm. CT 03/2019 transverse diameter 3.8X 3.8 cm.  High normal for age.  Repeat CT 09/2021 with stable 3.8 cm dilation.  Not recommended for repeat imaging.  Continue optimal BP control.    HTN, goal <130/80 - stable and controlled on toprol  XL 25mg  daily, continue same. Recommend aiming for 150 minutes weekly of moderate intensity physical activity  Recommend heart-healthy,  low sodium diet   Obesity / dyslipidemia - weight trends stable; on wegovy  2.4mg /ml weekly; labs 12/2023: LDL 124 The 10-year ASCVD risk score (Arnett DK, et al., 2019) is: 2.2% . No indication for lipid lowering therapy recommended. Continued heart healthy lifestyle encouraged.   Hypothyroidism - Continue to follow with PCP.     Dispo: follow up as needed with cardiology  Signed, Reche GORMAN Finder, NP

## 2024-05-13 NOTE — Patient Instructions (Signed)
 Medication Instructions:   Your physician recommends that you continue on your current medications as directed. Please refer to the Current Medication list given to you today.    Follow-Up:  As needed with Reche Finder, NP

## 2024-05-18 ENCOUNTER — Encounter (HOSPITAL_BASED_OUTPATIENT_CLINIC_OR_DEPARTMENT_OTHER): Payer: Self-pay | Admitting: Family

## 2024-05-19 ENCOUNTER — Other Ambulatory Visit (HOSPITAL_BASED_OUTPATIENT_CLINIC_OR_DEPARTMENT_OTHER): Payer: Self-pay | Admitting: Family

## 2024-05-19 DIAGNOSIS — R002 Palpitations: Secondary | ICD-10-CM

## 2025-01-21 ENCOUNTER — Encounter: Payer: Self-pay | Admitting: Nurse Practitioner
